# Patient Record
Sex: Female | Born: 1940
Health system: Southern US, Community
[De-identification: ages and names within clinical notes are randomized; demographics above are authoritative.]

## PROBLEM LIST (undated history)

## (undated) DIAGNOSIS — H35039 Hypertensive retinopathy, unspecified eye: Secondary | ICD-10-CM

## (undated) DIAGNOSIS — Z720 Tobacco use: Secondary | ICD-10-CM

## (undated) DIAGNOSIS — H348122 Central retinal vein occlusion, left eye, stable: Secondary | ICD-10-CM

## (undated) DIAGNOSIS — H3530 Unspecified macular degeneration: Secondary | ICD-10-CM

## (undated) DIAGNOSIS — R739 Hyperglycemia, unspecified: Secondary | ICD-10-CM

## (undated) DIAGNOSIS — K635 Polyp of colon: Secondary | ICD-10-CM

## (undated) DIAGNOSIS — I10 Essential (primary) hypertension: Secondary | ICD-10-CM

## (undated) DIAGNOSIS — E785 Hyperlipidemia, unspecified: Secondary | ICD-10-CM

## (undated) DIAGNOSIS — C801 Malignant (primary) neoplasm, unspecified: Secondary | ICD-10-CM

## (undated) HISTORY — DX: Hyperglycemia, unspecified: R73.9

## (undated) HISTORY — PX: CATARACT EXTRACTION, BILATERAL: SHX1313

## (undated) HISTORY — DX: Central retinal vein occlusion, left eye, stable: H34.8122

## (undated) HISTORY — DX: Malignant (primary) neoplasm, unspecified: C80.1

## (undated) HISTORY — PX: OTHER SURGICAL HISTORY: SHX169

## (undated) HISTORY — DX: Essential (primary) hypertension: I10

## (undated) HISTORY — DX: Hyperlipidemia, unspecified: E78.5

## (undated) HISTORY — DX: Unspecified macular degeneration: H35.30

## (undated) HISTORY — PX: CHOLECYSTECTOMY: SHX55

## (undated) HISTORY — DX: Tobacco use: Z72.0

## (undated) HISTORY — DX: Hypertensive retinopathy, unspecified eye: H35.039

## (undated) HISTORY — DX: Polyp of colon: K63.5

---

## 1998-07-24 DIAGNOSIS — C801 Malignant (primary) neoplasm, unspecified: Secondary | ICD-10-CM

## 1998-07-24 DIAGNOSIS — Z923 Personal history of irradiation: Secondary | ICD-10-CM

## 1998-07-24 DIAGNOSIS — C50919 Malignant neoplasm of unspecified site of unspecified female breast: Secondary | ICD-10-CM

## 1998-07-24 HISTORY — PX: BREAST LUMPECTOMY: SHX2

## 1998-07-24 HISTORY — PX: BREAST BIOPSY: SHX20

## 1998-07-24 HISTORY — DX: Personal history of irradiation: Z92.3

## 1998-07-24 HISTORY — DX: Malignant neoplasm of unspecified site of unspecified female breast: C50.919

## 1998-07-24 HISTORY — DX: Malignant (primary) neoplasm, unspecified: C80.1

## 2007-08-05 LAB — HM COLONOSCOPY: HM Colonoscopy: NORMAL

## 2007-09-09 LAB — CONVERTED CEMR LAB: Pap Smear: NORMAL

## 2008-07-31 ENCOUNTER — Encounter: Payer: Self-pay | Admitting: Family

## 2009-06-03 ENCOUNTER — Ambulatory Visit: Payer: Self-pay | Admitting: Family

## 2009-06-03 ENCOUNTER — Telehealth: Payer: Self-pay | Admitting: Family

## 2009-06-03 DIAGNOSIS — F17200 Nicotine dependence, unspecified, uncomplicated: Secondary | ICD-10-CM

## 2009-06-03 DIAGNOSIS — M810 Age-related osteoporosis without current pathological fracture: Secondary | ICD-10-CM

## 2009-06-03 DIAGNOSIS — Z853 Personal history of malignant neoplasm of breast: Secondary | ICD-10-CM | POA: Insufficient documentation

## 2009-06-03 LAB — CONVERTED CEMR LAB
BUN: 15 mg/dL (ref 6–23)
Basophils Absolute: 0 10*3/uL (ref 0.0–0.1)
CO2: 28 meq/L (ref 19–32)
Calcium: 9.4 mg/dL (ref 8.4–10.5)
Chloride: 106 meq/L (ref 96–112)
Creatinine, Ser: 0.9 mg/dL (ref 0.4–1.2)
Direct LDL: 192.5 mg/dL
Eosinophils Absolute: 0 10*3/uL (ref 0.0–0.7)
Glucose, Bld: 98 mg/dL (ref 70–99)
HDL: 52.5 mg/dL (ref >39.00)
Lymphocytes Relative: 27.3 % (ref 12.0–46.0)
MCHC: 34.5 g/dL (ref 30.0–36.0)
MCV: 96.8 fL (ref 78.0–100.0)
Monocytes Absolute: 0.4 10*3/uL (ref 0.1–1.0)
Neutrophils Relative %: 66.1 % (ref 43.0–77.0)
Platelets: 179 10*3/uL (ref 150.0–400.0)
RDW: 12.2 % (ref 11.5–14.6)
Triglycerides: 83 mg/dL (ref 0.0–149.0)

## 2009-06-04 ENCOUNTER — Telehealth (INDEPENDENT_AMBULATORY_CARE_PROVIDER_SITE_OTHER): Payer: Self-pay | Admitting: *Deleted

## 2009-06-23 ENCOUNTER — Ambulatory Visit: Payer: Self-pay | Admitting: Family

## 2009-06-23 DIAGNOSIS — E785 Hyperlipidemia, unspecified: Secondary | ICD-10-CM | POA: Insufficient documentation

## 2009-06-23 DIAGNOSIS — I10 Essential (primary) hypertension: Secondary | ICD-10-CM

## 2009-06-23 LAB — CONVERTED CEMR LAB
ALT: 10 units/L (ref 0–35)
AST: 12 units/L (ref 0–37)
Albumin: 3.8 g/dL (ref 3.5–5.2)
Alkaline Phosphatase: 51 units/L (ref 39–117)

## 2009-06-24 ENCOUNTER — Encounter: Payer: Self-pay | Admitting: Family

## 2009-08-12 ENCOUNTER — Encounter: Payer: Self-pay | Admitting: Family

## 2009-12-28 ENCOUNTER — Ambulatory Visit: Payer: Self-pay | Admitting: Family

## 2010-01-11 ENCOUNTER — Encounter: Payer: Self-pay | Admitting: Family

## 2010-01-11 LAB — CONVERTED CEMR LAB
Alkaline Phosphatase: 52 units/L (ref 39–117)
CO2: 26 meq/L (ref 19–32)
Cholesterol: 150 mg/dL (ref 0–200)
Creatinine, Ser: 0.87 mg/dL (ref 0.40–1.20)
Glucose, Bld: 108 mg/dL — ABNORMAL HIGH (ref 70–99)
LDL Cholesterol: 82 mg/dL (ref 0–99)
Sodium: 145 meq/L (ref 135–145)
Total Bilirubin: 0.4 mg/dL (ref 0.3–1.2)
Total CHOL/HDL Ratio: 2.8
Total Protein: 6.7 g/dL (ref 6.0–8.3)
Triglycerides: 74 mg/dL (ref <150)
VLDL: 15 mg/dL (ref 0–40)

## 2010-01-12 ENCOUNTER — Encounter: Payer: Self-pay | Admitting: Family

## 2010-03-07 ENCOUNTER — Ambulatory Visit: Payer: Self-pay | Admitting: Family

## 2010-03-07 ENCOUNTER — Ambulatory Visit: Payer: Self-pay | Admitting: Hematology & Oncology

## 2010-03-07 DIAGNOSIS — M129 Arthropathy, unspecified: Secondary | ICD-10-CM | POA: Insufficient documentation

## 2010-03-07 DIAGNOSIS — H9190 Unspecified hearing loss, unspecified ear: Secondary | ICD-10-CM | POA: Insufficient documentation

## 2010-03-08 ENCOUNTER — Ambulatory Visit: Payer: Self-pay | Admitting: Hematology & Oncology

## 2010-03-09 ENCOUNTER — Encounter: Payer: Self-pay | Admitting: Family

## 2010-03-09 ENCOUNTER — Ambulatory Visit: Payer: Self-pay | Admitting: Internal Medicine

## 2010-03-10 ENCOUNTER — Encounter: Payer: Self-pay | Admitting: Internal Medicine

## 2010-03-21 ENCOUNTER — Telehealth: Payer: Self-pay | Admitting: Family

## 2010-03-30 ENCOUNTER — Telehealth: Payer: Self-pay | Admitting: Family

## 2010-03-31 ENCOUNTER — Telehealth: Payer: Self-pay | Admitting: Family

## 2010-04-01 ENCOUNTER — Ambulatory Visit: Payer: Self-pay | Admitting: Internal Medicine

## 2010-04-01 DIAGNOSIS — J018 Other acute sinusitis: Secondary | ICD-10-CM

## 2010-04-29 ENCOUNTER — Ambulatory Visit: Payer: Self-pay | Admitting: Hematology & Oncology

## 2010-05-02 ENCOUNTER — Ambulatory Visit: Payer: Self-pay | Admitting: Family

## 2010-05-02 LAB — CBC WITH DIFFERENTIAL (CANCER CENTER ONLY)
BASO%: 0.5 % (ref 0.0–2.0)
EOS%: 1 % (ref 0.0–7.0)
HCT: 39.3 % (ref 34.8–46.6)
LYMPH%: 26.5 % (ref 14.0–48.0)
MCH: 31.4 pg (ref 26.0–34.0)
MCHC: 33.5 g/dL (ref 32.0–36.0)
MCV: 94 fL (ref 81–101)
MONO%: 5.3 % (ref 0.0–13.0)
NEUT%: 66.7 % (ref 39.6–80.0)
Platelets: 214 10*3/uL (ref 145–400)
RDW: 11.1 % (ref 10.5–14.6)

## 2010-05-02 LAB — COMPREHENSIVE METABOLIC PANEL
ALT: <8 U/L (ref 0–35)
AST: 11 U/L (ref 0–37)
Alkaline Phosphatase: 53 U/L (ref 39–117)
CO2: 25 mEq/L (ref 19–32)
Creatinine, Ser: 0.81 mg/dL (ref 0.40–1.20)
Sodium: 142 mEq/L (ref 135–145)
Total Bilirubin: 0.5 mg/dL (ref 0.3–1.2)

## 2010-05-10 LAB — BASIC METABOLIC PANEL - CANCER CENTER ONLY
BUN, Bld: 19 mg/dL (ref 7–22)
CO2: 28 mEq/L (ref 18–33)
Calcium: 8.7 mg/dL (ref 8.0–10.3)
Glucose, Bld: 113 mg/dL (ref 73–118)
Sodium: 146 mEq/L — ABNORMAL HIGH (ref 128–145)

## 2010-06-27 ENCOUNTER — Ambulatory Visit: Payer: Self-pay | Admitting: Family

## 2010-08-12 LAB — HM MAMMOGRAPHY: HM Mammogram: NORMAL

## 2010-08-23 NOTE — Assessment & Plan Note (Signed)
Summary: flu shot/mhf  Nurse Visit   Vital Signs:  Patient profile:   70 year old female Temp:     97.9 degrees F oral  Vitals Entered By: Mervin Kung CMA Duncan Dull) (May 02, 2010 2:07 PM)  Allergies: No Known Drug Allergies  Orders Added: 1)  Flu Vaccine 26yrs + MEDICARE PATIENTS [Q2039] 2)  Administration Flu vaccine - MCR [G0008]  Flu Vaccine Consent Questions     Do you have a history of severe allergic reactions to this vaccine? no    Any prior history of allergic reactions to egg and/or gelatin? no    Do you have a sensitivity to the preservative Thimersol? no    Do you have a past history of Guillan-Barre Syndrome? no    Do you currently have an acute febrile illness? no    Have you ever had a severe reaction to latex? no    Vaccine information given and explained to patient? yes    Are you currently pregnant? no    Lot Number:[id number]   Exp Date:01/21/2011   Site Given  Left Deltoid IM.  Nicki Guadalajara Fergerson CMA Duncan Dull)  May 02, 2010 2:07 PM

## 2010-08-23 NOTE — Consult Note (Signed)
Summary: [facility]  [facility]   Imported By: Lanelle Bal 03/18/2010 10:30:56  _____________________________________________________________________  External Attachment:    Type:   Image     Comment:   External Document

## 2010-08-23 NOTE — Assessment & Plan Note (Signed)
Summary: medicare wellness exam / tf,cma   Vital Signs:  Patient profile:   70 year old female Height:      60.5 inches (153.67 cm) Weight:      141.25 pounds (64.20 kg) BMI:     27.23 Temp:     98.1 degrees F (36.72 degrees C) oral BP sitting:   118 / 68  (right arm) Cuff size:   regular  Vitals Entered By: Brenton Grills MA (March 07, 2010 8:14 AM) CC: Medicare Wellness Exam/aj Is Patient Diabetic? No Pain Assessment Patient in pain? yes     Location: foot Intensity: 7 Type: aching Nutritional Status BMI of 25 - 29 = overweight Nutritional Status Detail Trying to eat a healthy diet Domestic Violence Intervention denies  Does patient need assistance? Functional Status Self care, Cook/clean, Shopping, Social activities Ambulation Normal Comments Pt is no longer usig Nicoderm patch  Vision Screening:Left eye with correction: 20 / 40 Right eye with correction: 20 / 30 Both eyes with correction: 20 / 40        Vision Entered By: Brenton Grills MA (March 07, 2010 8:50 AM)   CC:  Medicare Wellness Exam/aj.  History of Present Illness: Judith Flynn is a 70 year old female who presents today for her medicare wellness exam.  Here for Medicare AWV: last eye exam was may  1.   Risk factors based on Past M, S, F history: + history of breast CA- she was seeing Dr. Waldemar Dickens (oncology) in [place]- but has not established with a local oncologist, was seeing (Dr Lawerance Bach) GI for colonoscopies + hx or polyps- due for colo next year (gastroenterology), she will consider varivax 2.   Physical Activities: walks the dog- no formal exercise, but plans to look into "silver sneakers" with the [employer] 3.   Depression/mood: Reports "down at times" but usually mood is good.   4.   Hearing: patient wears two hearing aids- has difficult time with a soft tone (difficulty hearing soft speech at 4 feet) cognitive impairment-  oriented to person,  year, place, denies difficulty with reading or writing.   Good 3 minute recall 5.   ADL's:  perfoms ADL's without problems.  6.   Fall Risk: none 7.   Home Safety: no issues 8.   Height, weight, &visual acuity:  see vitals 9.   Counseling: patient was counseled on diet, exercise, weight loss,  and smoking cessation 10.   Labs ordered based on risk factors: + hx or osteoporosis- check vitamin D, + joint pain- will check uric acid.  11.           Referral Coordination-  will plan referral to oncology for monitoring (Ennever)  Will also send for  audiology evaluation 12.           Care Plan- will repeat dexascan due to hx of osteoporosis (last dexa was 2 years ago) Also check vitamin D, refer to Oncology for ongoing follow up given history of breast CA (2000).  Will arrange follow up with Audiologist due to hearing difficulty.  Pt to schedule a follow up apt for Pap smear.  13.            Cognitive Assessment- normal functioning for age.  Denies difficulty with reading or writing,    Preventive Screening-Counseling & Management  Alcohol-Tobacco     Tobacco Counseling: to quit use of tobacco products  Comments: using 10 cigarettes a day  Current Medications (verified): 1)  Alendronate Sodium 70 Mg  Tabs (Alendronate Sodium) .Marland Kitchen.. 1 Per Week By Mouth 2)  Omeprazole 20 Mg Cpdr (Omeprazole) .Marland Kitchen.. 1-2 Daily By Mouth 3)  Calcium 600 Mg Tabs (Calcium) .... 2 Per Day By Mouth 4)  Crestor 20 Mg Tabs (Rosuvastatin Calcium) .... One Tablet By Mouth At Bedtime 5)  Zestril 10 Mg Tabs (Lisinopril) .... One Tab By Mouth Daily 6)  Nicoderm Cq 21 Mg/24hr Pt24 (Nicotine) .... Apply One Patch Daily  Allergies (verified): No Known Drug Allergies  Past History:  Past Surgical History: breast biopsy in 2000 surgery on R ear drum  Lumpectomy left 2000  Family History: Reviewed history from 06/03/2009 and no changes required. Family History of Alcoholism/Addiction-parents Family History of Arthritis-parents Family History Hypertension-parents both in their  78's Mom- Htn, CHF, died age 89 Dad- MI died 33  Review of Systems       Constitutional: Denies Fever ENT:  Denies nasal congestion or sore throat. Resp: Denies cough CV:  Denies Chest Pain GI:  Denies nausea or vomitting GU: Denies dysuria Lymphatic: Denies lymphadenopathy Musculoskeletal: some pain in her hands and base of right great toe Skin:  Denies Rashes Psychiatric: Denies depression Neuro: Denies numbness     Physical Exam  General:  Well-developed,well-nourished,in no acute distress; alert,appropriate and cooperative throughout examination Lungs:  Normal respiratory effort, chest expands symmetrically. Lungs are clear to auscultation, no crackles or wheezes. Heart:  Normal rate and regular rhythm. S1 and S2 normal without gallop, murmur, click, rub or other extra sounds. Abdomen:  Bowel sounds positive,abdomen soft and non-tender without masses, organomegaly or hernias noted. Neurologic:  alert & oriented X3 and cranial nerves II-XII intact.  Difficulty hearing soft spoken voice in exam room.     Impression & Recommendations:  Problem # 1:  OSTEOPOROSIS (ICD-733.00) Assessment Comment Only Continue Fosamax,  check vitamin D level, pt to schedule follow up dexascan Her updated medication list for this problem includes:    Alendronate Sodium 70 Mg Tabs (Alendronate sodium) .Marland Kitchen... 1 per week by mouth  Orders: T-Vitamin D (25-Hydroxy) [phone])  Problem # 2:  PERSONAL HISTORY OF MALIGNANT NEOPLASM OF BREAST (ICD-V10.3) Assessment: Comment Only  Pt was following with oncology in [place].  Had reportedly normal mammogram in January of this year.  Will refer to Dr. Myna Hidalgo to establish with local oncologist.  Orders: Oncology Referral (Oncology)  Problem # 3:  UNSPECIFIED HEARING LOSS (ICD-389.9)  Orders: Audiology (Audio)  Complete Medication List: 1)  Alendronate Sodium 70 Mg Tabs (Alendronate sodium) .Marland Kitchen.. 1 per week by mouth 2)  Omeprazole 20 Mg Cpdr  (Omeprazole) .Marland Kitchen.. 1-2 daily by mouth 3)  Calcium 600 Mg Tabs (Calcium) .... 2 per day by mouth 4)  Crestor 20 Mg Tabs (Rosuvastatin calcium) .... One tablet by mouth at bedtime 5)  Zestril 10 Mg Tabs (Lisinopril) .... One tab by mouth daily 6)  Nicoderm Cq 21 Mg/24hr Pt24 (Nicotine) .... Apply one patch daily  Other Orders: T-Uric Acid (Blood) [phone]) Medicare -1st Annual Wellness Visit [phone])  Patient Instructions: 1)  Please arrange a bone density test at the front desk.   2)  Please arrange a follow up visit for a PAP smear.   3)  You will be contacted about your referral to Oncology.   4)  You will be contacted about your referral to audiology. 5)  Please complete your lab work downstairs. 6)  Follow up in 3 months.   Preventive Care Screening  Pap Smear:    Next Due:  09/2008  Mammogram:  Date:  08/02/2009    Next Due:  07/2010    Results:  normal   Colonoscopy:    Next Due:  07/2012

## 2010-08-23 NOTE — Assessment & Plan Note (Signed)
Summary: 3 month follow up/mhf rsc with pt/mhf   Vital Signs:  Patient profile:   70 year old female Height:      60.5 inches Weight:      146.25 pounds BMI:     28.19 O2 Sat:      98 % on Room air Temp:     98.3 degrees F oral Pulse rate:   95 / minute Resp:     20 per minute BP sitting:   134 / 70  (right arm) Cuff size:   regular  Vitals Entered By: Glendell Docker CMA (June 27, 2010 10:09 AM)  O2 Flow:  Room air CC: 3 Month follow up  Is Patient Diabetic? No Pain Assessment Patient in pain? no      Comments lisinopril caused cough, refill  on omeprazole . crestor,    Primary Care Provider:  Lemont Fillers FNP  CC:  3 Month follow up .  History of Present Illness: Patient is a 70 year old female who presents today for routine follow up.    1) HTN- stopped the zestril several weeks ago due to severe cough.  Now resolved.  Denies HA, Swelling, Chest pain.    2) Hyperlipidemia- Tolerating crestor without myalgias.  3) Tobacco abuse - 1/3 PPD, not yet motivated to quit.  Did not try the patch.       Preventive Screening-Counseling & Management  Alcohol-Tobacco     Smoking Status: current  Allergies (verified): 1)  Lisinopril (Lisinopril)  Past History:  Past Medical History: Last updated: 04/01/2010 history of colon polyps hearing loss osteoporosis tobacco abuse Breast cancer - left breast 2000 (s/p lumpectomy and radiation)  Past Surgical History: Last updated: 03/07/2010 breast biopsy in 2000 surgery on R ear drum  Lumpectomy left 2000  Review of Systems       see HPI  Physical Exam  General:  Well-developed,well-nourished,in no acute distress; alert,appropriate and cooperative throughout examination Lungs:  Normal respiratory effort, chest expands symmetrically. Lungs are clear to auscultation, no crackles or wheezes. Heart:  Normal rate and regular rhythm. S1 and S2 normal without gallop, murmur, click, rub or other extra  sounds. Extremities:  No clubbing, cyanosis, edema, or deformity noted    Impression & Recommendations:  Problem # 1:  HYPERTENSION (ICD-401.9) Assessment Comment Only BP is stable despite pt's dicontinuation of ACE.  Will plan to continue off of ACE and will monitor.   Her updated medication list for this problem includes:    Zestril 10 Mg Tabs (Lisinopril) ..... One tab by mouth daily  BP today: 134/70 Prior BP: 130/80 (04/01/2010)  Labs Reviewed: K+: 4.5 (01/11/2010) Creat: : 0.87 (01/11/2010)   Chol: 150 (01/11/2010)   HDL: 53 (01/11/2010)   LDL: 82 (01/11/2010)   TG: 74 (01/11/2010)  Problem # 2:  HYPERLIPIDEMIA (ICD-272.4) Assessment: Comment Only  Tolerating crestor. Continue same. Her updated medication list for this problem includes:    Crestor 20 Mg Tabs (Rosuvastatin calcium) ..... One tablet by mouth at bedtime  Orders: Prescription Created Electronically [phone])  Problem # 3:  TOBACCO ABUSE (ICD-305.1) Assessment: Comment Only  Discussed risks of long term smoking and failure to quit with patient.   The following medications were removed from the medication list:    Nicoderm Cq 21 Mg/24hr Pt24 (Nicotine) .Marland Kitchen... Apply one patch daily  Orders: Tobacco use cessation intermediate 3-10 minutes (99406)  Problem # 4:  OSTEOPOROSIS (ICD-733.00) Assessment: New Patient continue vitamin D supplementation (total 3000 units/day) for  mild vitamin D deficiency.  Continues Calcium supplement. Had Reclast in November- tolerated well.   The following medications were removed from the medication list:    Alendronate Sodium 70 Mg Tabs (Alendronate sodium) .Marland Kitchen... 1 per week by mouth Her updated medication list for this problem includes:    Reclast 5 Mg/146ml Soln (Zoledronic acid) .Marland Kitchen... Annual infusion  Complete Medication List: 1)  Omeprazole 20 Mg Cpdr (Omeprazole) .Marland Kitchen.. 1-2 daily by mouth 2)  Calcium 1200 1200-1000 Mg-unit Chew (Calcium carbonate-vit d-min) .... Take 1  tablet by mouth once a day 3)  Crestor 20 Mg Tabs (Rosuvastatin calcium) .... One tablet by mouth at bedtime 4)  Zestril 10 Mg Tabs (Lisinopril) .... One tab by mouth daily 5)  Vitamin D 2000 Unit Tabs (Cholecalciferol) .... Take 1 tablet by mouth once a day 6)  Tylenol 325 Mg Tabs (Acetaminophen) .... 2 tabs by mouth every 6 hours as needed 7)  Reclast 5 Mg/1108ml Soln (Zoledronic acid) .... Annual infusion  Patient Instructions: 1)  Please follow up in 3 month. 2)  Have a great Christmas! Prescriptions: CRESTOR 20 MG TABS (ROSUVASTATIN CALCIUM) one tablet by mouth at bedtime  #90 x 1   Entered and Authorized by:   Lemont Fillers FNP   Signed by:   Lemont Fillers FNP on 06/27/2010   Method used:   Faxed to ...       [facility] (mail-order)       [address]       [place], [place]  [id number]       Ph: [phone]       Fax: [phone]   RxID:   [id number] OMEPRAZOLE 20 MG CPDR (OMEPRAZOLE) 1-2 daily by mouth  #90 x 1   Entered and Authorized by:   Lemont Fillers FNP   Signed by:   Lemont Fillers FNP on 06/27/2010   Method used:   Faxed to ...       [facility] (mail-order)       [address]       [place], [place]  [id number]       Ph: [phone]       Fax: [phone]   RxID:   [id number]    Orders Added: 1)  Est. Patient Level III [25427] 2)  Tobacco use cessation intermediate 3-10 minutes [99406] 3)  Prescription Created Electronically [phone]    Current Allergies (reviewed today): LISINOPRIL (LISINOPRIL)

## 2010-08-23 NOTE — Consult Note (Signed)
Summary: [facility]  [facility]   Imported By: Lanelle Bal 06/09/2010 08:07:21  _____________________________________________________________________  External Attachment:    Type:   Image     Comment:   External Document

## 2010-08-23 NOTE — Progress Notes (Signed)
Summary: faxed req for records to [facility]   Phone Note Other Incoming   Caller: patient Summary of Call: patient came in and signed medical release form for records from [facility]. Faxed request for records.  Initial call taken by: Roselle Locus,  March 30, 2010 10:52 AM

## 2010-08-23 NOTE — Assessment & Plan Note (Signed)
Summary: SINUS  INFECTION/HEA   Vital Signs:  Patient profile:   70 year old female Height:      60.5 inches Weight:      143 pounds BMI:     27.57 O2 Sat:      96 % on Room air Temp:     98.0 degrees F oral Pulse rate:   82 / minute Pulse rhythm:   regular Resp:     16 per minute BP sitting:   130 / 80  (right arm) Cuff size:   regular  Vitals Entered By: Glendell Docker CMA (April 01, 2010 8:02 AM)  O2 Flow:  Room air CC: Sinus congestion, URI symptoms Is Patient Diabetic? No Pain Assessment Patient in pain? no      Comments onset Wednesday, sinus congestion, tylenol with no relief, sinus draining into chest   Primary Care Provider:  Lemont Fillers FNP  CC:  Sinus congestion and URI symptoms.  History of Present Illness:  URI Symptoms      This is a 70 year old woman who presents with URI symptoms.  The patient reports nasal congestion, clear nasal discharge, and dry cough.  The patient denies fever.  symptoms started on Wed. This AM she saw some yellowish mucus when she blew her nose but then it cleared.  she is smoker she denies SOB planning to use nicotine patches to quit smoking   Preventive Screening-Counseling & Management  Alcohol-Tobacco     Smoking Status: current  Allergies (verified): No Known Drug Allergies  Past History:  Past Medical History: history of colon polyps hearing loss osteoporosis tobacco abuse Breast cancer - left breast 2000 (s/p lumpectomy and radiation)  Family History: Family History of Alcoholism/Addiction-parents Family History of Arthritis-parents Family History Hypertension-parents both in their 32's Mom- Htn, CHF, died age 98 Dad- MI died 56   Social History: 3 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   Ears:  R ear  normal and L ear normal.  bil hearing aids Lungs:  normal respiratory effort and normal breath sounds.   Heart:  normal rate, regular rhythm, and no gallop.     Impression & Recommendations:  Problem # 1:  RHINOSINUSITIS, ACUTE (ICD-461.8) pt with 2-3 days of nasal congestion and headache.  we discussed probability that symptoms likely from viral or allergic cause. pt will try neil med sinus rinse and sample of veramyst we discussed signs and symptoms of bacterial sinusitis.  she will be traveling to [place] to visit her sister. use ceftin if symptoms get worse  Her updated medication list for this problem includes:    Cefuroxime Axetil 500 Mg Tabs (Cefuroxime axetil) ..... One by mouth two times a day    Veramyst 27.5 Mcg/spray Susp (Fluticasone furoate) .Marland Kitchen... 2 sprays each nostril once daily  Complete Medication List: 1)  Alendronate Sodium 70 Mg Tabs (Alendronate sodium) .Marland Kitchen.. 1 per week by mouth 2)  Omeprazole 20 Mg Cpdr (Omeprazole) .Marland Kitchen.. 1-2 daily by mouth 3)  Calcium 1200 1200-1000 Mg-unit Chew (Calcium carbonate-vit d-min) .... Take 1 tablet by mouth once a day 4)  Crestor 20 Mg Tabs (Rosuvastatin calcium) .... One tablet by mouth at bedtime 5)  Zestril 10 Mg Tabs (Lisinopril) .... One tab by mouth daily 6)  Nicoderm Cq 21 Mg/24hr Pt24 (Nicotine) .... Apply one patch daily 7)  Cefuroxime Axetil  500 Mg Tabs (Cefuroxime axetil) .... One by mouth two times a day 8)  Veramyst 27.5 Mcg/spray Susp (Fluticasone furoate) .... 2 sprays each nostril once daily  Patient Instructions: 1)  Call our office if your symptoms do not  improve or gets worse. Prescriptions: CEFUROXIME AXETIL 500 MG TABS (CEFUROXIME AXETIL) one by mouth two times a day  #20 x 0   Entered and Authorized by:   D. Thomos Lemons DO   Signed by:   D. Thomos Lemons DO on 04/01/2010   Method used:   Print then Give to Patient   RxID:   [id number]   Current Allergies (reviewed today): No known allergies

## 2010-08-23 NOTE — Assessment & Plan Note (Signed)
Summary: follow up from new visit/mhf--rm 5   Vital Signs:  Patient profile:   70 year old female Height:      60.5 inches Weight:      143.75 pounds BMI:     27.71 Temp:     98.0 degrees F oral Pulse rate:   78 / minute Pulse rhythm:   regular Resp:     16 per minute BP sitting:   146 / 84  (right arm) Cuff size:   regular  Vitals Entered By: Mervin Kung CMA (December 28, 2009 8:30 AM) CC: ROOM 5   Follow up. Pt needs refills on all meds faxed  to [facility].  Pt also needs 30 days to local pharmacy. Pt had to stop Chantix due to  nausea and would like to discuss an alternative patch.   CC:  ROOM 5   Follow up. Pt needs refills on all meds faxed  to [facility].  Pt also needs 30 days to local pharmacy. Pt had to stop Chantix due to  nausea and would like to discuss an alternative patch..  History of Present Illness: Judith Flynn is a 70 year old female who presents today for follow up.  1)Tobacco Abuse- discontinued Chantix due to nausea.  Wants to try nicotine patch.  She smokes 10-15 cigarettes a day. No longer smoking in the house.    2)  HTN- BP is up today.  Ran out of her meds.    3) GERD- reports that reflux is well controlled on omeprazole.     Allergies (verified): No Known Drug Allergies  Physical Exam  General:  Well-developed,well-nourished,in no acute distress; alert,appropriate and cooperative throughout examination Ears:  L ear normal.   Lungs:  Normal respiratory effort, chest expands symmetrically. Lungs are clear to auscultation, no crackles or wheezes. Heart:  Normal rate and regular rhythm. S1 and S2 normal without gallop, murmur, click, rub or other extra sounds. Psych:  Cognition and judgment appear intact. Alert and cooperative with normal attention span and concentration. No apparent delusions, illusions, hallucinations   Impression & Recommendations:  Problem # 1:  HYPERLIPIDEMIA (ICD-272.4) Assessment Comment Only Patient is not fasting  today- she will return for fasting blood work. Her updated medication list for this problem includes:    Crestor 20 Mg Tabs (Rosuvastatin calcium) ..... One tablet by mouth at bedtime  Orders: Prescription Created Electronically (G8553)Future Orders: T-Comprehensive Metabolic Panel [id number] [phone]) ... 12/29/2009 T-Lipid Profile [id number]) ... 12/29/2009  Problem # 2:  TOBACCO ABUSE (ICD-305.1) Assessment: Unchanged  Patient wishes to start nicoderm patch.  Patient counseled on smoking cessation.   The following medications were removed from the medication list:    Chantix Starting Month Pak 0.5 Mg X 11 & 1 Mg X 42 Tabs (Varenicline tartrate) .Marland Kitchen... Take as directed Her updated medication list for this problem includes:    Nicoderm Cq 21 Mg/24hr Pt24 (Nicotine) .Marland Kitchen... Apply one patch daily  Orders: Tobacco use cessation intermediate 3-10 minutes (41660)  Problem # 3:  HYPERTENSION (ICD-401.9) Assessment: Deteriorated Patient ran out of her meds- plan to resume medications.  Patient to return for lab work.   Her updated medication list for this problem includes:    Zestril 10 Mg Tabs (Lisinopril) ..... One tab by mouth daily  Future Orders: T-Comprehensive Metabolic Panel [id number]) ... 12/29/2009  Complete Medication List: 1)  Alendronate Sodium 70 Mg Tabs (Alendronate sodium) .Marland Kitchen.. 1 per week by mouth 2)  Omeprazole 20 Mg Cpdr (Omeprazole) .Marland Kitchen.. 1-2  daily by mouth 3)  Calcium 600 Mg Tabs (Calcium) .... 2 per day by mouth 4)  Crestor 20 Mg Tabs (Rosuvastatin calcium) .... One tablet by mouth at bedtime 5)  Zestril 10 Mg Tabs (Lisinopril) .... One tab by mouth daily 6)  Nicoderm Cq 21 Mg/24hr Pt24 (Nicotine) .... Apply one patch daily  Patient Instructions: 1)  Please return fasting for blood work.   2)  Schedule a Medicare Wellness Visit this summer at your convenience. 3)  Nicotine patch Dose: 21 mg patch qd x6wk, then 14 mg patch once daily x2wk, then 7 mg patch qd x2wk;  Info: stop cigarette use at tx onset Prescriptions: NICODERM CQ 21 MG/24HR PT24 (NICOTINE) apply one patch daily  #30 x 0   Entered and Authorized by:   Lemont Fillers FNP   Signed by:   Lemont Fillers FNP on 12/28/2009   Method used:   Electronically to        [facility] [phone]* (retail)       [address]       [place], [place]  [address]       Ph: [phone]       Fax: [phone]   RxID:   [id number] ZESTRIL 10 MG TABS (LISINOPRIL) one tab by mouth daily  #30 x 0   Entered and Authorized by:   Lemont Fillers FNP   Signed by:   Lemont Fillers FNP on 12/28/2009   Method used:   Electronically to        [facility] [phone]* (retail)       [address]       [place], [place]  [address]       Ph: [phone]       Fax: [phone]   RxID:   [id number] CRESTOR 20 MG TABS (ROSUVASTATIN CALCIUM) one tablet by mouth at bedtime  #30 x 0   Entered and Authorized by:   Lemont Fillers FNP   Signed by:   Lemont Fillers FNP on 12/28/2009   Method used:   Electronically to        [facility] [phone]* (retail)       [address]       [place], [place]  [address]       Ph: [phone]       Fax: [phone]   RxID:   [id number] OMEPRAZOLE 20 MG CPDR (OMEPRAZOLE) 1-2 daily by mouth  #30 x 0   Entered and Authorized by:   Lemont Fillers FNP   Signed by:   Lemont Fillers FNP on 12/28/2009   Method used:   Electronically to        [facility] [phone]* (retail)       [address]       [place], [place]  [address]       Ph: [phone]       Fax: [phone]   RxID:   [id number] ALENDRONATE SODIUM 70 MG TABS (ALENDRONATE SODIUM) 1 per week by mouth  #4 x 0   Entered and Authorized by:   Lemont Fillers FNP   Signed by:   Lemont Fillers FNP on 12/28/2009   Method used:   Electronically  to  [facility] [phone]* (retail)       [address]       [place], [place]  [address]       Ph: [phone]       Fax: [phone]   RxID:   [id number] OMEPRAZOLE 20 MG CPDR (OMEPRAZOLE) 1-2 daily by mouth  #90 x 1   Entered and Authorized by:   Lemont Fillers FNP   Signed by:   Lemont Fillers FNP on 12/28/2009   Method used:   Print then Give to Patient   RxID:   [id number] ZESTRIL 10 MG TABS (LISINOPRIL) one tab by mouth daily  #90 x 1   Entered and Authorized by:   Lemont Fillers FNP   Signed by:   Lemont Fillers FNP on 12/28/2009   Method used:   Print then Give to Patient   RxID:   [id number] CRESTOR 20 MG TABS (ROSUVASTATIN CALCIUM) one tablet by mouth at bedtime  #90 x 1   Entered and Authorized by:   Lemont Fillers FNP   Signed by:   Lemont Fillers FNP on 12/28/2009   Method used:   Print then Give to Patient   RxID:   [id number] ALENDRONATE SODIUM 70 MG TABS (ALENDRONATE SODIUM) 1 per week by mouth  #12 x 1   Entered and Authorized by:   Lemont Fillers FNP   Signed by:   Lemont Fillers FNP on 12/28/2009   Method used:   Print then Give to Patient   RxID:   [id number]   Current Allergies (reviewed today): No known allergies

## 2010-08-23 NOTE — Letter (Signed)
    at [facility] [address] [place], [place]  [address]  [place] Phone: [phone]      January 17, 2010   Judith Flynn [address] [place], [place] [address]  RE:  LAB RESULTS  Dear  [name],  The following is an interpretation of your most recent lab tests.  Please take note of any instructions provided or changes to medications that have resulted from your lab work.  ELECTROLYTES:  Good - no changes needed  KIDNEY FUNCTION TESTS:  Good - no changes needed  LIPID PANEL:  Good - no changes needed Triglyceride: 74   Cholesterol: 150   LDL: 82   HDL: 53   Chol/HDL%:  2.8 Ratio   Sincerely Yours,    Lemont Fillers FNP

## 2010-08-23 NOTE — Progress Notes (Signed)
Summary: Bone density result  Phone Note Outgoing Call   Summary of Call: Left message for pt to return my call.  When pt calls back I would like to let her know that her bone density is showing osteoporosis. She needs to quit smoking as this is very bad for her bones.  Did she complete the vitamin D and uric acid levels ordered last visit?  If not I would like for her to do so pls.  Also we still have not received records from her old physician.  I would like to obtain old bone density for comparison.  Pls have pt fill out medical release when she comes back for labs. thanks Initial call taken by: Lemont Fillers FNP,  March 21, 2010 2:16 PM  Follow-up for Phone Call        Pt notified.  She states that she didn't know she was supposed to have labs drawn at last visit but she will have them done next Tuesday. Order reprinted and faxed to the lab. Advised pt to come upstairs to sign records release when she comes for lab draw.  Nicki Guadalajara Fergerson CMA Duncan Dull)  March 21, 2010 3:01 PM

## 2010-08-23 NOTE — Miscellaneous (Signed)
Summary: BONE DENSITY  Clinical Lists Changes  Orders: Added new Test order of T-Bone Densitometry (77080) - Signed Added new Test order of T-Lumbar Vertebral Assessment (77082) - Signed 

## 2010-08-23 NOTE — Progress Notes (Signed)
Summary: vit d supplement  Phone Note Outgoing Call   Summary of Call: Please call patient and let her know that her vitamin D is a little low.  She should take caltrate with vitamin D twice daily.  I am not sure if the supplement that she was taking included vitamin D.  Thanks. Initial call taken by: Lemont Fillers FNP,  March 31, 2010 2:27 PM  Follow-up for Phone Call        Advised pt per [facility] instructions. Pt states she takes a new slow release calcium supplement with Vit c and 600mg  of vit d that she takes once a day. Advised pt to continue current med.  Pt will be going out of town for a while and we can reach her on her cell [phone] with any changes if needed. Please advise. Nicki Guadalajara Fergerson CMA Duncan Dull)  March 31, 2010 3:39 PM   Additional Follow-up for Phone Call Additional follow up Details #1::        Please call patient and let her know that I would like for her to add additional vitamin D as below.  Needs f/u vitamin D level in 3 months. Additional Follow-up by: Lemont Fillers FNP,  April 03, 2010 8:48 PM    Additional Follow-up for Phone Call Additional follow up Details #2::    Pt advised per Melissa's directions and voices understanding. Pt states that she has a follow up with [place] in November and she will discuss need for repeat Vit D level at that visit. Nicki Guadalajara Fergerson CMA Duncan Dull)  April 04, 2010 11:51 AM   New/Updated Medications: CALTRATE 600+D 600-400 MG-UNIT TABS (CALCIUM CARBONATE-VITAMIN D) one tablet by mouth two times a day VITAMIN D 1000 UNIT CAPS (CHOLECALCIFEROL) 2 caps by mouth daily

## 2010-09-01 ENCOUNTER — Telehealth: Payer: Self-pay | Admitting: Family

## 2010-09-02 ENCOUNTER — Encounter: Payer: Self-pay | Admitting: Family

## 2010-09-02 ENCOUNTER — Telehealth: Payer: Self-pay | Admitting: Family

## 2010-09-08 NOTE — Progress Notes (Signed)
Summary: Omeprazole Refill  Phone Note Refill Request Message from:  Fax from Pharmacy on September 01, 2010 2:58 PM  Refills Requested: Medication #1:  OMEPRAZOLE 20 MG CPDR 1-2 daily by mouth   Dosage confirmed as above?Dosage Confirmed   Brand Name Necessary? No   Supply Requested: 3 months [facility] [phone]   Method Requested: Fax to Mail Away Pharmacy Next Appointment Scheduled: 10/07/2010 Initial call taken by: Glendell Docker CMA,  September 01, 2010 2:58 PM  Follow-up for Phone Call        Rx completed in Dr. Tiajuana Amass Follow-up by: Glendell Docker CMA,  September 01, 2010 3:00 PM    Prescriptions: OMEPRAZOLE 20 MG CPDR (OMEPRAZOLE) 1-2 daily by mouth  #90 x 0   Entered by:   Glendell Docker CMA   Authorized by:   Lemont Fillers FNP   Signed by:   Glendell Docker CMA on 09/01/2010   Method used:   Faxed to ...       [facility] (mail-order)       [address]       [place], [place]  [id number]       Ph: [phone]       Fax: [phone]   RxID:   [id number]

## 2010-09-08 NOTE — Progress Notes (Signed)
Summary: mammogram results  Phone Note Call from Patient   Caller: Patient Call For: Lemont Fillers FNP Summary of Call: Received message from pt stating she is due for her mammogram when she sees Tamaria Dunleavy in march and wanted [place] to call [facility] in [place]. [phone] to obtain copies of her previous mammogram. Records requested and received. Copies forwarded to Provider for review.  Initial call taken by: Mervin Kung CMA Duncan Dull),  September 02, 2010 12:07 PM

## 2010-09-08 NOTE — Miscellaneous (Signed)
  Clinical Lists Changes  Observations: Added new observation of MAMMOGRAM: normal (08/12/2010 15:55) Added new observation of MAMMOGRAM: normal (08/12/2009 15:56)      Preventive Care Screening  Mammogram:    Date:  08/12/2009    Results:  normal

## 2010-10-03 ENCOUNTER — Telehealth: Payer: Self-pay | Admitting: Family

## 2010-10-03 DIAGNOSIS — E559 Vitamin D deficiency, unspecified: Secondary | ICD-10-CM | POA: Insufficient documentation

## 2010-10-03 LAB — CONVERTED CEMR LAB: Vit D, 1,25-Dihydroxy: 42 (ref 30–89)

## 2010-10-07 ENCOUNTER — Telehealth: Payer: Self-pay | Admitting: Family

## 2010-10-07 ENCOUNTER — Ambulatory Visit (INDEPENDENT_AMBULATORY_CARE_PROVIDER_SITE_OTHER): Payer: PRIVATE HEALTH INSURANCE | Admitting: Family

## 2010-10-07 ENCOUNTER — Encounter: Payer: Self-pay | Admitting: Family

## 2010-10-07 DIAGNOSIS — I10 Essential (primary) hypertension: Secondary | ICD-10-CM

## 2010-10-07 DIAGNOSIS — E785 Hyperlipidemia, unspecified: Secondary | ICD-10-CM

## 2010-10-07 DIAGNOSIS — E559 Vitamin D deficiency, unspecified: Secondary | ICD-10-CM

## 2010-10-11 NOTE — Assessment & Plan Note (Signed)
Summary: 3 MONTH FU/MHF--rm 5   Vital Signs:  Patient profile:   70 year old female Height:      60.5 inches Weight:      161 pounds BMI:     31.04 Temp:     98.2 degrees F oral Pulse rate:   94 / minute Pulse rhythm:   regular Resp:     18 per minute BP sitting:   136 / 76  (right arm) Cuff size:   regular  Vitals Entered By: Mervin Kung CMA (AAMA) (October 07, 2010 9:30 AM) CC: 3 month follow up. Is Patient Diabetic? No Pain Assessment Patient in pain? no      Comments Pt would like cheaper alternative for Crestor. Also states Zestril was discontinued. Mervin Kung CMA Duncan Dull)  October 07, 2010 9:39 AM    Primary Care Provider:  Lemont Fillers FNP  CC:  3 month follow up.Marland Kitchen  History of Present Illness: Judith Flynn is a 70 year old female who presents today for routine followup.  #1 hyperlipidemia-patient reports that Crestor is currently costing her over $100 a month. She finds this unaffordable at this time. She wishes to switch to a generic.  #2 hypertension-patient reports that she has not been eating well recently. She plans to make some positive dietary changes.  #3 tobacco abuse-patient reports that she is currently smoking 10-13 cigarettes a day. She tells me that she is motivated to quit within the next 3 months.   Preventive Screening-Counseling & Management  Alcohol-Tobacco     Smoking Status: current     Smoking Cessation Counseling: yes     Tobacco Counseling: to quit use of tobacco products  Allergies: 1)  Lisinopril (Lisinopril)  Past History:  Past Medical History: Last updated: 04/01/2010 history of colon polyps hearing loss osteoporosis tobacco abuse Breast cancer - left breast 2000 (s/p lumpectomy and radiation)  Past Surgical History: Last updated: 03/07/2010 breast biopsy in 2000 surgery on R ear drum  Lumpectomy left 2000  Review of Systems       patient denies edema  Physical Exam  General:   Well-developed,well-nourished,in no acute distress; alert,appropriate and cooperative throughout examination Lungs:  Normal respiratory effort, chest expands symmetrically. Lungs are clear to auscultation, no crackles or wheezes. Heart:  Normal rate and regular rhythm. S1 and S2 normal without gallop, murmur, click, rub or other extra sounds. Extremities:  No clubbing, cyanosis, edema, or deformity noted with normal full range of motion of all joints.     Impression & Recommendations:  Problem # 1:  HYPERLIPIDEMIA (ICD-272.4) Assessment Comment Only The patient tells me that she still has a three-month supply of Crestor left which she wishes to complete. After she completes this three-month supply, we will plan to switch her to atorvastatin. She will schedule her followup appointment for 4 months at which time we will plan to check a fasting cholesterol and LFTs. Her updated medication list for this problem includes:    Atorvastatin Calcium 20 Mg Tabs (Atorvastatin calcium) ..... One tablet by mouth at bedtime  Future Orders: T-Lipid Profile ([id number]) ... 01/30/2011  Problem # 2:  HYPERTENSION (ICD-401.9) Assessment: Unchanged pressure remained stable. The following medications were removed from the medication list:    Zestril 10 Mg Tabs (Lisinopril) ..... One tab by mouth daily  BP today: 136/76 Prior BP: 134/70 (06/27/2010)  Labs Reviewed: K+: 4.5 (01/11/2010) Creat: : 0.87 (01/11/2010)   Chol: 150 (01/11/2010)   HDL: 53 (01/11/2010)   LDL: 82 (01/11/2010)  TG: 74 (01/11/2010)  Problem # 3:  VITAMIN D DEFICIENCY (ICD-268.9) Assessment: Improved her vitamin D level is stable on current dosing of vitamin D continue same.  Complete Medication List: 1)  Omeprazole 20 Mg Cpdr (Omeprazole) .... 2 caps by mouth daily 2)  Calcium 1200 1200-1000 Mg-unit Chew (Calcium carbonate-vit d-min) .... Take 1 tablet by mouth once a day 3)  Atorvastatin Calcium 20 Mg Tabs (Atorvastatin  calcium) .... One tablet by mouth at bedtime 4)  Vitamin D 2000 Unit Tabs (Cholecalciferol) .... Take 1 tablet by mouth once a day 5)  Tylenol 325 Mg Tabs (Acetaminophen) .... 2 tabs by mouth every 6 hours as needed 6)  Reclast 5 Mg/147ml Soln (Zoledronic acid) .... Annual infusion  Other Orders: Future Orders: T-Hepatic Function [id number]) ... 01/30/2011  Patient Instructions: 1)  Please follow up in 4 months (1 month after starting your Atorvastatin- generic lipitor) 2)  Complete your  blood work 2-3 days before your appointment.   Prescriptions: OMEPRAZOLE 20 MG CPDR (OMEPRAZOLE) 2 caps by mouth daily  #180 x 0   Entered and Authorized by:   Lemont Fillers FNP   Signed by:   Lemont Fillers FNP on 10/07/2010   Method used:   Faxed to ...       [facility] (mail-order)       [address]       [place], [place]  [id number]       Ph: [phone]       Fax: [phone]   RxID:   [id number] ATORVASTATIN CALCIUM 20 MG TABS (ATORVASTATIN CALCIUM) one tablet by mouth at bedtime  #90 x 0   Entered and Authorized by:   Lemont Fillers FNP   Signed by:   Lemont Fillers FNP on 10/07/2010   Method used:   Faxed to ...       [facility] (mail-order)       [address]       [place], [place]  [id number]       Ph: [phone]       Fax: [phone]   RxID:   [id number]    Orders Added: 1)  T-Hepatic Function [phone] 2)  T-Lipid Profile [phone] 3)  Est. Patient Level III [[address]]    Current Allergies (reviewed today): LISINOPRIL (LISINOPRIL)

## 2010-10-11 NOTE — Progress Notes (Signed)
----   Converted from flag ---- ---- 10/07/2010 10:15 AM, Mervin Kung CMA (AAMA) wrote: ordered entered and faxed to the lab.  ---- 10/07/2010 10:02 AM, Lemont Fillers FNP wrote: could you please send FLP (272.4) to lab for 4 months. ------------------------------

## 2010-10-11 NOTE — Progress Notes (Signed)
  Phone Note Outgoing Call    New Problems: VITAMIN D DEFICIENCY (ICD-268.9)   New Problems: VITAMIN D DEFICIENCY (ICD-268.9)

## 2010-10-11 NOTE — Progress Notes (Signed)
----   Converted from flag ---- ---- 10/07/2010 10:15 AM, Mervin Kung CMA (AAMA) wrote: order entered and faxed to the lab  ---- 10/07/2010 10:04 AM, Lemont Fillers FNP wrote: also send LFTs (therapeutic drug monitoring) ------------------------------

## 2011-02-09 ENCOUNTER — Encounter: Payer: Self-pay | Admitting: Family

## 2011-02-21 ENCOUNTER — Ambulatory Visit (INDEPENDENT_AMBULATORY_CARE_PROVIDER_SITE_OTHER): Payer: Medicare Other | Admitting: Family

## 2011-02-21 ENCOUNTER — Ambulatory Visit (HOSPITAL_BASED_OUTPATIENT_CLINIC_OR_DEPARTMENT_OTHER)
Admission: RE | Admit: 2011-02-21 | Discharge: 2011-02-21 | Disposition: A | Discharge status: Home / Self Care | Payer: Medicare Other | Source: Ambulatory Visit | Attending: Family | Admitting: Family

## 2011-02-21 ENCOUNTER — Encounter: Payer: Self-pay | Admitting: Family

## 2011-02-21 ENCOUNTER — Other Ambulatory Visit: Payer: Self-pay | Admitting: Family

## 2011-02-21 VITALS — BP 122/70 | HR 78 | Temp 97.8°F | Resp 16 | Ht 60.51 in | Wt 147.1 lb

## 2011-02-21 DIAGNOSIS — Z1239 Encounter for other screening for malignant neoplasm of breast: Secondary | ICD-10-CM

## 2011-02-21 DIAGNOSIS — T148XXA Other injury of unspecified body region, initial encounter: Secondary | ICD-10-CM

## 2011-02-21 DIAGNOSIS — I10 Essential (primary) hypertension: Secondary | ICD-10-CM

## 2011-02-21 DIAGNOSIS — Z1231 Encounter for screening mammogram for malignant neoplasm of breast: Secondary | ICD-10-CM | POA: Insufficient documentation

## 2011-02-21 DIAGNOSIS — E785 Hyperlipidemia, unspecified: Secondary | ICD-10-CM

## 2011-02-21 MED ORDER — ATORVASTATIN CALCIUM 20 MG PO TABS: 20.0000 mg | ORAL_TABLET | Freq: Every day | ORAL | Status: DC

## 2011-02-21 MED ORDER — OMEPRAZOLE 20 MG PO CPDR: 40.0000 mg | DELAYED_RELEASE_CAPSULE | Freq: Every day | ORAL | Status: DC

## 2011-02-21 NOTE — Progress Notes (Signed)
Subjective:    Patient ID: Judith Flynn, female    DOB: September 09, 1940, 70 y.o.   MRN: [id number]  HPI  1. Patient presents today for followup of hypertension. Patient has been treated for Chronic HTN for quite sometime. She is currently not on any medication and is well controlled. No associated S/S related to HTN.   Quality: chronic Modifying factor: meds Duration: Quite sometime Associated S/S: None.  The patient denies the following associated symptoms: Chest pain, dyspnea, blurred vision, headache, or lower extremity edema.  2.   Hyperlipidemia- pt continues atorvastatin- denies myalgias.  3.  Back injury-  Taking ibuprofen with some help.  Has some pain around the left shoulder blade, trying to pull the cat out from under the dresser.      Review of Systems  See HPI  Past Medical History  Diagnosis Date  . Colon polyp   . Hearing loss   . Osteoporosis   . Tobacco abuse   . Cancer 2000    breast- left breast- s/p lumpectomy and radiation    History   Social History  . Marital Status: Widowed    Spouse Name: N/A    Number of Children: 3  . Years of Education: N/A   Occupational History  . retired    Social History Main Topics  . Smoking status: Current Everyday Smoker -- 0.5 packs/day    Types: Cigarettes  . Smokeless tobacco: Not on file  . Alcohol Use: Not on file  . Drug Use: Not on file  . Sexually Active: Not on file   Other Topics Concern  . Not on file   Social History Narrative   Widow/ widowerRegular exercise- yesOriginally from [place]    Past Surgical History  Procedure Date  . Breast biopsy 2000  . Surgery on ear drum     right  . Breast lumpectomy 2000    left    Family History  Problem Relation Age of Onset  . Alcohol abuse Mother   . Arthritis Mother   . Hypertension Mother 22  . Heart disease Mother     CHF  . Alcohol abuse Father   . Arthritis Father   . Hypertension Father 41  . Heart attack Father      Allergies  Allergen Reactions  . Lisinopril     REACTION: cough    Current Outpatient Prescriptions on File Prior to Visit  Medication Sig Dispense Refill  . acetaminophen (TYLENOL) 325 MG tablet Take 650 mg by mouth every 6 (six) hours as needed.        Marland Kitchen atorvastatin (LIPITOR) 20 MG tablet Take 20 mg by mouth daily.        . Calcium Carbonate-Vit D-Min (CALCIUM 1200) 1200-1000 MG-UNIT CHEW Chew 1 tablet by mouth daily.        . Ergocalciferol (VITAMIN D2) 2000 UNITS TABS Take 1 tablet by mouth daily.        Marland Kitchen omeprazole (PRILOSEC) 20 MG capsule Take 40 mg by mouth daily.        . zoledronic acid (RECLAST) 5 MG/100ML SOLN Inject 5 mg into the vein once. Annual infusion         BP 122/70  Pulse 78  Temp(Src) 97.8 F (36.6 C) (Oral)  Resp 16  Ht 5' 0.51" (1.537 m)  Wt 147 lb 1.9 oz (66.733 kg)  BMI 28.25 kg/m2        Objective:   Physical Exam  Constitutional: She appears well-developed and well-nourished.  Cardiovascular: Normal rate and regular rhythm.   Pulmonary/Chest: Effort normal and breath sounds normal.  Musculoskeletal:       + tenderness to palpation overlying left scapula.    Ext: No edema is noted         Assessment & Plan:

## 2011-02-21 NOTE — Patient Instructions (Addendum)
Please follow up in 6 months, sooner if problems or concerns.  Schedule your mammogram on the first floor.

## 2011-02-21 NOTE — Assessment & Plan Note (Signed)
I suspect that  pain surrounding the scapula is due to muscle strain. I recommended stretching and short-term use of NSAIDs. Patient verbalizes understanding

## 2011-02-21 NOTE — Assessment & Plan Note (Signed)
She has transitioned to a Darvocet and without any difficulty. We'll check followup liver function tests and fasting lipid panel today.

## 2011-02-21 NOTE — Assessment & Plan Note (Signed)
She remains off of her ACE inhibitor. Blood pressure looks great. Continue low sodium diet.

## 2011-02-22 LAB — HEPATIC FUNCTION PANEL
AST: 11 U/L (ref 0–37)
Albumin: 4.5 g/dL (ref 3.5–5.2)
Alkaline Phosphatase: 52 U/L (ref 39–117)
Total Protein: 6.9 g/dL (ref 6.0–8.3)

## 2011-02-22 LAB — LIPID PANEL
HDL: 61 mg/dL (ref >39)
LDL Cholesterol: 110 mg/dL — ABNORMAL HIGH (ref 0–99)
Triglycerides: 111 mg/dL (ref <150)

## 2011-02-28 ENCOUNTER — Encounter: Payer: Self-pay | Admitting: Family

## 2011-04-19 ENCOUNTER — Ambulatory Visit (INDEPENDENT_AMBULATORY_CARE_PROVIDER_SITE_OTHER): Payer: Medicare Other | Admitting: Family

## 2011-04-19 DIAGNOSIS — Z23 Encounter for immunization: Secondary | ICD-10-CM

## 2011-05-03 ENCOUNTER — Other Ambulatory Visit: Payer: Self-pay | Admitting: Hematology & Oncology

## 2011-05-03 ENCOUNTER — Encounter (HOSPITAL_BASED_OUTPATIENT_CLINIC_OR_DEPARTMENT_OTHER): Payer: Medicare Other | Admitting: Hematology & Oncology

## 2011-05-03 DIAGNOSIS — M810 Age-related osteoporosis without current pathological fracture: Secondary | ICD-10-CM

## 2011-05-03 DIAGNOSIS — M818 Other osteoporosis without current pathological fracture: Secondary | ICD-10-CM

## 2011-05-03 DIAGNOSIS — Z853 Personal history of malignant neoplasm of breast: Secondary | ICD-10-CM

## 2011-05-03 DIAGNOSIS — E559 Vitamin D deficiency, unspecified: Secondary | ICD-10-CM

## 2011-05-03 DIAGNOSIS — C50419 Malignant neoplasm of upper-outer quadrant of unspecified female breast: Secondary | ICD-10-CM

## 2011-05-03 LAB — VITAMIN D 25 HYDROXY (VIT D DEFICIENCY, FRACTURES): Vit D, 25-Hydroxy: 50 ng/mL (ref 30–89)

## 2011-05-03 LAB — CMP (CANCER CENTER ONLY)
ALT(SGPT): 7 U/L — ABNORMAL LOW (ref 10–47)
AST: 16 U/L (ref 11–38)
Albumin: 3.8 g/dL (ref 3.3–5.5)
Calcium: 8.8 mg/dL (ref 8.0–10.3)
Chloride: 104 mEq/L (ref 98–108)
Potassium: 4.3 mEq/L (ref 3.3–4.7)

## 2011-05-03 LAB — CBC WITH DIFFERENTIAL (CANCER CENTER ONLY)
BASO%: 0.7 % (ref 0.0–2.0)
EOS%: 1 % (ref 0.0–7.0)
LYMPH#: 1.7 10*3/uL (ref 0.9–3.3)
MCHC: 35.6 g/dL (ref 32.0–36.0)
NEUT#: 3.6 10*3/uL (ref 1.5–6.5)
RDW: 12.1 % (ref 11.1–15.7)

## 2011-05-09 ENCOUNTER — Ambulatory Visit
Admission: RE | Admit: 2011-05-09 | Discharge: 2011-05-09 | Disposition: A | Discharge status: Home / Self Care | Payer: Medicare Other | Source: Ambulatory Visit | Attending: Hematology & Oncology | Admitting: Hematology & Oncology

## 2011-05-09 DIAGNOSIS — Z853 Personal history of malignant neoplasm of breast: Secondary | ICD-10-CM

## 2011-08-21 ENCOUNTER — Ambulatory Visit (INDEPENDENT_AMBULATORY_CARE_PROVIDER_SITE_OTHER): Payer: Medicare Other | Admitting: Family

## 2011-08-21 DIAGNOSIS — M810 Age-related osteoporosis without current pathological fracture: Secondary | ICD-10-CM

## 2011-08-21 DIAGNOSIS — I10 Essential (primary) hypertension: Secondary | ICD-10-CM

## 2011-08-21 DIAGNOSIS — E785 Hyperlipidemia, unspecified: Secondary | ICD-10-CM | POA: Diagnosis not present

## 2011-08-21 MED ORDER — OMEPRAZOLE 20 MG PO CPDR: 40.0000 mg | DELAYED_RELEASE_CAPSULE | Freq: Every day | ORAL | Status: DC

## 2011-08-21 MED ORDER — ATORVASTATIN CALCIUM 20 MG PO TABS: 20.0000 mg | ORAL_TABLET | Freq: Every day | ORAL | Status: DC

## 2011-08-21 NOTE — Assessment & Plan Note (Signed)
Reasonable control with diet alone, continue low sodium diet, monitor.

## 2011-08-21 NOTE — Progress Notes (Signed)
  Subjective:    Patient ID: Judith Flynn, female    DOB: 27-Oct-1940, 71 y.o.   MRN: [id number]  HPI  Ms.  Quiett is a 71 yr old female who presents today for follow up.  HTN-  Not current on medication for her blood pressure. She is watching her  Sodium.    Hyperlipidemia- She denies myalgias. Watching her diet.    Osteoporosis- had reclast in October.       Review of Systems See HPI  Past Medical History  Diagnosis Date  . Colon polyp   . Hearing loss   . Osteoporosis   . Tobacco abuse   . Cancer 2000    breast- left breast- s/p lumpectomy and radiation    History   Social History  . Marital Status: Widowed    Spouse Name: N/A    Number of Children: 3  . Years of Education: N/A   Occupational History  . retired    Social History Main Topics  . Smoking status: Current Everyday Smoker -- 0.5 packs/day    Types: Cigarettes  . Smokeless tobacco: Not on file  . Alcohol Use: Not on file  . Drug Use: Not on file  . Sexually Active: Not on file   Other Topics Concern  . Not on file   Social History Narrative   Widow/ widowerRegular exercise- yesOriginally from [place]    Past Surgical History  Procedure Date  . Breast biopsy 2000  . Surgery on ear drum     right  . Breast lumpectomy 2000    left    Family History  Problem Relation Age of Onset  . Alcohol abuse Mother   . Arthritis Mother   . Hypertension Mother 29  . Heart disease Mother     CHF  . Alcohol abuse Father   . Arthritis Father   . Hypertension Father 49  . Heart attack Father     Allergies  Allergen Reactions  . Lisinopril     REACTION: cough    Current Outpatient Prescriptions on File Prior to Visit  Medication Sig Dispense Refill  . acetaminophen (TYLENOL) 325 MG tablet Take 650 mg by mouth every 6 (six) hours as needed.        . Calcium Carbonate-Vit D-Min (CALCIUM 1200) 1200-1000 MG-UNIT CHEW Chew 1 tablet by mouth daily.        . Ergocalciferol (VITAMIN D2) 2000  UNITS TABS Take 1 tablet by mouth daily.        . zoledronic acid (RECLAST) 5 MG/100ML SOLN Inject 5 mg into the vein once. Annual infusion         BP 142/84  Pulse 78  Temp 97.9 F (36.6 C)  Ht 5' (1.524 m)  Wt 149 lb (67.586 kg)  BMI 29.10 kg/m2  SpO2 98%       Objective:   Physical Exam  Constitutional: She appears well-developed and well-nourished.  Cardiovascular: Normal rate and regular rhythm.   No murmur heard. Pulmonary/Chest: Effort normal and breath sounds normal. No respiratory distress. She has no wheezes. She has no rales.  Musculoskeletal: She exhibits no edema.  Psychiatric: She has a normal mood and affect. Her behavior is normal. Judgment and thought content normal.          Assessment & Plan:

## 2011-08-21 NOTE — Assessment & Plan Note (Signed)
Last cholesterol 6 months ago was stable.  Continue current dose of lipitor.

## 2011-08-21 NOTE — Assessment & Plan Note (Signed)
She will be due for follow up reclast next fall. Continue Calcium and vitamin D.

## 2011-08-21 NOTE — Patient Instructions (Signed)
Please follow up in 6 months.

## 2012-01-29 ENCOUNTER — Telehealth: Payer: Self-pay | Admitting: *Deleted

## 2012-01-29 ENCOUNTER — Ambulatory Visit (INDEPENDENT_AMBULATORY_CARE_PROVIDER_SITE_OTHER): Payer: Medicare Other | Admitting: Family

## 2012-01-29 ENCOUNTER — Encounter: Payer: Self-pay | Admitting: Family

## 2012-01-29 VITALS — BP 126/84 | HR 81 | Temp 98.0°F | Resp 16 | Ht 60.0 in | Wt 145.1 lb

## 2012-01-29 DIAGNOSIS — E785 Hyperlipidemia, unspecified: Secondary | ICD-10-CM

## 2012-01-29 DIAGNOSIS — M810 Age-related osteoporosis without current pathological fracture: Secondary | ICD-10-CM

## 2012-01-29 DIAGNOSIS — I10 Essential (primary) hypertension: Secondary | ICD-10-CM | POA: Diagnosis not present

## 2012-01-29 DIAGNOSIS — M129 Arthropathy, unspecified: Secondary | ICD-10-CM

## 2012-01-29 NOTE — Assessment & Plan Note (Addendum)
Continues calcium.  Due for Reclast in October.

## 2012-01-29 NOTE — Assessment & Plan Note (Signed)
Unchanged. Continue thumb brace prn.  Monitor.

## 2012-01-29 NOTE — Assessment & Plan Note (Signed)
BP stable on diet only.  She has started walking and I commended her for this.  Monitor.

## 2012-01-29 NOTE — Telephone Encounter (Signed)
Message copied by Kathi Simpers on Mon Jan 29, 2012  4:03 PM ------      Message from: O'SULLIVAN, MELISSA      Created: Mon Jan 29, 2012  9:42 AM       Pls send orders to the lab for- FLP/LFT (hyperlipidemia). She will return later this week.  thanks

## 2012-01-29 NOTE — Telephone Encounter (Signed)
Future lab orders placed and given to the lab.

## 2012-01-29 NOTE — Assessment & Plan Note (Signed)
She is not fasting today, but is due for follow up FLP.  She will return to the lab this week for flp and lft.

## 2012-01-29 NOTE — Progress Notes (Signed)
Subjective:    Patient ID: Judith Flynn, female    DOB: May 25, 1941, 71 y.o.   MRN: [id number]  HPI  Judith Flynn is a 71 yr old female who presents today for follow up.   HTN-  Watching sodium.  Has started walking.    Osteoarthritis- Reports that she sometimes has problems in the left thumb.  Wore her brace last week which helped.   Hyperlipidemia-tries to watch her cholesterol.     Review of Systems See HPI  Past Medical History  Diagnosis Date  . Colon polyp   . Hearing loss   . Osteoporosis   . Tobacco abuse   . Cancer 2000    breast- left breast- s/p lumpectomy and radiation    History   Social History  . Marital Status: Widowed    Spouse Name: N/A    Number of Children: 3  . Years of Education: N/A   Occupational History  . retired    Social History Main Topics  . Smoking status: Current Everyday Smoker -- 0.5 packs/day    Types: Cigarettes  . Smokeless tobacco: Not on file  . Alcohol Use: Not on file  . Drug Use: Not on file  . Sexually Active: Not on file   Other Topics Concern  . Not on file   Social History Narrative   Widow/ widowerRegular exercise- yesOriginally from [place]    Past Surgical History  Procedure Date  . Breast biopsy 2000  . Surgery on ear drum     right  . Breast lumpectomy 2000    left    Family History  Problem Relation Age of Onset  . Alcohol abuse Mother   . Arthritis Mother   . Hypertension Mother 64  . Heart disease Mother     CHF  . Alcohol abuse Father   . Arthritis Father   . Hypertension Father 8  . Heart attack Father     Allergies  Allergen Reactions  . Lisinopril     REACTION: cough    Current Outpatient Prescriptions on File Prior to Visit  Medication Sig Dispense Refill  . acetaminophen (TYLENOL) 325 MG tablet Take 650 mg by mouth every 6 (six) hours as needed.        Marland Kitchen atorvastatin (LIPITOR) 20 MG tablet Take 1 tablet (20 mg total) by mouth daily.  90 tablet  1  . Calcium  Carbonate-Vit D-Min (CALCIUM 1200) 1200-1000 MG-UNIT CHEW Chew 1 tablet by mouth daily.        . Ergocalciferol (VITAMIN D2) 2000 UNITS TABS Take 1 tablet by mouth daily.        Marland Kitchen omeprazole (PRILOSEC) 20 MG capsule Take 2 capsules (40 mg total) by mouth daily.  180 capsule  1  . zoledronic acid (RECLAST) 5 MG/100ML SOLN Inject 5 mg into the vein once. Annual infusion         BP 126/84  Pulse 81  Temp 98 F (36.7 C) (Oral)  Resp 16  Ht 5' (1.524 m)  Wt 145 lb 1.9 oz (65.826 kg)  BMI 28.34 kg/m2  SpO2 97%       Objective:   Physical Exam  Constitutional: She is oriented to person, place, and time. She appears well-developed and well-nourished. No distress.  Cardiovascular: Normal rate and regular rhythm.   No murmur heard. Pulmonary/Chest: Effort normal and breath sounds normal. No respiratory distress. She has no wheezes. She has no rales. She exhibits no tenderness.  Musculoskeletal: She exhibits no  edema.  Neurological: She is alert and oriented to person, place, and time.  Psychiatric: She has a normal mood and affect. Her behavior is normal. Judgment and thought content normal.          Assessment & Plan:

## 2012-01-29 NOTE — Patient Instructions (Addendum)
Please return fasting for your cholesterol. Follow up in 6 months, sooner if problems/concerns.

## 2012-01-30 DIAGNOSIS — E785 Hyperlipidemia, unspecified: Secondary | ICD-10-CM | POA: Diagnosis not present

## 2012-01-30 LAB — HEPATIC FUNCTION PANEL
ALT: 8 U/L (ref 0–35)
AST: 11 U/L (ref 0–37)
Albumin: 4.2 g/dL (ref 3.5–5.2)
Alkaline Phosphatase: 48 U/L (ref 39–117)
Bilirubin, Direct: 0.1 mg/dL (ref 0.0–0.3)
Indirect Bilirubin: 0.4 mg/dL (ref 0.0–0.9)
Total Bilirubin: 0.5 mg/dL (ref 0.3–1.2)
Total Protein: 6.4 g/dL (ref 6.0–8.3)

## 2012-01-30 NOTE — Addendum Note (Signed)
Addended by: Mervin Kung A on: 01/30/2012 08:20 AM   Modules accepted: Orders

## 2012-01-30 NOTE — Telephone Encounter (Signed)
Pt presented to the lab, future order released. 

## 2012-01-31 ENCOUNTER — Encounter: Payer: Self-pay | Admitting: Family

## 2012-01-31 LAB — LIPID PANEL
Cholesterol: 222 mg/dL — ABNORMAL HIGH (ref 0–200)
Triglycerides: 72 mg/dL (ref <150)

## 2012-02-01 ENCOUNTER — Other Ambulatory Visit: Payer: Self-pay | Admitting: Family

## 2012-02-01 DIAGNOSIS — E785 Hyperlipidemia, unspecified: Secondary | ICD-10-CM

## 2012-02-01 NOTE — Telephone Encounter (Signed)
Pls call pt and let her know that cholesterol is elevated.  I would like her to increase her lipitor from 20 to 40mg .  OK to send 90 days to pharmacy of her choice please.  Needs FLP/LFT in 3 months.

## 2012-02-02 MED ORDER — ATORVASTATIN CALCIUM 40 MG PO TABS: 40.0000 mg | ORAL_TABLET | Freq: Every day | ORAL | Status: DC

## 2012-02-02 NOTE — Telephone Encounter (Signed)
Patient notified of blood test result and need for increase in lipitor. And to follow up for lab work in 3 months.

## 2012-02-09 NOTE — Addendum Note (Signed)
Addended by: Mervin Kung A on: 02/09/2012 09:33 AM   Modules accepted: Orders

## 2012-02-09 NOTE — Telephone Encounter (Signed)
Future orders placed and copy given to the lab. 

## 2012-02-14 ENCOUNTER — Other Ambulatory Visit: Payer: Self-pay | Admitting: Family

## 2012-02-14 DIAGNOSIS — Z1231 Encounter for screening mammogram for malignant neoplasm of breast: Secondary | ICD-10-CM

## 2012-03-05 ENCOUNTER — Ambulatory Visit (HOSPITAL_BASED_OUTPATIENT_CLINIC_OR_DEPARTMENT_OTHER)
Admission: RE | Admit: 2012-03-05 | Discharge: 2012-03-05 | Disposition: A | Discharge status: Home / Self Care | Payer: Medicare Other | Source: Ambulatory Visit | Attending: Family | Admitting: Family

## 2012-03-05 DIAGNOSIS — Z1231 Encounter for screening mammogram for malignant neoplasm of breast: Secondary | ICD-10-CM | POA: Diagnosis not present

## 2012-03-05 IMAGING — MG MM DIGITAL SCREENING
5 series · 5 of 5 positions shown · non-contrast
Comparison: Previous exams.

CLINICAL DATA: Screening.

DIGITAL BILATERAL SCREENING MAMMOGRAM WITH CAD

[R CC]
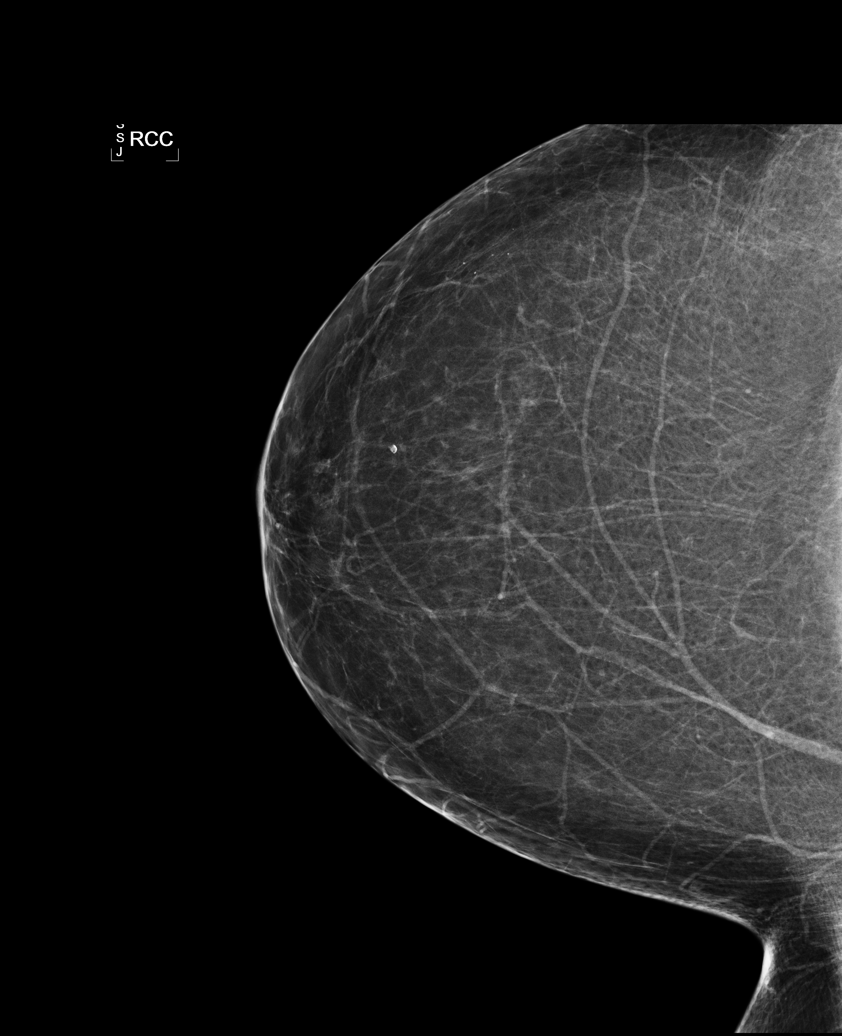

[L CC]
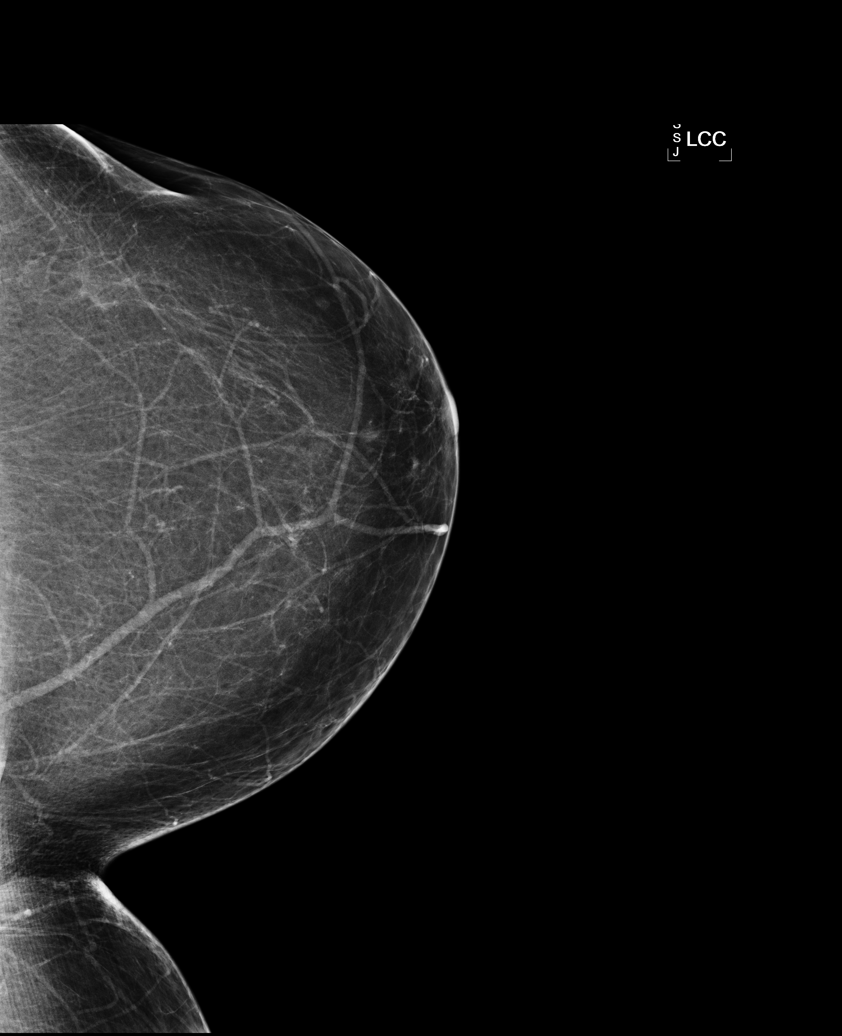

[L MLO]
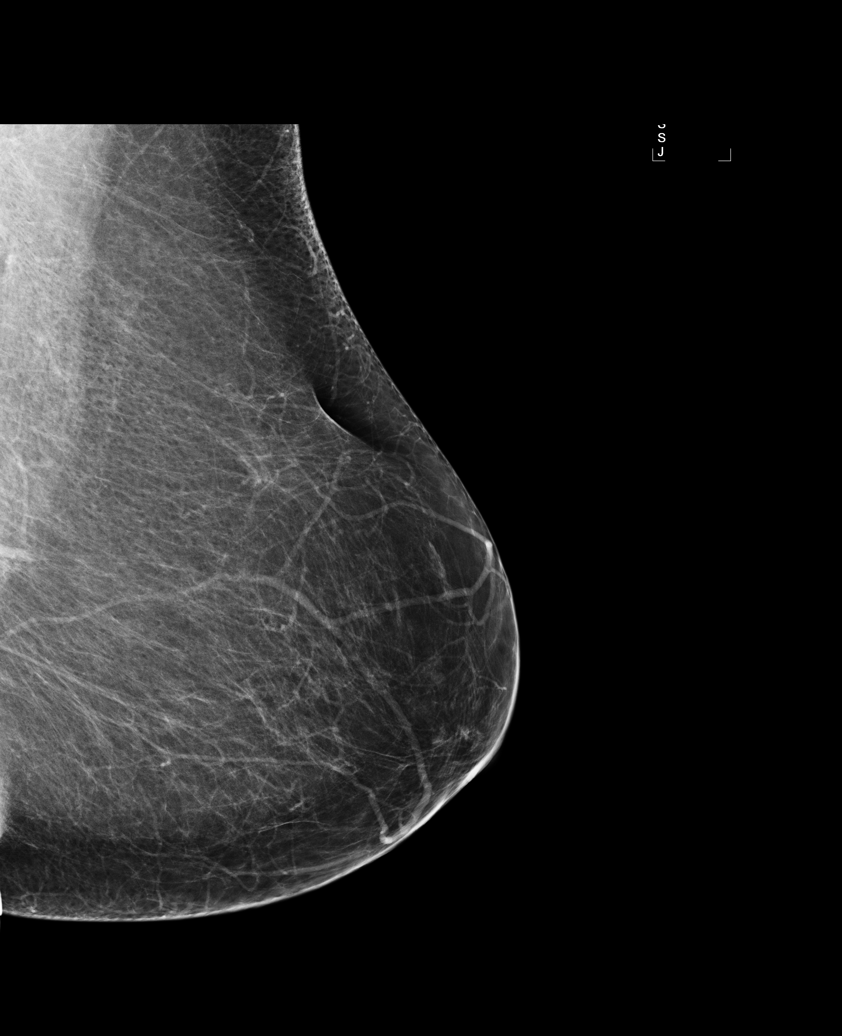

[R MLO (1 of 2)]
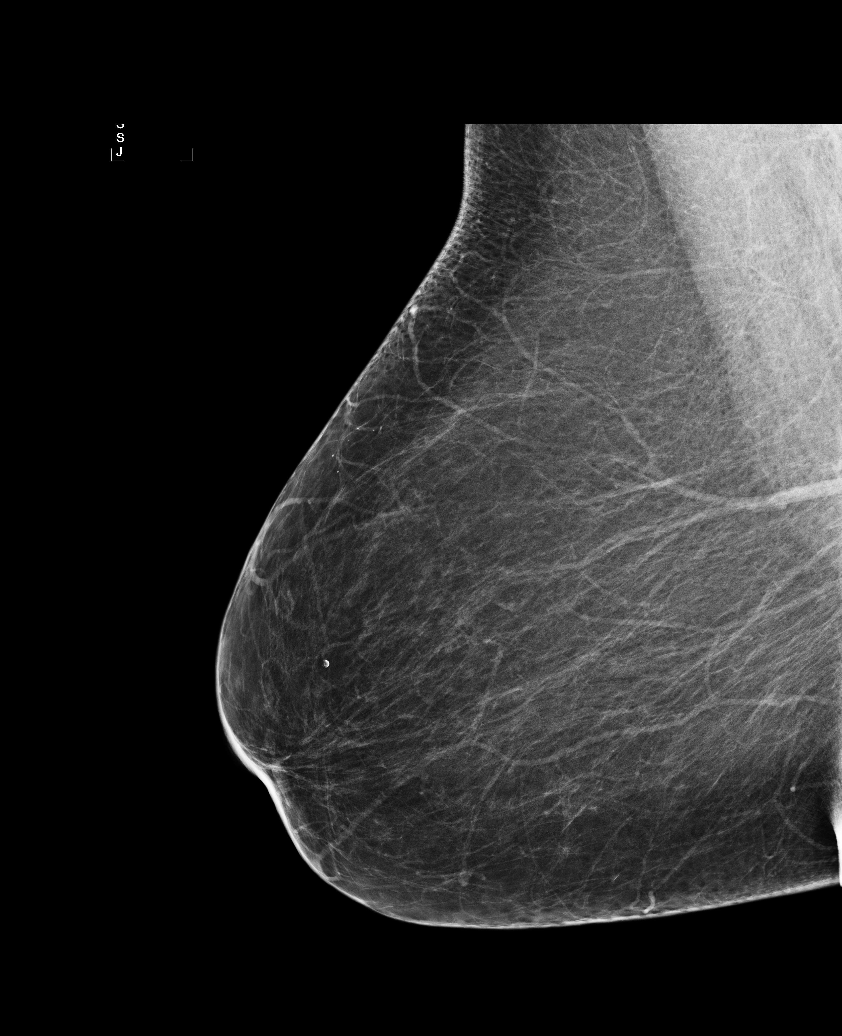

[R MLO (2 of 2)]
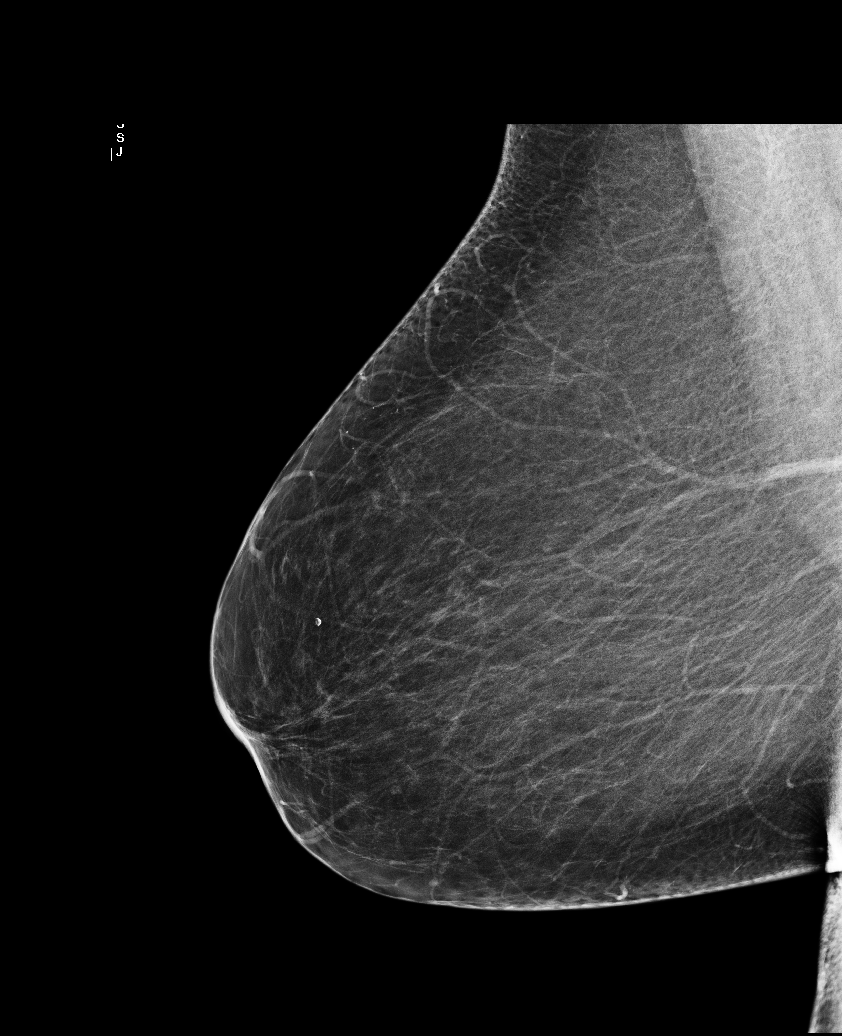

[5 of 5 positions shown; findings below may reference images not displayed]

FINDINGS: The breast tissue is almost entirely fatty.
Scarring in the upper outer left breast is again identified.
No suspicious masses, nonsurgical/nontraumatic architectural
distortion, or calcifications are present.

Images were processed with CAD.
IMPRESSION: No mammographic evidence of malignancy.

Left breast scarring.

A result letter of this screening mammogram will be mailed directly
to the patient.

RECOMMENDATION:
Screening mammogram in one year. (Code:83-K-POQ)

BI-RADS CATEGORY 2:  Benign finding(s).

## 2012-04-17 ENCOUNTER — Other Ambulatory Visit: Payer: Self-pay | Admitting: Family

## 2012-04-17 NOTE — Telephone Encounter (Signed)
Spoke with pt. She still has about 2-21/2 weeks worth of medication but reports she will be going out of town on 05/07/12 and wanted to pick it up before she leaves. Refill sent #180 x 1 refill.

## 2012-04-18 ENCOUNTER — Telehealth: Payer: Self-pay | Admitting: Hematology & Oncology

## 2012-04-18 NOTE — Telephone Encounter (Signed)
Left message with 05-20-12 appointment

## 2012-05-01 ENCOUNTER — Ambulatory Visit (INDEPENDENT_AMBULATORY_CARE_PROVIDER_SITE_OTHER): Payer: Medicare Other

## 2012-05-01 DIAGNOSIS — Z23 Encounter for immunization: Secondary | ICD-10-CM | POA: Diagnosis not present

## 2012-05-02 ENCOUNTER — Other Ambulatory Visit: Payer: Self-pay | Admitting: Family

## 2012-05-20 ENCOUNTER — Ambulatory Visit: Payer: Medicare Other | Admitting: Hematology & Oncology

## 2012-05-20 ENCOUNTER — Other Ambulatory Visit: Payer: Medicare Other | Admitting: Lab

## 2012-05-23 ENCOUNTER — Ambulatory Visit (HOSPITAL_BASED_OUTPATIENT_CLINIC_OR_DEPARTMENT_OTHER): Payer: Medicare Other | Admitting: Hematology & Oncology

## 2012-05-23 ENCOUNTER — Other Ambulatory Visit (HOSPITAL_BASED_OUTPATIENT_CLINIC_OR_DEPARTMENT_OTHER): Payer: Medicare Other | Admitting: Lab

## 2012-05-23 VITALS — BP 135/69 | HR 77 | Temp 98.0°F | Resp 16 | Ht 60.0 in | Wt 145.0 lb

## 2012-05-23 DIAGNOSIS — Z87898 Personal history of other specified conditions: Secondary | ICD-10-CM | POA: Diagnosis not present

## 2012-05-23 DIAGNOSIS — M810 Age-related osteoporosis without current pathological fracture: Secondary | ICD-10-CM

## 2012-05-23 DIAGNOSIS — E559 Vitamin D deficiency, unspecified: Secondary | ICD-10-CM | POA: Diagnosis not present

## 2012-05-23 DIAGNOSIS — C50419 Malignant neoplasm of upper-outer quadrant of unspecified female breast: Secondary | ICD-10-CM | POA: Diagnosis not present

## 2012-05-23 DIAGNOSIS — C50919 Malignant neoplasm of unspecified site of unspecified female breast: Secondary | ICD-10-CM

## 2012-05-23 LAB — CBC WITH DIFFERENTIAL (CANCER CENTER ONLY)
BASO#: 0 10*3/uL (ref 0.0–0.2)
Eosinophils Absolute: 0 10*3/uL (ref 0.0–0.5)
HCT: 38.6 % (ref 34.8–46.6)
HGB: 13.4 g/dL (ref 11.6–15.9)
LYMPH#: 1.6 10*3/uL (ref 0.9–3.3)
MONO#: 0.4 10*3/uL (ref 0.1–0.9)
NEUT%: 66.3 % (ref 39.6–80.0)
RBC: 4.17 10*6/uL (ref 3.70–5.32)
WBC: 5.9 10*3/uL (ref 3.9–10.0)

## 2012-05-23 LAB — CMP (CANCER CENTER ONLY)
ALT(SGPT): 17 U/L (ref 10–47)
Albumin: 3.9 g/dL (ref 3.3–5.5)
BUN, Bld: 15 mg/dL (ref 7–22)
CO2: 28 mEq/L (ref 18–33)
Calcium: 8.9 mg/dL (ref 8.0–10.3)
Chloride: 104 mEq/L (ref 98–108)
Creat: 0.8 mg/dl (ref 0.6–1.2)

## 2012-05-23 NOTE — Progress Notes (Signed)
This office note has been dictated.

## 2012-05-24 LAB — VITAMIN D 25 HYDROXY (VIT D DEFICIENCY, FRACTURES): Vit D, 25-Hydroxy: 42 ng/mL (ref 30–89)

## 2012-05-24 NOTE — Progress Notes (Signed)
DIAGNOSIS:  History of DCIS (ductal carcinoma in situ) of the left breast.  CURRENT THERAPY:  Observation.  INTERIM HISTORY:  Ms. Flaugher comes in for followup.  We see her yearly. She is doing okay.  She is still smoking.  She is trying to cut back. The patient has had no problems since we last saw her.  She has had no cough or shortness of breath.  She has had no change in bowel or bladder habits.  There has been no rash.  There has been no leg swelling.  She does get Reclast yearly.  We will set her up for this in a week or so.  She has had no problems with abdominal pain.  She has had no headache.  PHYSICAL EXAMINATION:  General:  This is a well-developed, well- nourished white female in no obvious distress.  Vital signs:  98, pulse 77, respiratory rate 16, blood pressure 135/69.  Weight is 145.  Head and neck:  Shows a normocephalic, atraumatic skull.  There are no ocular or oral lesions.  There are no palpable cervical or supraclavicular lymph nodes.  Lungs:  Clear bilaterally.  Cardiac:  Regular rate and rhythm with a normal S1, S2.  There are no murmurs, rubs or bruits. Breasts:  Shows right breast with no masses, edema or erythema.  There is no right axillary adenopathy.  Left breast shows well-healed lumpectomy at the 2 o'clock position.  There is some slight contraction at the lumpectomy site.  She has noticed no distinct mass in the left breast.  There is no left axillary adenopathy.  Abdomen:  Soft with good bowel sounds.  There is no fluid wave.  There is no palpable hepatosplenomegaly.  Back:  Shows some slight kyphosis.  Extremities: Shows no clubbing, cyanosis or edema.  LABORATORY STUDIES:  White cell count is 5.9, hemoglobin 13.4, hematocrit 38.6, platelet count 186.  Mammogram done in August of 2013 showed no suspicious calcifications.  IMPRESSION:  Ms. Mordan is a 71 year old white female with DCIS (ductal carcinoma in situ) of the left breast.  She  completed tamoxifen back in 2006.  She then underwent radiation therapy and tamoxifen after lumpectomy.  We will set her up with the Reclast in 1 or 2 weeks.  We will then plan to see her back in 1 year for followup.    ______________________________ Josph Macho, M.D. PRE/MEDQ  D:  05/23/2012  T:  05/24/2012  Job:  [id number]

## 2012-05-28 ENCOUNTER — Telehealth: Payer: Self-pay | Admitting: Hematology & Oncology

## 2012-05-28 ENCOUNTER — Telehealth: Payer: Self-pay | Admitting: Family

## 2012-05-28 DIAGNOSIS — E785 Hyperlipidemia, unspecified: Secondary | ICD-10-CM

## 2012-05-28 NOTE — Telephone Encounter (Signed)
New prescription request  Atorvastatin  Omeprazole

## 2012-05-28 NOTE — Telephone Encounter (Addendum)
Message copied by Cathi Roan on Tue May 28, 2012  2:51 PM ------      Message from: Ajo, Virginia N      Created: Tue May 28, 2012  1:39 PM                   ----- Message -----         From: Josph Macho, MD         Sent: 05/24/2012   9:24 PM           To: Nanci Pina Nurse Hp            Call - labs are normal!!!  Pete  05-28-12  2:51pm. Called patient regarding lab work, left message for a return call to office. Lupita Raider LPN

## 2012-05-29 ENCOUNTER — Ambulatory Visit (HOSPITAL_BASED_OUTPATIENT_CLINIC_OR_DEPARTMENT_OTHER): Payer: Medicare Other

## 2012-05-29 VITALS — BP 148/57 | HR 79 | Temp 97.9°F | Resp 20

## 2012-05-29 DIAGNOSIS — M810 Age-related osteoporosis without current pathological fracture: Secondary | ICD-10-CM | POA: Diagnosis not present

## 2012-05-29 MED ORDER — ATORVASTATIN CALCIUM 40 MG PO TABS: 40.0000 mg | ORAL_TABLET | Freq: Every day | ORAL | Status: DC

## 2012-05-29 MED ORDER — ZOLEDRONIC ACID 4 MG/5ML IV CONC
4.0000 mg | Freq: Once | INTRAVENOUS | Status: AC
  Administered 2012-05-29: 4 mg via INTRAVENOUS
  Filled 2012-05-29: qty 5

## 2012-05-29 MED ORDER — OMEPRAZOLE 20 MG PO CPDR: 20.0000 mg | DELAYED_RELEASE_CAPSULE | Freq: Two times a day (BID) | ORAL | Status: DC

## 2012-05-29 MED ORDER — SODIUM CHLORIDE 0.9 % IV SOLN
Freq: Once | INTRAVENOUS | Status: AC
  Administered 2012-05-29: 09:00:00 via INTRAVENOUS

## 2012-05-29 MED ORDER — ZOLEDRONIC ACID 4 MG/100ML IV SOLN
4.0000 mg | Freq: Once | INTRAVENOUS | Status: DC
  Filled 2012-05-29: qty 100

## 2012-05-29 NOTE — Telephone Encounter (Signed)
Verified rx/pharmacy with pt and sent refills to [facility]: #90 x 1 refill each.

## 2012-05-29 NOTE — Patient Instructions (Signed)
Zoledronic Acid injection (Hypercalcemia, Oncology) What is this medicine? ZOLEDRONIC ACID (ZOE le dron ik AS id) lowers the amount of calcium loss from bone. It is used to treat too much calcium in your blood from cancer. It is also used to prevent complications of cancer that has spread to the bone. This medicine may be used for other purposes; ask your health care provider or pharmacist if you have questions. What should I tell my health care provider before I take this medicine? They need to know if you have any of these conditions: -aspirin-sensitive asthma -dental disease -kidney disease -an unusual or allergic reaction to zoledronic acid, other medicines, foods, dyes, or preservatives -pregnant or trying to get pregnant -breast-feeding How should I use this medicine? This medicine is for infusion into a vein. It is given by a health care professional in a hospital or clinic setting. Talk to your pediatrician regarding the use of this medicine in children. Special care may be needed. Overdosage: If you think you have taken too much of this medicine contact a poison control center or emergency room at once. NOTE: This medicine is only for you. Do not share this medicine with others. What if I miss a dose? It is important not to miss your dose. Call your doctor or health care professional if you are unable to keep an appointment. What may interact with this medicine? -certain antibiotics given by injection -NSAIDs, medicines for pain and inflammation, like ibuprofen or naproxen -some diuretics like bumetanide, furosemide -teriparatide -thalidomide This list may not describe all possible interactions. Give your health care provider a list of all the medicines, herbs, non-prescription drugs, or dietary supplements you use. Also tell them if you smoke, drink alcohol, or use illegal drugs. Some items may interact with your medicine. What should I watch for while using this medicine? Visit  your doctor or health care professional for regular checkups. It may be some time before you see the benefit from this medicine. Do not stop taking your medicine unless your doctor tells you to. Your doctor may order blood tests or other tests to see how you are doing. Women should inform their doctor if they wish to become pregnant or think they might be pregnant. There is a potential for serious side effects to an unborn child. Talk to your health care professional or pharmacist for more information. You should make sure that you get enough calcium and vitamin D while you are taking this medicine. Discuss the foods you eat and the vitamins you take with your health care professional. Some people who take this medicine have severe bone, joint, and/or muscle pain. This medicine may also increase your risk for a broken thigh bone. Tell your doctor right away if you have pain in your upper leg or groin. Tell your doctor if you have any pain that does not go away or that gets worse. What side effects may I notice from receiving this medicine? Side effects that you should report to your doctor or health care professional as soon as possible: -allergic reactions like skin rash, itching or hives, swelling of the face, lips, or tongue -anxiety, confusion, or depression -breathing problems -changes in vision -feeling faint or lightheaded, falls -jaw burning, cramping, pain -muscle cramps, stiffness, or weakness -trouble passing urine or change in the amount of urine Side effects that usually do not require medical attention (report to your doctor or health care professional if they continue or are bothersome): -bone, joint, or muscle pain -  fever -hair loss -irritation at site where injected -loss of appetite -nausea, vomiting -stomach upset -tired This list may not describe all possible side effects. Call your doctor for medical advice about side effects. You may report side effects to FDA at  1-800-FDA-1088. Where should I keep my medicine? This drug is given in a hospital or clinic and will not be stored at home. NOTE: This sheet is a summary. It may not cover all possible information. If you have questions about this medicine, talk to your doctor, pharmacist, or health care provider.  2012, [employer]. (01/06/2011 9:06:58 AM) 

## 2012-05-29 NOTE — Telephone Encounter (Signed)
Pt has not used [facility] previously. Attempted to reach pt to verify request / pharmacy and left message to return my call.

## 2012-05-30 ENCOUNTER — Encounter: Payer: Self-pay | Admitting: Hematology & Oncology

## 2012-06-19 ENCOUNTER — Telehealth: Payer: Self-pay | Admitting: Hematology & Oncology

## 2012-06-19 NOTE — Telephone Encounter (Addendum)
Message copied by Cathi Roan on Wed Jun 19, 2012  1:33 PM ------      Message from: Bruno, Virginia N      Created: Tue May 28, 2012  1:39 PM                   ----- Message -----         From: Josph Macho, MD         Sent: 05/24/2012   9:24 PM           To: Nanci Pina Nurse Hp            Call - labs are normal!!!  Cindee Lame  06-19-12 13:32-  Called pt on  home phone and left message regarding above MD note, advised pt if any questions to call office. Lupita Raider LPN

## 2012-07-30 ENCOUNTER — Ambulatory Visit: Payer: Medicare Other | Admitting: Family

## 2012-08-06 ENCOUNTER — Ambulatory Visit (INDEPENDENT_AMBULATORY_CARE_PROVIDER_SITE_OTHER): Payer: Medicare Other | Admitting: Family

## 2012-08-06 ENCOUNTER — Encounter: Payer: Self-pay | Admitting: Family

## 2012-08-06 VITALS — BP 128/80 | HR 73 | Temp 98.2°F | Resp 16 | Ht 60.0 in | Wt 148.0 lb

## 2012-08-06 DIAGNOSIS — E785 Hyperlipidemia, unspecified: Secondary | ICD-10-CM

## 2012-08-06 DIAGNOSIS — F17200 Nicotine dependence, unspecified, uncomplicated: Secondary | ICD-10-CM

## 2012-08-06 DIAGNOSIS — M810 Age-related osteoporosis without current pathological fracture: Secondary | ICD-10-CM | POA: Diagnosis not present

## 2012-08-06 LAB — HEPATIC FUNCTION PANEL
ALT: 10 U/L (ref 0–35)
AST: 11 U/L (ref 0–37)
Albumin: 4.4 g/dL (ref 3.5–5.2)
Total Protein: 6.2 g/dL (ref 6.0–8.3)

## 2012-08-06 LAB — LIPID PANEL: Cholesterol: 138 mg/dL (ref 0–200)

## 2012-08-06 MED ORDER — OMEPRAZOLE 20 MG PO CPDR: 20.0000 mg | DELAYED_RELEASE_CAPSULE | Freq: Two times a day (BID) | ORAL | Status: DC

## 2012-08-06 MED ORDER — ATORVASTATIN CALCIUM 40 MG PO TABS: 40.0000 mg | ORAL_TABLET | Freq: Every day | ORAL | Status: DC

## 2012-08-06 NOTE — Progress Notes (Signed)
Subjective:    Patient ID: Judith Flynn, female    DOB: 30-Jul-1940, 72 y.o.   MRN: [id number]  HPI  Hyperlipidemia- denies myalgia.   HTN-  Eating los sodium diet.  Tobacco abuse- she is "determined to quit."   Wants to try   Osteoporosis- had Reclast in October.      Review of Systems See HPI  Past Medical History  Diagnosis Date  . Colon polyp   . Hearing loss   . Osteoporosis   . Tobacco abuse   . Cancer 2000    breast- left breast- s/p lumpectomy and radiation    History   Social History  . Marital Status: Widowed    Spouse Name: N/A    Number of Children: 3  . Years of Education: N/A   Occupational History  . retired    Social History Main Topics  . Smoking status: Current Every Day Smoker -- 0.5 packs/day    Types: Cigarettes  . Smokeless tobacco: Not on file  . Alcohol Use: Not on file  . Drug Use: Not on file  . Sexually Active: Not on file   Other Topics Concern  . Not on file   Social History Narrative   Widow/ widowerRegular exercise- yesOriginally from [place]    Past Surgical History  Procedure Date  . Breast biopsy 2000  . Surgery on ear drum     right  . Breast lumpectomy 2000    left    Family History  Problem Relation Age of Onset  . Alcohol abuse Mother   . Arthritis Mother   . Hypertension Mother 57  . Heart disease Mother     CHF  . Alcohol abuse Father   . Arthritis Father   . Hypertension Father 80  . Heart attack Father     Allergies  Allergen Reactions  . Lisinopril     REACTION: cough    Current Outpatient Prescriptions on File Prior to Visit  Medication Sig Dispense Refill  . acetaminophen (TYLENOL) 325 MG tablet Take 650 mg by mouth every 6 (six) hours as needed.        Marland Kitchen atorvastatin (LIPITOR) 40 MG tablet Take 1 tablet (40 mg total) by mouth daily.  90 tablet  1  . Calcium Carbonate-Vit D-Min (CALCIUM 1200) 1200-1000 MG-UNIT CHEW Chew 1 tablet by mouth daily.       . Ergocalciferol (VITAMIN D2)  2000 UNITS TABS Take 1 tablet by mouth daily.        Marland Kitchen omeprazole (PRILOSEC) 20 MG capsule Take 1 capsule (20 mg total) by mouth 2 (two) times daily.  180 capsule  1  . zoledronic acid (RECLAST) 5 MG/100ML SOLN Inject 5 mg into the vein. Annual infusion        BP 128/80  Pulse 73  Temp 98.2 F (36.8 C) (Oral)  Resp 16  Ht 5' (1.524 m)  Wt 148 lb (67.132 kg)  BMI 28.90 kg/m2  SpO2 96%        Objective:   Physical Exam  Constitutional: She appears well-developed and well-nourished. No distress.  Cardiovascular: Normal rate and regular rhythm.   No murmur heard. Pulmonary/Chest: Effort normal and breath sounds normal. No respiratory distress. She has no wheezes. She has no rales. She exhibits no tenderness.  Musculoskeletal: She exhibits no edema.  Psychiatric: She has a normal mood and affect. Her behavior is normal. Judgment and thought content normal.          Assessment &  Flynn:

## 2012-08-06 NOTE — Assessment & Plan Note (Signed)
She is up to date on reclast.  Continue caltrate/vit d.

## 2012-08-06 NOTE — Addendum Note (Signed)
Addended by: Regis Bill on: 08/06/2012 09:07 AM   Modules accepted: Orders

## 2012-08-06 NOTE — Assessment & Plan Note (Signed)
We discussed importance of smoking cessation. She plans to use OTC nicotine patch.

## 2012-08-06 NOTE — Assessment & Plan Note (Signed)
Tolerating statin.  Just completed lft/flp this am, await results.

## 2012-08-07 ENCOUNTER — Encounter: Payer: Self-pay | Admitting: Family

## 2013-02-05 ENCOUNTER — Ambulatory Visit: Payer: Medicare Other | Admitting: Family

## 2013-03-03 ENCOUNTER — Ambulatory Visit: Payer: Medicare Other | Admitting: Family

## 2013-03-10 ENCOUNTER — Ambulatory Visit (INDEPENDENT_AMBULATORY_CARE_PROVIDER_SITE_OTHER): Payer: Medicare Other | Admitting: Family

## 2013-03-10 ENCOUNTER — Encounter: Payer: Self-pay | Admitting: Family

## 2013-03-10 VITALS — BP 110/70 | HR 68 | Temp 98.4°F | Resp 16 | Ht 60.0 in | Wt 145.0 lb

## 2013-03-10 DIAGNOSIS — E559 Vitamin D deficiency, unspecified: Secondary | ICD-10-CM

## 2013-03-10 DIAGNOSIS — E785 Hyperlipidemia, unspecified: Secondary | ICD-10-CM

## 2013-03-10 DIAGNOSIS — I10 Essential (primary) hypertension: Secondary | ICD-10-CM

## 2013-03-10 MED ORDER — OMEPRAZOLE 20 MG PO CPDR: 20.0000 mg | DELAYED_RELEASE_CAPSULE | Freq: Two times a day (BID) | ORAL | Status: DC

## 2013-03-10 MED ORDER — ATORVASTATIN CALCIUM 40 MG PO TABS: 40.0000 mg | ORAL_TABLET | Freq: Every day | ORAL | Status: DC

## 2013-03-10 NOTE — Assessment & Plan Note (Signed)
BP stable, not on meds. Continue low sodium diet, monitor.

## 2013-03-10 NOTE — Patient Instructions (Addendum)
Please follow up in 3 months.  Complete lab work on Wednesday.

## 2013-03-10 NOTE — Assessment & Plan Note (Signed)
On vitamin D supplement.  Attempted to order vitamin D level but could not get order to process due to medicare and frequency of testing. Plan to check next visit.

## 2013-03-10 NOTE — Assessment & Plan Note (Signed)
Lipids stable on statin.  Check LFT.

## 2013-03-10 NOTE — Progress Notes (Signed)
Subjective:    Patient ID: Judith Flynn, female    DOB: [birth year], 72 y.o.   MRN: [id number]  HPI  Judith Flynn is a 72 yr old female who presents today for follow up of multiple medical problems.  1) HTN- she is currently maintained on diet controlled.  Reports not cp, sob, swelling.   2) Hyperlipidemia- currently on atorvastatin.  Denies myalgia.  3) GERD- maintained on omeprazole. Reports well controlled. No new controlled.     Review of Systems See HPI  Past Medical History  Diagnosis Date  . Colon polyp   . Hearing loss   . Osteoporosis   . Tobacco abuse   . Cancer 2000    breast- left breast- s/p lumpectomy and radiation    History   Social History  . Marital Status: Widowed    Spouse Name: N/A    Number of Children: 3  . Years of Education: N/A   Occupational History  . retired    Social History Main Topics  . Smoking status: Current Every Day Smoker -- 0.50 packs/day    Types: Cigarettes  . Smokeless tobacco: Not on file  . Alcohol Use: Not on file  . Drug Use: Not on file  . Sexual Activity: Not on file   Other Topics Concern  . Not on file   Social History Narrative   Widow/ widower   Regular exercise- yes   Originally from [place]          Past Surgical History  Procedure Laterality Date  . Breast biopsy  2000  . Surgery on ear drum      right  . Breast lumpectomy  2000    left    Family History  Problem Relation Age of Onset  . Alcohol abuse Mother   . Arthritis Mother   . Hypertension Mother 59  . Heart disease Mother     CHF  . Alcohol abuse Father   . Arthritis Father   . Hypertension Father 35  . Heart attack Father     Allergies  Allergen Reactions  . Lisinopril     REACTION: cough    Current Outpatient Prescriptions on File Prior to Visit  Medication Sig Dispense Refill  . acetaminophen (TYLENOL) 325 MG tablet Take 650 mg by mouth every 6 (six) hours as needed.        . Calcium Carbonate-Vit D-Min (CALCIUM  1200) 1200-1000 MG-UNIT CHEW Chew 1 tablet by mouth daily.       . Ergocalciferol (VITAMIN D2) 2000 UNITS TABS Take 1 tablet by mouth daily.        . zoledronic acid (RECLAST) 5 MG/100ML SOLN Inject 5 mg into the vein. Annual infusion       No current facility-administered medications on file prior to visit.    BP 110/70  Pulse 68  Temp(Src) 98.4 F (36.9 C) (Oral)  Resp 16  Ht 5' (1.524 m)  Wt 145 lb (65.772 kg)  BMI 28.32 kg/m2  SpO2 97%       Objective:   Physical Exam  Constitutional: She is oriented to person, place, and time. She appears well-developed and well-nourished. No distress.  Cardiovascular: Normal rate and regular rhythm.   No murmur heard. Pulmonary/Chest: Effort normal and breath sounds normal. No respiratory distress. She has no wheezes. She has no rales. She exhibits no tenderness.  Musculoskeletal: She exhibits no edema.  Neurological: She is alert and oriented to person, place, and time.  Psychiatric: She has  a normal mood and affect. Her behavior is normal. Judgment and thought content normal.          Assessment & Flynn:

## 2013-03-12 ENCOUNTER — Other Ambulatory Visit: Payer: Self-pay | Admitting: Family

## 2013-03-12 ENCOUNTER — Ambulatory Visit (HOSPITAL_BASED_OUTPATIENT_CLINIC_OR_DEPARTMENT_OTHER)
Admission: RE | Admit: 2013-03-12 | Discharge: 2013-03-12 | Disposition: A | Discharge status: Home / Self Care | Payer: Medicare Other | Source: Ambulatory Visit | Attending: Family | Admitting: Family

## 2013-03-12 ENCOUNTER — Encounter: Payer: Self-pay | Admitting: Family

## 2013-03-12 DIAGNOSIS — Z1231 Encounter for screening mammogram for malignant neoplasm of breast: Secondary | ICD-10-CM | POA: Insufficient documentation

## 2013-03-12 DIAGNOSIS — E785 Hyperlipidemia, unspecified: Secondary | ICD-10-CM | POA: Diagnosis not present

## 2013-03-12 LAB — HEPATIC FUNCTION PANEL
ALT: 8 U/L (ref 0–35)
AST: 11 U/L (ref 0–37)
Bilirubin, Direct: 0.1 mg/dL (ref 0.0–0.3)
Indirect Bilirubin: 0.3 mg/dL (ref 0.0–0.9)
Total Bilirubin: 0.4 mg/dL (ref 0.3–1.2)

## 2013-03-12 IMAGING — MG MM DIGITAL SCREENING
4 series · 4 of 4 positions shown · non-contrast
Comparison: Previous exam(s).

CLINICAL DATA: Screening.

DIGITAL SCREENING BILATERAL MAMMOGRAM WITH CAD

[R CC]
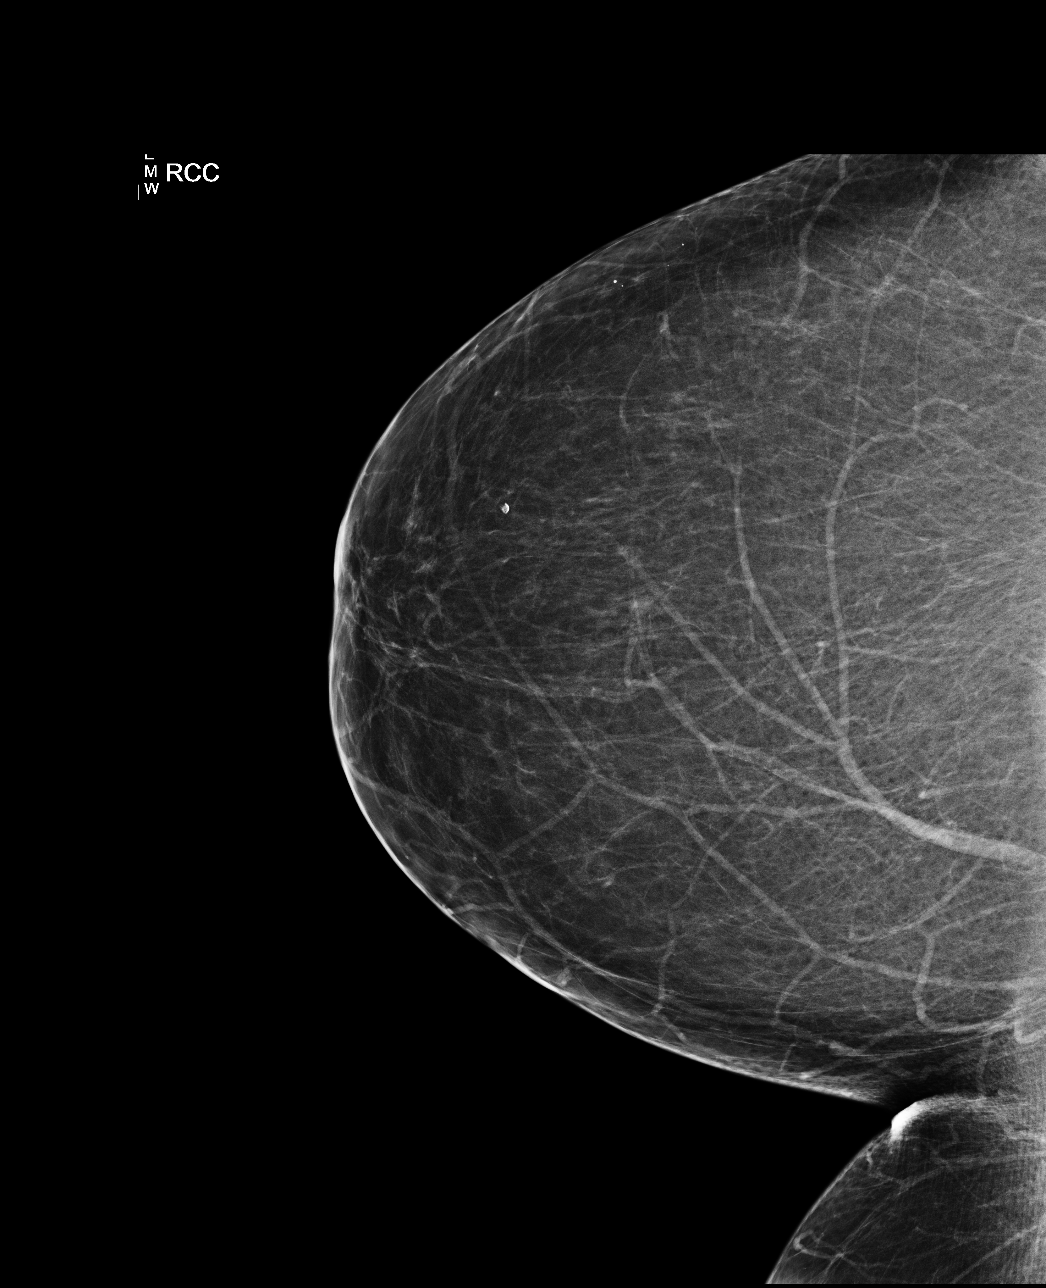

[L CC]
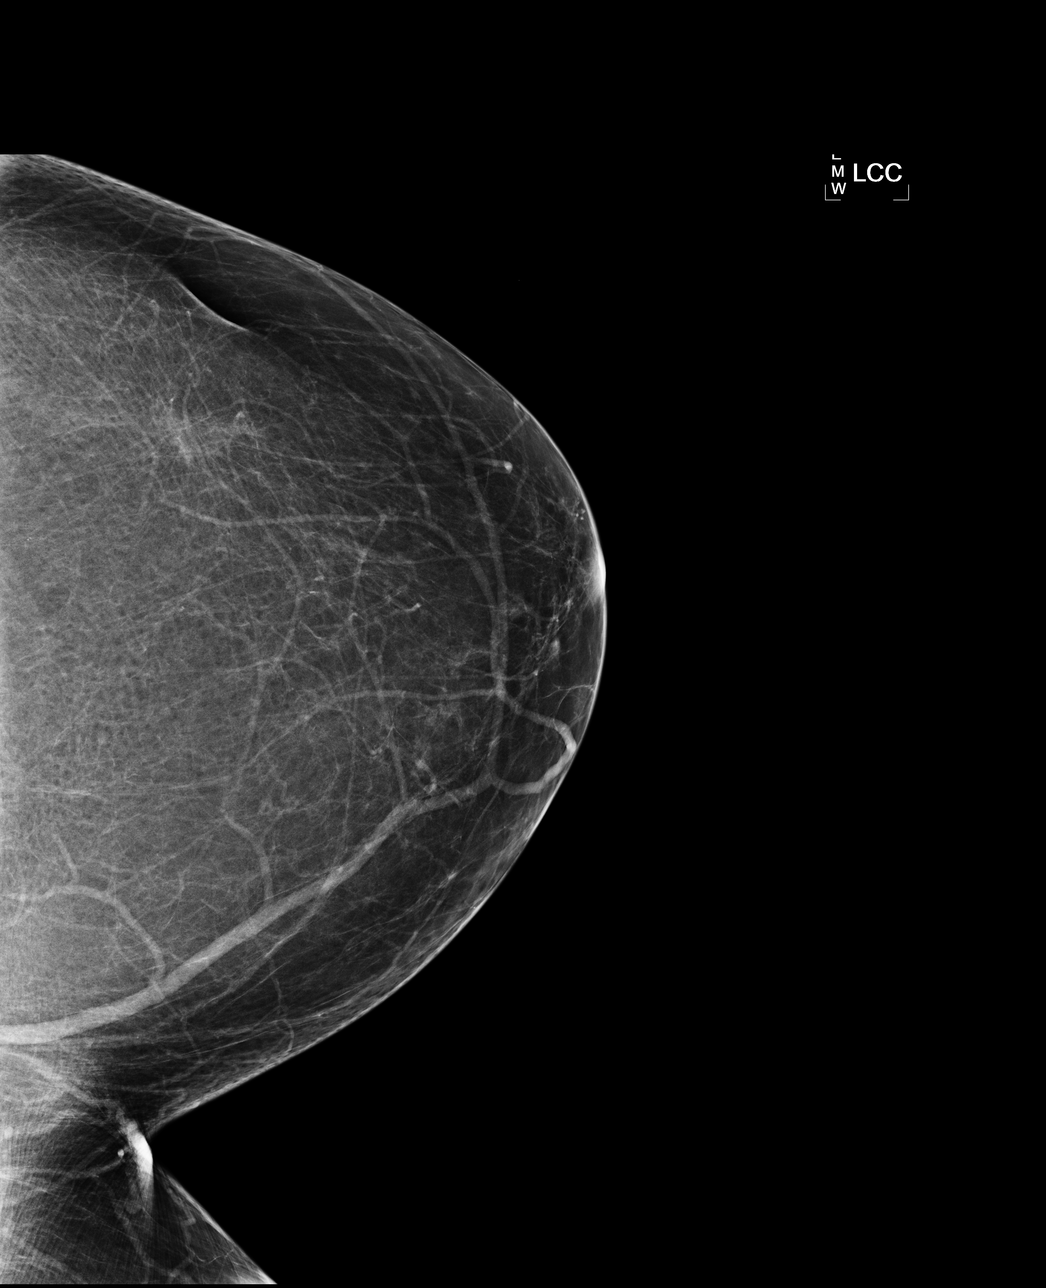

[L MLO]
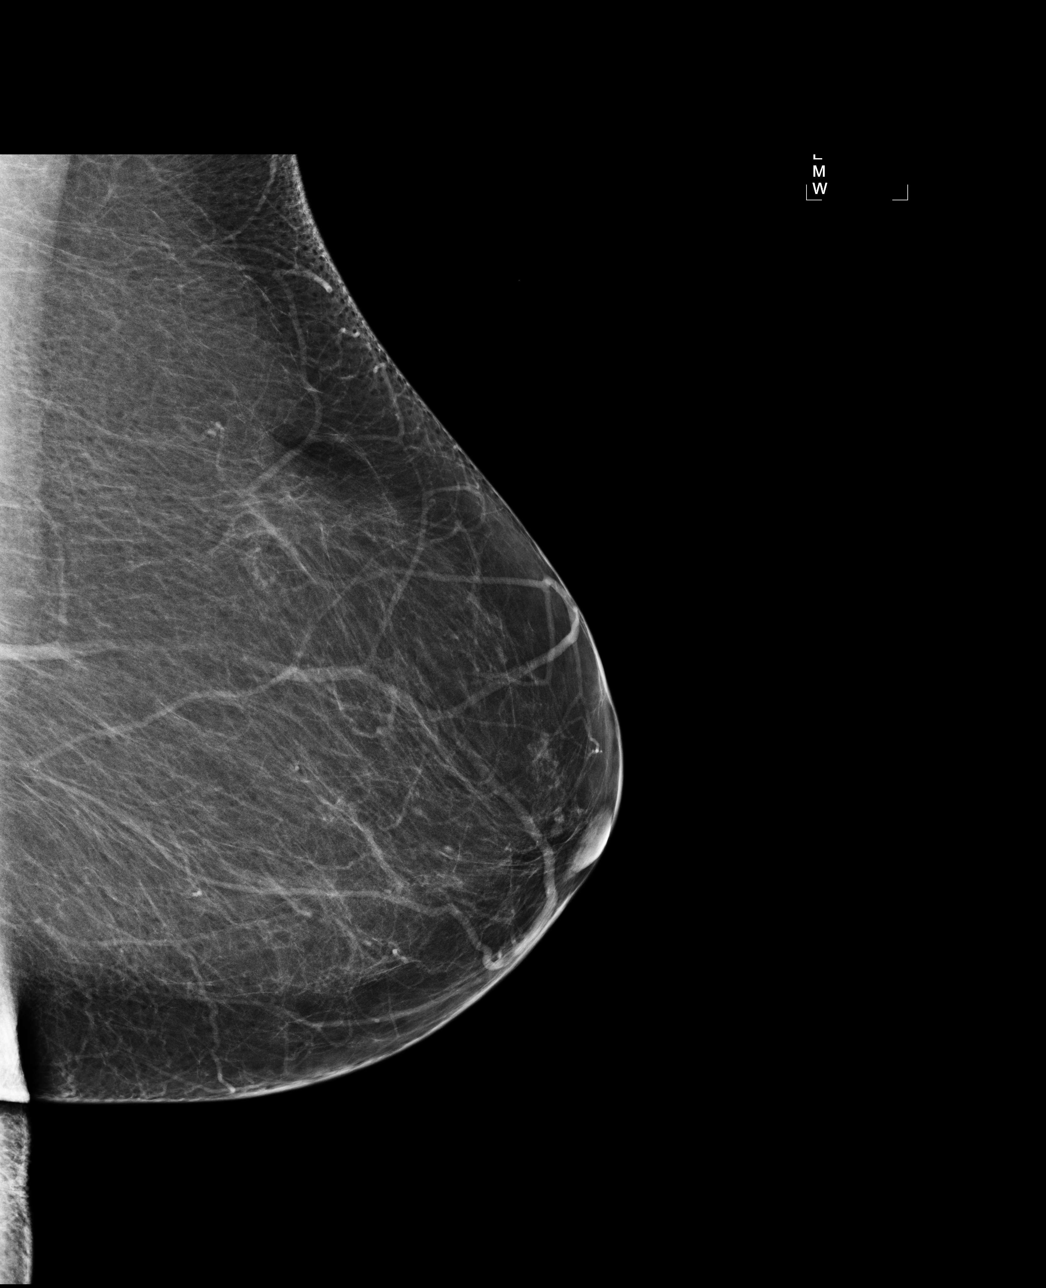

[R MLO]
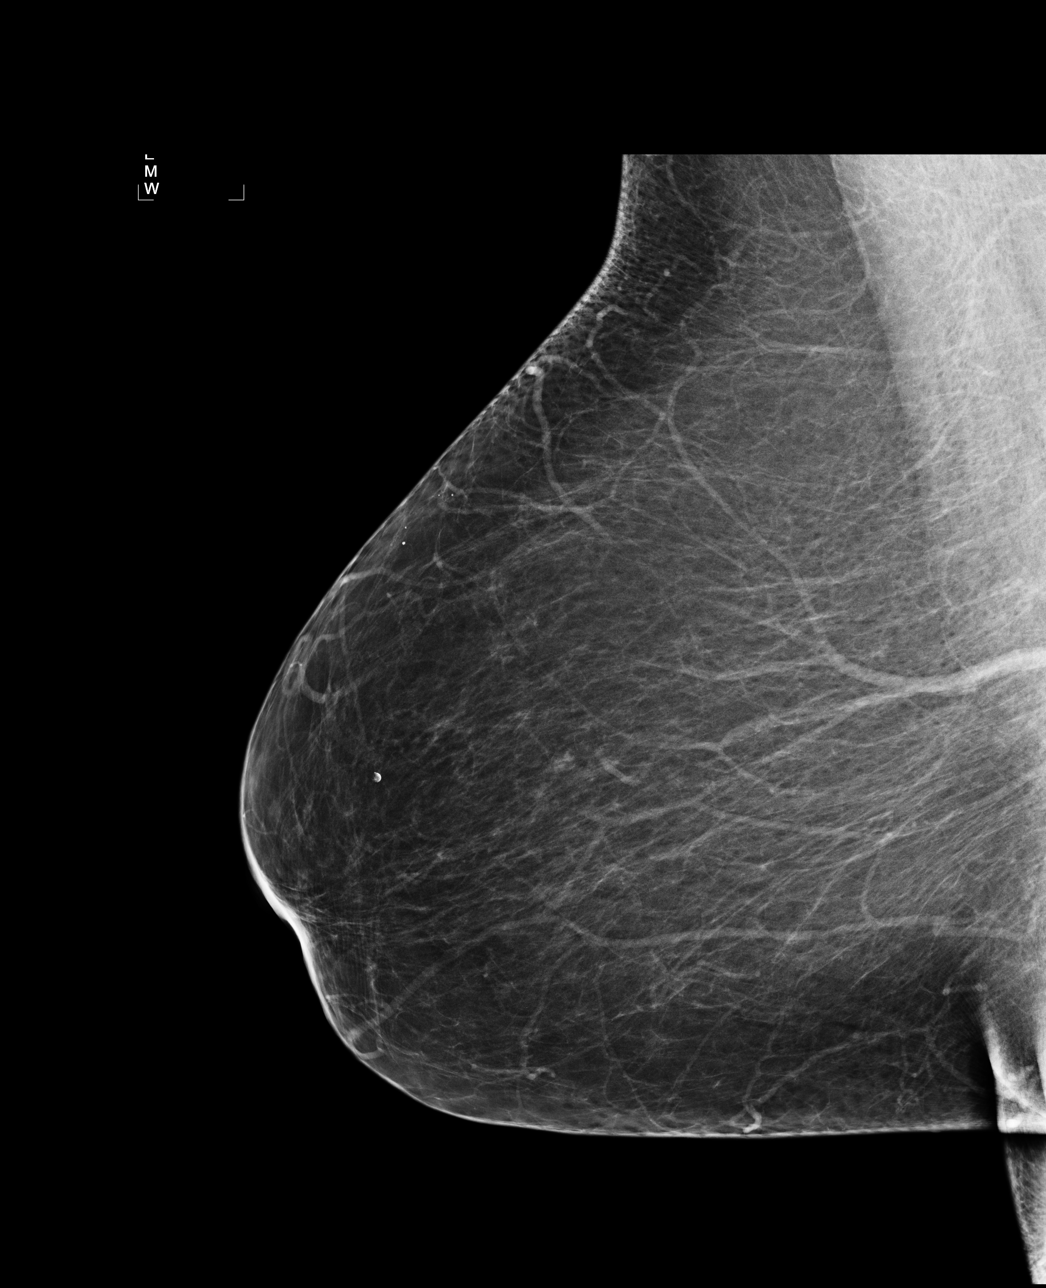

[4 of 4 positions shown; findings below may reference images not displayed]

FINDINGS: ACR Breast Density Category a:  The breast tissue is almost
entirely fatty.

There are no findings suspicious for malignancy.

Images were processed with CAD.
IMPRESSION: No mammographic evidence of malignancy.

A result letter of this screening mammogram will be mailed directly
to the patient.

RECOMMENDATION:
Screening mammogram in one year. (Code:H9-4-W6G)

BI-RADS CATEGORY 1:  Negative.

## 2013-05-23 ENCOUNTER — Other Ambulatory Visit (HOSPITAL_BASED_OUTPATIENT_CLINIC_OR_DEPARTMENT_OTHER): Payer: Medicare Other | Admitting: Lab

## 2013-05-23 ENCOUNTER — Ambulatory Visit (HOSPITAL_BASED_OUTPATIENT_CLINIC_OR_DEPARTMENT_OTHER): Payer: Medicare Other

## 2013-05-23 ENCOUNTER — Other Ambulatory Visit: Payer: Medicare Other | Admitting: Lab

## 2013-05-23 ENCOUNTER — Ambulatory Visit (HOSPITAL_BASED_OUTPATIENT_CLINIC_OR_DEPARTMENT_OTHER): Payer: Medicare Other | Admitting: Hematology & Oncology

## 2013-05-23 ENCOUNTER — Ambulatory Visit: Payer: Medicare Other | Admitting: Medical

## 2013-05-23 VITALS — BP 147/77 | HR 70 | Temp 98.3°F | Resp 14 | Ht 60.0 in | Wt 149.0 lb

## 2013-05-23 DIAGNOSIS — Z853 Personal history of malignant neoplasm of breast: Secondary | ICD-10-CM

## 2013-05-23 DIAGNOSIS — E559 Vitamin D deficiency, unspecified: Secondary | ICD-10-CM | POA: Diagnosis not present

## 2013-05-23 DIAGNOSIS — M810 Age-related osteoporosis without current pathological fracture: Secondary | ICD-10-CM

## 2013-05-23 DIAGNOSIS — C50919 Malignant neoplasm of unspecified site of unspecified female breast: Secondary | ICD-10-CM

## 2013-05-23 DIAGNOSIS — D0512 Intraductal carcinoma in situ of left breast: Secondary | ICD-10-CM

## 2013-05-23 LAB — CBC WITH DIFFERENTIAL (CANCER CENTER ONLY)
Eosinophils Absolute: 0.1 10*3/uL (ref 0.0–0.5)
LYMPH%: 29 % (ref 14.0–48.0)
MCV: 94 fL (ref 81–101)
MONO#: 0.5 10*3/uL (ref 0.1–0.9)
NEUT#: 3.5 10*3/uL (ref 1.5–6.5)
Platelets: 178 10*3/uL (ref 145–400)
RBC: 4.29 10*6/uL (ref 3.70–5.32)
WBC: 5.7 10*3/uL (ref 3.9–10.0)

## 2013-05-23 MED ORDER — ZOLEDRONIC ACID 4 MG/100ML IV SOLN
4.0000 mg | Freq: Once | INTRAVENOUS | Status: AC
  Administered 2013-05-23: 4 mg via INTRAVENOUS
  Filled 2013-05-23: qty 100

## 2013-05-23 NOTE — Progress Notes (Signed)
This office note has been dictated.

## 2013-05-23 NOTE — Patient Instructions (Signed)
Zoledronic Acid injection (Hypercalcemia, Oncology) What is this medicine? ZOLEDRONIC ACID (ZOE le dron ik AS id) lowers the amount of calcium loss from bone. It is used to treat too much calcium in your blood from cancer. It is also used to prevent complications of cancer that has spread to the bone. This medicine may be used for other purposes; ask your health care provider or pharmacist if you have questions. What should I tell my health care provider before I take this medicine? They need to know if you have any of these conditions: -aspirin-sensitive asthma -dental disease -kidney disease -an unusual or allergic reaction to zoledronic acid, other medicines, foods, dyes, or preservatives -pregnant or trying to get pregnant -breast-feeding How should I use this medicine? This medicine is for infusion into a vein. It is given by a health care professional in a hospital or clinic setting. Talk to your pediatrician regarding the use of this medicine in children. Special care may be needed. Overdosage: If you think you have taken too much of this medicine contact a poison control center or emergency room at once. NOTE: This medicine is only for you. Do not share this medicine with others. What if I miss a dose? It is important not to miss your dose. Call your doctor or health care professional if you are unable to keep an appointment. What may interact with this medicine? -certain antibiotics given by injection -NSAIDs, medicines for pain and inflammation, like ibuprofen or naproxen -some diuretics like bumetanide, furosemide -teriparatide -thalidomide This list may not describe all possible interactions. Give your health care provider a list of all the medicines, herbs, non-prescription drugs, or dietary supplements you use. Also tell them if you smoke, drink alcohol, or use illegal drugs. Some items may interact with your medicine. What should I watch for while using this medicine? Visit  your doctor or health care professional for regular checkups. It may be some time before you see the benefit from this medicine. Do not stop taking your medicine unless your doctor tells you to. Your doctor may order blood tests or other tests to see how you are doing. Women should inform their doctor if they wish to become pregnant or think they might be pregnant. There is a potential for serious side effects to an unborn child. Talk to your health care professional or pharmacist for more information. You should make sure that you get enough calcium and vitamin D while you are taking this medicine. Discuss the foods you eat and the vitamins you take with your health care professional. Some people who take this medicine have severe bone, joint, and/or muscle pain. This medicine may also increase your risk for a broken thigh bone. Tell your doctor right away if you have pain in your upper leg or groin. Tell your doctor if you have any pain that does not go away or that gets worse. What side effects may I notice from receiving this medicine? Side effects that you should report to your doctor or health care professional as soon as possible: -allergic reactions like skin rash, itching or hives, swelling of the face, lips, or tongue -anxiety, confusion, or depression -breathing problems -changes in vision -feeling faint or lightheaded, falls -jaw burning, cramping, pain -muscle cramps, stiffness, or weakness -trouble passing urine or change in the amount of urine Side effects that usually do not require medical attention (report to your doctor or health care professional if they continue or are bothersome): -bone, joint, or muscle pain -  fever -hair loss -irritation at site where injected -loss of appetite -nausea, vomiting -stomach upset -tired This list may not describe all possible side effects. Call your doctor for medical advice about side effects. You may report side effects to FDA at  1-800-FDA-1088. Where should I keep my medicine? This drug is given in a hospital or clinic and will not be stored at home. NOTE: This sheet is a summary. It may not cover all possible information. If you have questions about this medicine, talk to your doctor, pharmacist, or health care provider.  2013, [employer] Standard. (01/06/2011 9:06:58 AM)  

## 2013-05-24 LAB — COMPREHENSIVE METABOLIC PANEL
Albumin: 4.2 g/dL (ref 3.5–5.2)
Alkaline Phosphatase: 53 U/L (ref 39–117)
CO2: 28 mEq/L (ref 19–32)
Calcium: 9.3 mg/dL (ref 8.4–10.5)
Chloride: 108 mEq/L (ref 96–112)
Creatinine, Ser: 0.85 mg/dL (ref 0.50–1.10)
Glucose, Bld: 91 mg/dL (ref 70–99)
Potassium: 4 mEq/L (ref 3.5–5.3)
Sodium: 143 mEq/L (ref 135–145)
Total Protein: 6.4 g/dL (ref 6.0–8.3)

## 2013-05-24 LAB — VITAMIN D 25 HYDROXY (VIT D DEFICIENCY, FRACTURES): Vit D, 25-Hydroxy: 48 ng/mL (ref 30–89)

## 2013-05-24 NOTE — Progress Notes (Signed)
DIAGNOSES: 1. Ductal carcinoma in situ of the left breast. 2. Osteoporosis.  CURRENT THERAPY:  Reclast being 5 mg IV q. year.  INTERIM HISTORY:  Judith Flynn comes in for her followup.  We see her yearly.  She had her last mammogram back in August.  Everything looked okay; it looked okay on the mammogram.  She has had no problems since we last saw her.  She traveled quite a bit during this past year.  She has a lot of family.  They are scattered all over the [facility].  She drove to all of these places where her family is.  She has had some back issues.  This has been chronic for her.  There has been no change in bowel or bladder habits.  She has had no cough or shortness breath.  She has had no rashes.  There have been no fever, sweats, or chills.  PHYSICAL EXAMINATION:  General:  This is a well-developed, well- nourished white female, in no obvious distress.  Vital signs:  Show a temperature of 98.3, pulse 70, respiratory rate 14, blood pressure 147/77.  Weight is 149.  Head and Neck:  Shows a normocephalic, atraumatic skull.  There are no ocular or oral lesions.  Neck:  She has no palpable cervical or supraclavicular lymph nodes.  Lungs:  Clear bilaterally.  Cardiac:  Regular rate and rhythm with a normal S1, S2. There are no murmurs, rubs or bruits.  Breasts:  Show right breast with no masses, edema, or erythema.  There is no right axillary adenopathy. Left breast shows well-healed lumpectomy at the 2 o'clock position. There is some slight hyperpigmentation from radiation.  There is some slight firmness at the lumpectomy site.  There is no obvious left breast mass.  There is no left axillary adenopathy.  Abdomen:  Soft.  She has good bowel sounds.  There is no fluid wave.  No palpable hepatosplenomegaly.  Extremities:  Show no clubbing, cyanosis, or edema. Neurologic:  No focal neurological deficits.  Skin:  No rashes, ecchymosis, or petechia.  LABORATORY STUDIES:  White  cell count 5.7, hemoglobin 13.6, hematocrit 40.2, platelet count 178.  IMPRESSION:  Judith Flynn is a very nice 72 year old white female with a history of ductal carcinoma in situ of the left breast.  She underwent lumpectomy and radiation.  She completed tamoxifen for 5 years back in 2006.  We will go ahead and get her back in 1 more year.  I do not see any issues with invasive disease or other problems.   ______________________________ Josph Macho, M.D. PRE/MEDQ  D:  05/23/2013  T:  05/24/2013  Job:  [id number]

## 2013-05-26 ENCOUNTER — Telehealth: Payer: Self-pay | Admitting: Nurse Practitioner

## 2013-05-26 NOTE — Telephone Encounter (Addendum)
Message copied by Glee Arvin on Mon May 26, 2013 11:36 AM ------      Message from: Josph Macho      Created: Sun May 25, 2013  8:07 PM       Call - labs and vit D are ok!!  Cindee Lame ------LVM on pt's home machine informing her of the above message.

## 2013-06-10 ENCOUNTER — Encounter: Payer: Self-pay | Admitting: Family

## 2013-06-10 ENCOUNTER — Ambulatory Visit (INDEPENDENT_AMBULATORY_CARE_PROVIDER_SITE_OTHER): Payer: Medicare Other | Admitting: Family

## 2013-06-10 VITALS — BP 138/86 | HR 72 | Temp 98.2°F | Resp 16 | Ht 60.0 in | Wt 152.0 lb

## 2013-06-10 DIAGNOSIS — F17200 Nicotine dependence, unspecified, uncomplicated: Secondary | ICD-10-CM

## 2013-06-10 DIAGNOSIS — I10 Essential (primary) hypertension: Secondary | ICD-10-CM

## 2013-06-10 DIAGNOSIS — M810 Age-related osteoporosis without current pathological fracture: Secondary | ICD-10-CM

## 2013-06-10 DIAGNOSIS — E559 Vitamin D deficiency, unspecified: Secondary | ICD-10-CM

## 2013-06-10 DIAGNOSIS — Z23 Encounter for immunization: Secondary | ICD-10-CM

## 2013-06-10 DIAGNOSIS — K219 Gastro-esophageal reflux disease without esophagitis: Secondary | ICD-10-CM

## 2013-06-10 MED ORDER — BUPROPION HCL ER (SMOKING DET) 150 MG PO TB12: 150.0000 mg | ORAL_TABLET | Freq: Two times a day (BID) | ORAL | Status: DC

## 2013-06-10 NOTE — Progress Notes (Signed)
Pre visit review using our clinic review tool, if applicable. No additional management support is needed unless otherwise documented below in the visit note/SLS  

## 2013-06-10 NOTE — Assessment & Plan Note (Signed)
Recently had reclast infusion with Dr. Myna Hidalgo.

## 2013-06-10 NOTE — Assessment & Plan Note (Signed)
Normal.  Continue daily vitamin D supplement.

## 2013-06-10 NOTE — Assessment & Plan Note (Signed)
Continue prilosec

## 2013-06-10 NOTE — Patient Instructions (Addendum)
Please follow up in 6 weeks so we can see how you are

## 2013-06-10 NOTE — Assessment & Plan Note (Signed)
BP stable off of meds.  Monitor.  

## 2013-06-10 NOTE — Progress Notes (Signed)
Subjective:    Patient ID: Judith Flynn, female    DOB: 1941-04-30, 72 y.o.   MRN: [id number]  HPI  Judith Flynn is a 72 yr old female who presents today for follow up.  1) HTN- BP Readings from Last 3 Encounters:  06/10/13 138/86  05/23/13 147/77  03/10/13 110/70   She is not currently on antihypertensive.    2) Osteoporosis- she is on reclast. She had completed several weeks ago.    3) Tobacco abuse- Continues to smoke.  Interested in zyban.   4) GERD-She continues prilosec.    5) Vitamin D deficiency-recently checked by Dr. Myna Hidalgo and had normal level.    Review of Systems See HPI  Past Medical History  Diagnosis Date  . Colon polyp   . Hearing loss   . Osteoporosis   . Tobacco abuse   . Cancer 2000    breast- left breast- s/p lumpectomy and radiation    History   Social History  . Marital Status: Widowed    Spouse Name: N/A    Number of Children: 3  . Years of Education: N/A   Occupational History  . retired    Social History Main Topics  . Smoking status: Current Every Day Smoker -- 0.50 packs/day    Types: Cigarettes  . Smokeless tobacco: Not on file  . Alcohol Use: Not on file  . Drug Use: Not on file  . Sexual Activity: Not on file   Other Topics Concern  . Not on file   Social History Narrative   Widow/ widower   Regular exercise- yes   Originally from [place]          Past Surgical History  Procedure Laterality Date  . Breast biopsy  2000  . Surgery on ear drum      right  . Breast lumpectomy  2000    left    Family History  Problem Relation Age of Onset  . Alcohol abuse Mother   . Arthritis Mother   . Hypertension Mother 9  . Heart disease Mother     CHF  . Alcohol abuse Father   . Arthritis Father   . Hypertension Father 34  . Heart attack Father     Allergies  Allergen Reactions  . Lisinopril     REACTION: cough    Current Outpatient Prescriptions on File Prior to Visit  Medication Sig Dispense Refill  .  acetaminophen (TYLENOL) 325 MG tablet Take 650 mg by mouth every 6 (six) hours as needed.        Marland Kitchen atorvastatin (LIPITOR) 40 MG tablet Take 1 tablet (40 mg total) by mouth daily.  90 tablet  1  . Calcium Carbonate-Vit D-Min (CALCIUM 1200) 1200-1000 MG-UNIT CHEW Chew 1 tablet by mouth daily.       . Ergocalciferol (VITAMIN D2) 2000 UNITS TABS Take 1 tablet by mouth daily.        Marland Kitchen omeprazole (PRILOSEC) 20 MG capsule Take 1 capsule (20 mg total) by mouth 2 (two) times daily.  180 capsule  1  . zoledronic acid (RECLAST) 5 MG/100ML SOLN Inject 5 mg into the vein. Annual infusion       No current facility-administered medications on file prior to visit.    BP 138/86  Pulse 72  Temp(Src) 98.2 F (36.8 C) (Oral)  Resp 16  Ht 5' (1.524 m)  Wt 152 lb (68.947 kg)  BMI 29.69 kg/m2  SpO2 97%       Objective:  Physical Exam  Constitutional: She is oriented to person, place, and time. She appears well-developed and well-nourished. No distress.  Cardiovascular: Normal rate and regular rhythm.   No murmur heard. Pulmonary/Chest: Effort normal and breath sounds normal. No respiratory distress. She has no wheezes. She has no rales. She exhibits no tenderness.  Musculoskeletal: She exhibits no edema.  Neurological: She is alert and oriented to person, place, and time.  Psychiatric: She has a normal mood and affect. Her behavior is normal. Judgment and thought content normal.          Assessment & Flynn:

## 2013-06-10 NOTE — Assessment & Plan Note (Signed)
She is interested in quitting. Wants to try zyban. Discussed need to go to ED if SI/HI occur.  Pt verbalizes understanding.

## 2013-07-30 ENCOUNTER — Ambulatory Visit (INDEPENDENT_AMBULATORY_CARE_PROVIDER_SITE_OTHER): Payer: Medicare Other | Admitting: Family

## 2013-07-30 ENCOUNTER — Encounter: Payer: Self-pay | Admitting: Family

## 2013-07-30 VITALS — BP 142/80 | HR 74 | Temp 97.7°F | Resp 16 | Ht 60.0 in | Wt 147.0 lb

## 2013-07-30 DIAGNOSIS — K219 Gastro-esophageal reflux disease without esophagitis: Secondary | ICD-10-CM | POA: Diagnosis not present

## 2013-07-30 DIAGNOSIS — I10 Essential (primary) hypertension: Secondary | ICD-10-CM | POA: Diagnosis not present

## 2013-07-30 DIAGNOSIS — E785 Hyperlipidemia, unspecified: Secondary | ICD-10-CM | POA: Diagnosis not present

## 2013-07-30 NOTE — Patient Instructions (Signed)
Please complete your lab work prior to leaving. Follow up in 6 months.

## 2013-07-30 NOTE — Assessment & Plan Note (Signed)
Fair BP control. Continue low sodium diet. Monitor.

## 2013-07-30 NOTE — Progress Notes (Signed)
Pre visit review using our clinic review tool, if applicable. No additional management support is needed unless otherwise documented below in the visit note. 

## 2013-07-30 NOTE — Assessment & Plan Note (Signed)
Maintained on PPI, stable. Continue same.

## 2013-07-30 NOTE — Progress Notes (Signed)
Subjective:    Patient ID: Judith Flynn, female    DOB: [birth year], 73 y.o.   MRN: [id number]  HPI  Judith Flynn is a 73 yr old female who presents today for follow up.  HTN- she is maintained on diet alone.   BP Readings from Last 3 Encounters:  07/30/13 142/80  06/10/13 138/86  05/23/13 147/77  Denies CP, SOB, or swelling  GERD- continues prilosec. Reports well controlled on omeprazole.   Review of Systems See HPI  Past Medical History  Diagnosis Date  . Colon polyp   . Hearing loss   . Osteoporosis   . Tobacco abuse   . Cancer 2000    breast- left breast- s/p lumpectomy and radiation    History   Social History  . Marital Status: Widowed    Spouse Name: N/A    Number of Children: 3  . Years of Education: N/A   Occupational History  . retired    Social History Main Topics  . Smoking status: Current Every Day Smoker -- 0.50 packs/day    Types: Cigarettes  . Smokeless tobacco: Not on file  . Alcohol Use: Not on file  . Drug Use: Not on file  . Sexual Activity: Not on file   Other Topics Concern  . Not on file   Social History Narrative   Widow/ widower   Regular exercise- yes   Originally from [place]          Past Surgical History  Procedure Laterality Date  . Breast biopsy  2000  . Surgery on ear drum      right  . Breast lumpectomy  2000    left    Family History  Problem Relation Age of Onset  . Alcohol abuse Mother   . Arthritis Mother   . Hypertension Mother 69  . Heart disease Mother     CHF  . Alcohol abuse Father   . Arthritis Father   . Hypertension Father 79  . Heart attack Father     Allergies  Allergen Reactions  . Lisinopril     REACTION: cough    Current Outpatient Prescriptions on File Prior to Visit  Medication Sig Dispense Refill  . acetaminophen (TYLENOL) 325 MG tablet Take 650 mg by mouth every 6 (six) hours as needed.        Marland Kitchen atorvastatin (LIPITOR) 40 MG tablet Take 1 tablet (40 mg total) by mouth  daily.  90 tablet  1  . buPROPion (ZYBAN) 150 MG 12 hr tablet Take 1 tablet (150 mg total) by mouth 2 (two) times daily.  60 tablet  2  . Calcium Carbonate-Vit D-Min (CALCIUM 1200) 1200-1000 MG-UNIT CHEW Chew 1 tablet by mouth daily.       . Ergocalciferol (VITAMIN D2) 2000 UNITS TABS Take 1 tablet by mouth daily.        Marland Kitchen omeprazole (PRILOSEC) 20 MG capsule Take 1 capsule (20 mg total) by mouth 2 (two) times daily.  180 capsule  1  . zoledronic acid (RECLAST) 5 MG/100ML SOLN Inject 5 mg into the vein. Annual infusion       No current facility-administered medications on file prior to visit.    BP 142/80  Pulse 74  Temp(Src) 97.7 F (36.5 C) (Oral)  Resp 16  Ht 5' (1.524 m)  Wt 147 lb (66.679 kg)  BMI 28.71 kg/m2  SpO2 96%       Objective:   Physical Exam  Constitutional: She is oriented  to person, place, and time. She appears well-developed and well-nourished. No distress.  HENT:  Head: Normocephalic and atraumatic.  Cardiovascular: Normal rate and regular rhythm.   No murmur heard. Pulmonary/Chest: Effort normal and breath sounds normal. No respiratory distress. She has no wheezes. She has no rales. She exhibits no tenderness.  Musculoskeletal: She exhibits no edema.  Neurological: She is alert and oriented to person, place, and time.  Psychiatric: She has a normal mood and affect. Her behavior is normal. Judgment and thought content normal.          Assessment & Plan:

## 2013-07-31 ENCOUNTER — Telehealth: Payer: Self-pay | Admitting: Family

## 2013-07-31 LAB — LIPID PANEL
CHOL/HDL RATIO: 3 ratio
Cholesterol: 201 mg/dL — ABNORMAL HIGH (ref 0–200)
HDL: 66 mg/dL (ref >39)
LDL CALC: 107 mg/dL — AB (ref 0–99)
TRIGLYCERIDES: 140 mg/dL (ref <150)
VLDL: 28 mg/dL (ref 0–40)

## 2013-07-31 NOTE — Telephone Encounter (Signed)
Relevant patient education assigned to patient using Emmi. ° °

## 2013-08-01 ENCOUNTER — Encounter: Payer: Self-pay | Admitting: Family

## 2013-08-02 ENCOUNTER — Other Ambulatory Visit: Payer: Self-pay | Admitting: Family

## 2013-09-16 ENCOUNTER — Telehealth: Payer: Self-pay | Admitting: *Deleted

## 2013-09-16 MED ORDER — OMEPRAZOLE 20 MG PO CPDR: 20.0000 mg | DELAYED_RELEASE_CAPSULE | Freq: Two times a day (BID) | ORAL | Status: DC

## 2013-09-16 NOTE — Telephone Encounter (Signed)
Received fax from [facility] for refill of omeprazole 20mg  twice daily. Rx faxed, #180 x no refill.

## 2013-11-24 ENCOUNTER — Other Ambulatory Visit: Payer: Self-pay | Admitting: Family

## 2013-11-29 DIAGNOSIS — H00019 Hordeolum externum unspecified eye, unspecified eyelid: Secondary | ICD-10-CM | POA: Diagnosis not present

## 2013-12-16 DIAGNOSIS — H2510 Age-related nuclear cataract, unspecified eye: Secondary | ICD-10-CM | POA: Diagnosis not present

## 2013-12-16 DIAGNOSIS — H353190 Nonexudative age-related macular degeneration, unspecified eye, stage unspecified: Secondary | ICD-10-CM | POA: Diagnosis not present

## 2013-12-19 ENCOUNTER — Telehealth: Payer: Self-pay | Admitting: *Deleted

## 2013-12-19 MED ORDER — OMEPRAZOLE 20 MG PO CPDR: 20.0000 mg | DELAYED_RELEASE_CAPSULE | Freq: Two times a day (BID) | ORAL | Status: DC

## 2013-12-19 NOTE — Telephone Encounter (Signed)
Received fax from [facility] requesting refill of Omeprazole. Refill sent, #180 x 1 refills.

## 2014-02-03 ENCOUNTER — Ambulatory Visit: Payer: Medicare Other | Admitting: Family

## 2014-02-03 DIAGNOSIS — H2510 Age-related nuclear cataract, unspecified eye: Secondary | ICD-10-CM | POA: Diagnosis not present

## 2014-02-04 ENCOUNTER — Ambulatory Visit: Payer: Medicare Other | Admitting: Family

## 2014-02-06 ENCOUNTER — Ambulatory Visit: Payer: Medicare Other | Admitting: Family

## 2014-02-10 ENCOUNTER — Ambulatory Visit (INDEPENDENT_AMBULATORY_CARE_PROVIDER_SITE_OTHER): Payer: Medicare Other | Admitting: Family

## 2014-02-10 ENCOUNTER — Encounter: Payer: Self-pay | Admitting: Family

## 2014-02-10 ENCOUNTER — Other Ambulatory Visit: Payer: Self-pay | Admitting: Family

## 2014-02-10 VITALS — BP 146/78 | HR 82 | Temp 98.3°F | Resp 16 | Ht 60.0 in | Wt 147.1 lb

## 2014-02-10 DIAGNOSIS — Z23 Encounter for immunization: Secondary | ICD-10-CM | POA: Diagnosis not present

## 2014-02-10 DIAGNOSIS — I10 Essential (primary) hypertension: Secondary | ICD-10-CM | POA: Diagnosis not present

## 2014-02-10 DIAGNOSIS — E785 Hyperlipidemia, unspecified: Secondary | ICD-10-CM

## 2014-02-10 DIAGNOSIS — M810 Age-related osteoporosis without current pathological fracture: Secondary | ICD-10-CM

## 2014-02-10 DIAGNOSIS — F17200 Nicotine dependence, unspecified, uncomplicated: Secondary | ICD-10-CM

## 2014-02-10 LAB — HEPATIC FUNCTION PANEL
ALK PHOS: 62 U/L (ref 39–117)
ALT: 11 U/L (ref 0–35)
AST: 12 U/L (ref 0–37)
Albumin: 4.4 g/dL (ref 3.5–5.2)
BILIRUBIN INDIRECT: 0.3 mg/dL (ref 0.2–1.2)
Bilirubin, Direct: 0.1 mg/dL (ref 0.0–0.3)
TOTAL PROTEIN: 6.5 g/dL (ref 6.0–8.3)
Total Bilirubin: 0.4 mg/dL (ref 0.2–1.2)

## 2014-02-10 LAB — LIPID PANEL
Cholesterol: 144 mg/dL (ref 0–200)
HDL: 68 mg/dL (ref >39)
LDL CALC: 60 mg/dL (ref 0–99)
TRIGLYCERIDES: 80 mg/dL (ref <150)
Total CHOL/HDL Ratio: 2.1 Ratio
VLDL: 16 mg/dL (ref 0–40)

## 2014-02-10 MED ORDER — HYDROCHLOROTHIAZIDE 25 MG PO TABS: 25.0000 mg | ORAL_TABLET | Freq: Every day | ORAL | Status: DC

## 2014-02-10 MED ORDER — HYDROCHLOROTHIAZIDE 25 MG PO TABS: ORAL_TABLET | ORAL | Status: DC

## 2014-02-10 NOTE — Telephone Encounter (Signed)
Pt has appt scheduled for 8-15. No more refills until patient is seen

## 2014-02-10 NOTE — Addendum Note (Signed)
Addended by: Kelle Darting A on: 02/10/2014 01:16 PM   Modules accepted: Orders

## 2014-02-10 NOTE — Patient Instructions (Addendum)
Start HCTZ. Complete lab work prior to leaving. Keep working on quitting smoking. Follow up in 2 weeks.

## 2014-02-10 NOTE — Progress Notes (Signed)
Pre visit review using our clinic review tool, if applicable. No additional management support is needed unless otherwise documented below in the visit note. 

## 2014-02-10 NOTE — Progress Notes (Signed)
Subjective:    Patient ID: Judith Flynn, female    DOB: Jul 27, 1940, 73 y.o.   MRN: [id number]  HPI  Judith Flynn is a 73 yr old female who presents today for follow up of multiple medical problems.   1) Hyperlipidemia- She is maintained on atorvastatin. Lab Results  Component Value Date   CHOL 201* 07/30/2013   HDL 66 07/30/2013   LDLCALC 107* 07/30/2013   LDLDIRECT 192.5 06/03/2009   TRIG 140 07/30/2013   CHOLHDL 3.0 07/30/2013   2) HTN-  This is diet controlled.   BP Readings from Last 3 Encounters:  02/10/14 146/78  07/30/13 142/80  06/10/13 138/86   3) Osteoporosis- she is maintained on annual reclast. Was completed last October 31.   4) Tobacco abuse- was given trial of zyban back in November.  Discontinued due to tremors.  Has cut back to 4 cigarettes.   Review of Systems    see HPI  Past Medical History  Diagnosis Date  . Colon polyp   . Hearing loss   . Osteoporosis   . Tobacco abuse   . Cancer 2000    breast- left breast- s/p lumpectomy and radiation    History   Social History  . Marital Status: Widowed    Spouse Name: N/A    Number of Children: 3  . Years of Education: N/A   Occupational History  . retired    Social History Main Topics  . Smoking status: Current Every Day Smoker -- 0.50 packs/day    Types: Cigarettes  . Smokeless tobacco: Not on file     Comment: 4 cigarettes daily  . Alcohol Use: Not on file  . Drug Use: Not on file  . Sexual Activity: Not on file   Other Topics Concern  . Not on file   Social History Narrative   Widow/ widower   Regular exercise- yes   Originally from [place]          Past Surgical History  Procedure Laterality Date  . Breast biopsy  2000  . Surgery on ear drum      right  . Breast lumpectomy  2000    left    Family History  Problem Relation Age of Onset  . Alcohol abuse Mother   . Arthritis Mother   . Hypertension Mother 70  . Heart disease Mother     CHF  . Alcohol abuse Father   .  Arthritis Father   . Hypertension Father 39  . Heart attack Father     Allergies  Allergen Reactions  . Lisinopril     REACTION: cough    Current Outpatient Prescriptions on File Prior to Visit  Medication Sig Dispense Refill  . acetaminophen (TYLENOL) 325 MG tablet Take 650 mg by mouth every 6 (six) hours as needed.        Marland Kitchen atorvastatin (LIPITOR) 40 MG tablet Take 1 tablet (40 mg total) by mouth daily.  90 tablet  1  . BUPROBAN 150 MG 12 hr tablet TAKE 1 TABLET BY MOUTH TWICE DAILY  60 tablet  2  . Calcium Carbonate-Vit D-Min (CALCIUM 1200) 1200-1000 MG-UNIT CHEW Chew 1 tablet by mouth daily.       . Ergocalciferol (VITAMIN D2) 2000 UNITS TABS Take 1 tablet by mouth daily.        Marland Kitchen omeprazole (PRILOSEC) 20 MG capsule Take 1 capsule (20 mg total) by mouth 2 (two) times daily.  180 capsule  1  . zoledronic  acid (RECLAST) 5 MG/100ML SOLN Inject 5 mg into the vein. Annual infusion       No current facility-administered medications on file prior to visit.    BP 146/78  Pulse 82  Temp(Src) 98.3 F (36.8 C) (Oral)  Resp 16  Ht 5' (1.524 m)  Wt 147 lb 1.3 oz (66.715 kg)  BMI 28.72 kg/m2  SpO2 97%    Objective:   Physical Exam  Constitutional: She is oriented to person, place, and time. She appears well-developed and well-nourished. No distress.  HENT:  Head: Normocephalic and atraumatic.  Cardiovascular: Normal rate and regular rhythm.   No murmur heard. Pulmonary/Chest: Effort normal and breath sounds normal. No respiratory distress. She has no wheezes. She has no rales. She exhibits no tenderness.  Neurological: She is alert and oriented to person, place, and time.  Psychiatric: She has a normal mood and affect. Her behavior is normal. Judgment and thought content normal.          Assessment & Plan:

## 2014-02-11 ENCOUNTER — Encounter: Payer: Self-pay | Admitting: Family

## 2014-02-13 NOTE — Assessment & Plan Note (Signed)
Urged complete cessation.  ?

## 2014-02-13 NOTE — Assessment & Plan Note (Signed)
Follow up lipids much improved.  LDL 60.

## 2014-02-13 NOTE — Assessment & Plan Note (Signed)
Blood pressure is slightly above goal. Will initiate hctz.

## 2014-02-13 NOTE — Assessment & Plan Note (Signed)
Will be due for reclast in November.

## 2014-02-17 ENCOUNTER — Other Ambulatory Visit: Payer: Self-pay | Admitting: Family

## 2014-02-17 DIAGNOSIS — Z1231 Encounter for screening mammogram for malignant neoplasm of breast: Secondary | ICD-10-CM

## 2014-02-25 ENCOUNTER — Ambulatory Visit: Payer: Medicare Other | Admitting: Family

## 2014-02-25 DIAGNOSIS — H2510 Age-related nuclear cataract, unspecified eye: Secondary | ICD-10-CM | POA: Diagnosis not present

## 2014-02-27 ENCOUNTER — Encounter: Payer: Self-pay | Admitting: Family

## 2014-02-27 ENCOUNTER — Ambulatory Visit (INDEPENDENT_AMBULATORY_CARE_PROVIDER_SITE_OTHER): Payer: Medicare Other | Admitting: Family

## 2014-02-27 VITALS — BP 130/80 | HR 67 | Temp 98.0°F | Resp 16 | Ht 60.0 in | Wt 149.1 lb

## 2014-02-27 DIAGNOSIS — I10 Essential (primary) hypertension: Secondary | ICD-10-CM | POA: Diagnosis not present

## 2014-02-27 LAB — BASIC METABOLIC PANEL
BUN: 24 mg/dL — AB (ref 6–23)
CALCIUM: 9.4 mg/dL (ref 8.4–10.5)
CHLORIDE: 104 meq/L (ref 96–112)
CO2: 29 meq/L (ref 19–32)
Creat: 0.98 mg/dL (ref 0.50–1.10)
Glucose, Bld: 83 mg/dL (ref 70–99)
Potassium: 4.5 mEq/L (ref 3.5–5.3)
Sodium: 141 mEq/L (ref 135–145)

## 2014-02-27 MED ORDER — HYDROCHLOROTHIAZIDE 25 MG PO TABS: ORAL_TABLET | ORAL | Status: DC

## 2014-02-27 NOTE — Progress Notes (Signed)
Pre visit review using our clinic review tool, if applicable. No additional management support is needed unless otherwise documented below in the visit note. 

## 2014-02-27 NOTE — Assessment & Plan Note (Signed)
Improved, continue hctz, obtain follow up bmet.

## 2014-02-27 NOTE — Progress Notes (Signed)
Subjective:    Patient ID: Judith Flynn, female    DOB: 12-10-1940, 73 y.o.   MRN: [id number]  HPI  Judith Flynn is a 73 yr old female who presents today for follow up of hypertension. Last visit BP was above goal and hctz was added to her regimen.   BP Readings from Last 3 Encounters:  02/27/14 130/80  02/10/14 146/78  07/30/13 142/80     Review of Systems  See HPI  Past Medical History  Diagnosis Date  . Colon polyp   . Hearing loss   . Osteoporosis   . Tobacco abuse   . Cancer 2000    breast- left breast- s/p lumpectomy and radiation    History   Social History  . Marital Status: Widowed    Spouse Name: N/A    Number of Children: 3  . Years of Education: N/A   Occupational History  . retired    Social History Main Topics  . Smoking status: Current Every Day Smoker -- 0.50 packs/day    Types: Cigarettes  . Smokeless tobacco: Not on file     Comment: 4 cigarettes daily  . Alcohol Use: Not on file  . Drug Use: Not on file  . Sexual Activity: Not on file   Other Topics Concern  . Not on file   Social History Narrative   Widow/ widower   Regular exercise- yes   Originally from [place]          Past Surgical History  Procedure Laterality Date  . Breast biopsy  2000  . Surgery on ear drum      right  . Breast lumpectomy  2000    left    Family History  Problem Relation Age of Onset  . Alcohol abuse Mother   . Arthritis Mother   . Hypertension Mother 51  . Heart disease Mother     CHF  . Alcohol abuse Father   . Arthritis Father   . Hypertension Father 67  . Heart attack Father     Allergies  Allergen Reactions  . Lisinopril     REACTION: cough    Current Outpatient Prescriptions on File Prior to Visit  Medication Sig Dispense Refill  . acetaminophen (TYLENOL) 325 MG tablet Take 650 mg by mouth every 6 (six) hours as needed.        Marland Kitchen atorvastatin (LIPITOR) 40 MG tablet Take 1 tablet (40 mg total) by mouth daily.  90 tablet  1    . BUPROBAN 150 MG 12 hr tablet TAKE 1 TABLET BY MOUTH TWICE DAILY  60 tablet  2  . Calcium Carbonate-Vit D-Min (CALCIUM 1200) 1200-1000 MG-UNIT CHEW Chew 1 tablet by mouth daily.       . Ergocalciferol (VITAMIN D2) 2000 UNITS TABS Take 1 tablet by mouth daily.        . Multiple Vitamins-Minerals (PRESERVISION AREDS 2) CAPS Take 1 capsule by mouth daily.      Marland Kitchen omeprazole (PRILOSEC) 20 MG capsule Take 1 capsule (20 mg total) by mouth 2 (two) times daily.  180 capsule  1  . zoledronic acid (RECLAST) 5 MG/100ML SOLN Inject 5 mg into the vein. Annual infusion       No current facility-administered medications on file prior to visit.    BP 130/80  Pulse 67  Temp(Src) 98 F (36.7 C) (Oral)  Resp 16  Ht 5' (1.524 m)  Wt 149 lb 1.4 oz (67.627 kg)  BMI 29.12 kg/m2  SpO2 97%       Objective:   Physical Exam  Constitutional: She is oriented to person, place, and time. She appears well-developed and well-nourished. No distress.  HENT:  Head: Normocephalic and atraumatic.  Cardiovascular: Normal rate and regular rhythm.   No murmur heard. Pulmonary/Chest: Effort normal and breath sounds normal. No respiratory distress. She has no wheezes. She has no rales. She exhibits no tenderness.  Musculoskeletal: She exhibits no edema.  Neurological: She is alert and oriented to person, place, and time.  Psychiatric: She has a normal mood and affect. Her behavior is normal. Judgment and thought content normal.          Assessment & Plan:

## 2014-02-27 NOTE — Patient Instructions (Signed)
Please complete lab work prior to leaving. Follow up in 3 months.  

## 2014-02-28 ENCOUNTER — Encounter: Payer: Self-pay | Admitting: Family

## 2014-03-09 ENCOUNTER — Other Ambulatory Visit: Payer: Self-pay | Admitting: *Deleted

## 2014-03-09 DIAGNOSIS — H2510 Age-related nuclear cataract, unspecified eye: Secondary | ICD-10-CM | POA: Diagnosis not present

## 2014-03-09 DIAGNOSIS — H2589 Other age-related cataract: Secondary | ICD-10-CM | POA: Diagnosis not present

## 2014-03-09 MED ORDER — ATORVASTATIN CALCIUM 40 MG PO TABS: 40.0000 mg | ORAL_TABLET | Freq: Every day | ORAL | Status: DC

## 2014-03-09 NOTE — Progress Notes (Signed)
Rx request to mail order pharmacy/[facility]  

## 2014-03-11 ENCOUNTER — Other Ambulatory Visit: Payer: Self-pay

## 2014-03-11 MED ORDER — ATORVASTATIN CALCIUM 40 MG PO TABS: 40.0000 mg | ORAL_TABLET | Freq: Every day | ORAL | Status: DC

## 2014-03-13 ENCOUNTER — Ambulatory Visit (HOSPITAL_BASED_OUTPATIENT_CLINIC_OR_DEPARTMENT_OTHER)
Admission: RE | Admit: 2014-03-13 | Discharge: 2014-03-13 | Disposition: A | Discharge status: Home / Self Care | Payer: Medicare Other | Source: Ambulatory Visit | Attending: Family | Admitting: Family

## 2014-03-13 DIAGNOSIS — Z1231 Encounter for screening mammogram for malignant neoplasm of breast: Secondary | ICD-10-CM | POA: Insufficient documentation

## 2014-03-13 IMAGING — MG MM DIGITAL SCREENING
4 series · 4 of 4 positions shown · non-contrast
Comparison: Previous exam(s).

CLINICAL DATA: Screening.

EXAM:
DIGITAL SCREENING BILATERAL MAMMOGRAM WITH CAD

[R CC]
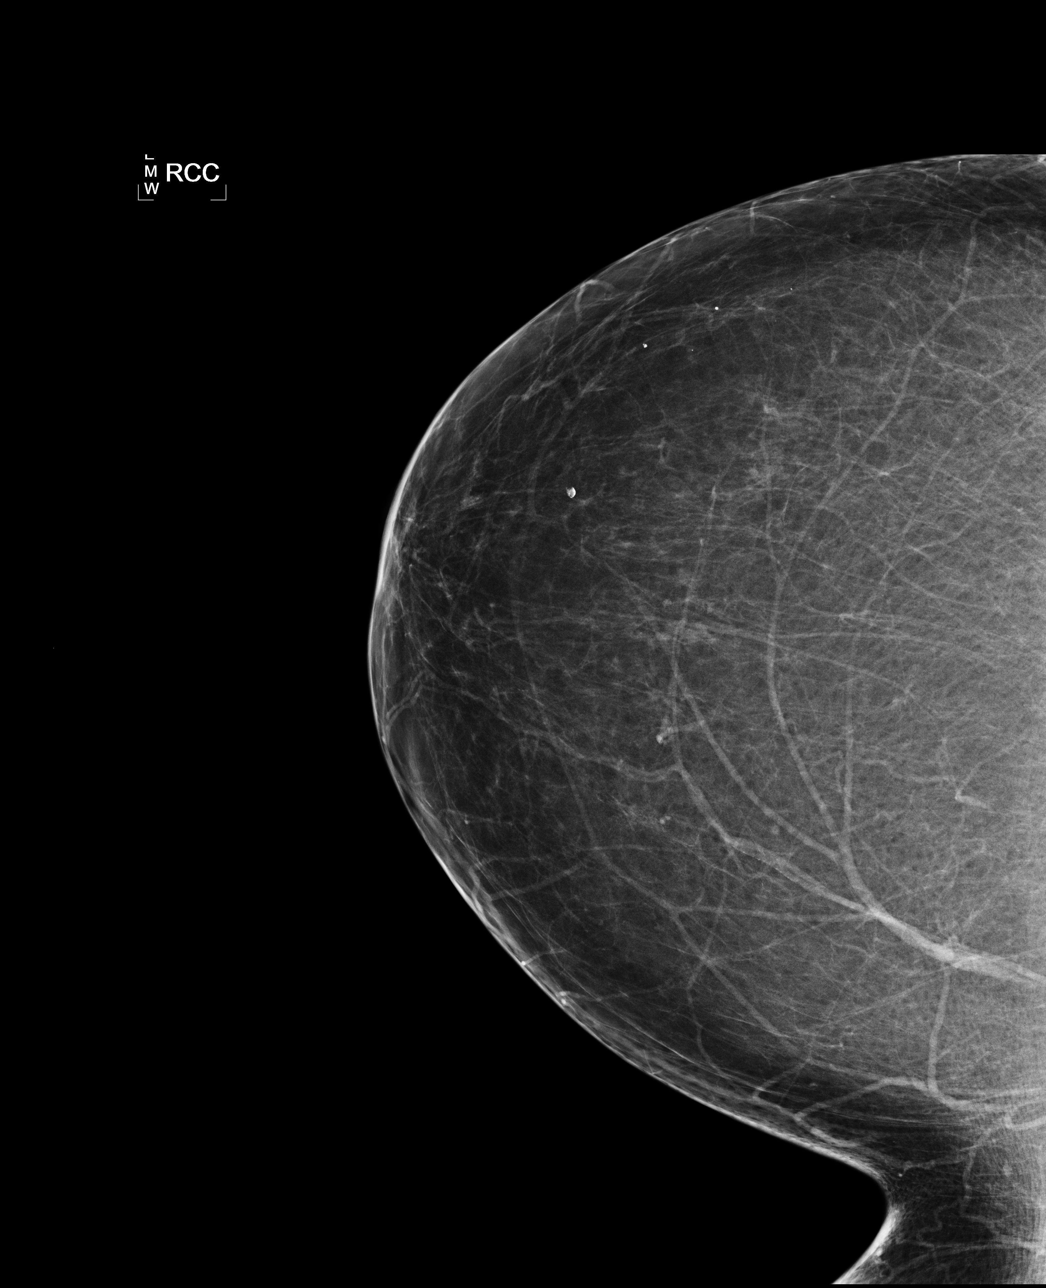

[L CC]
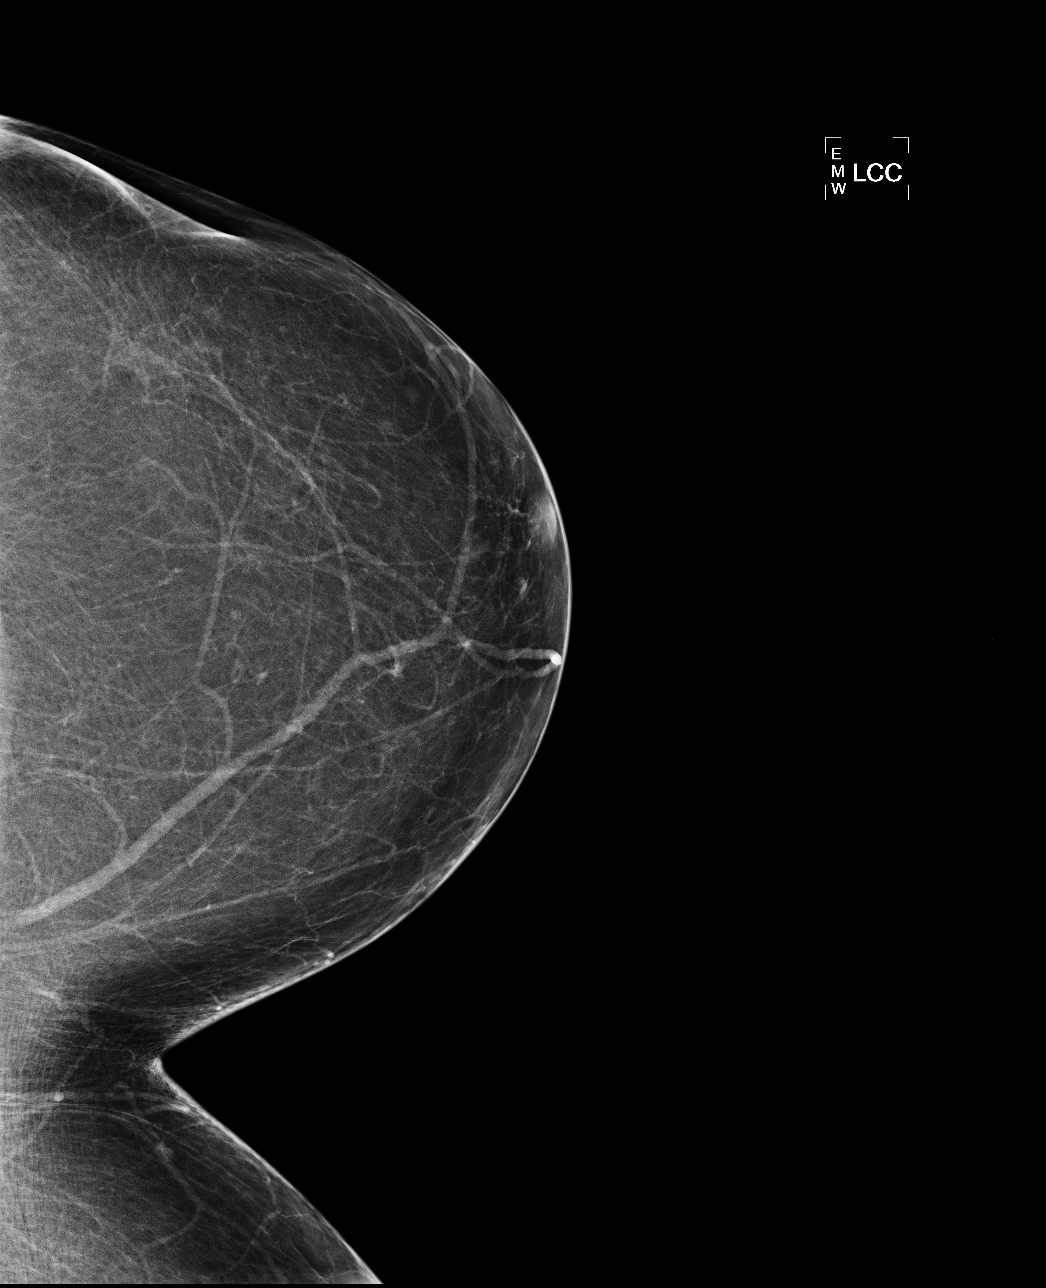

[L MLO]
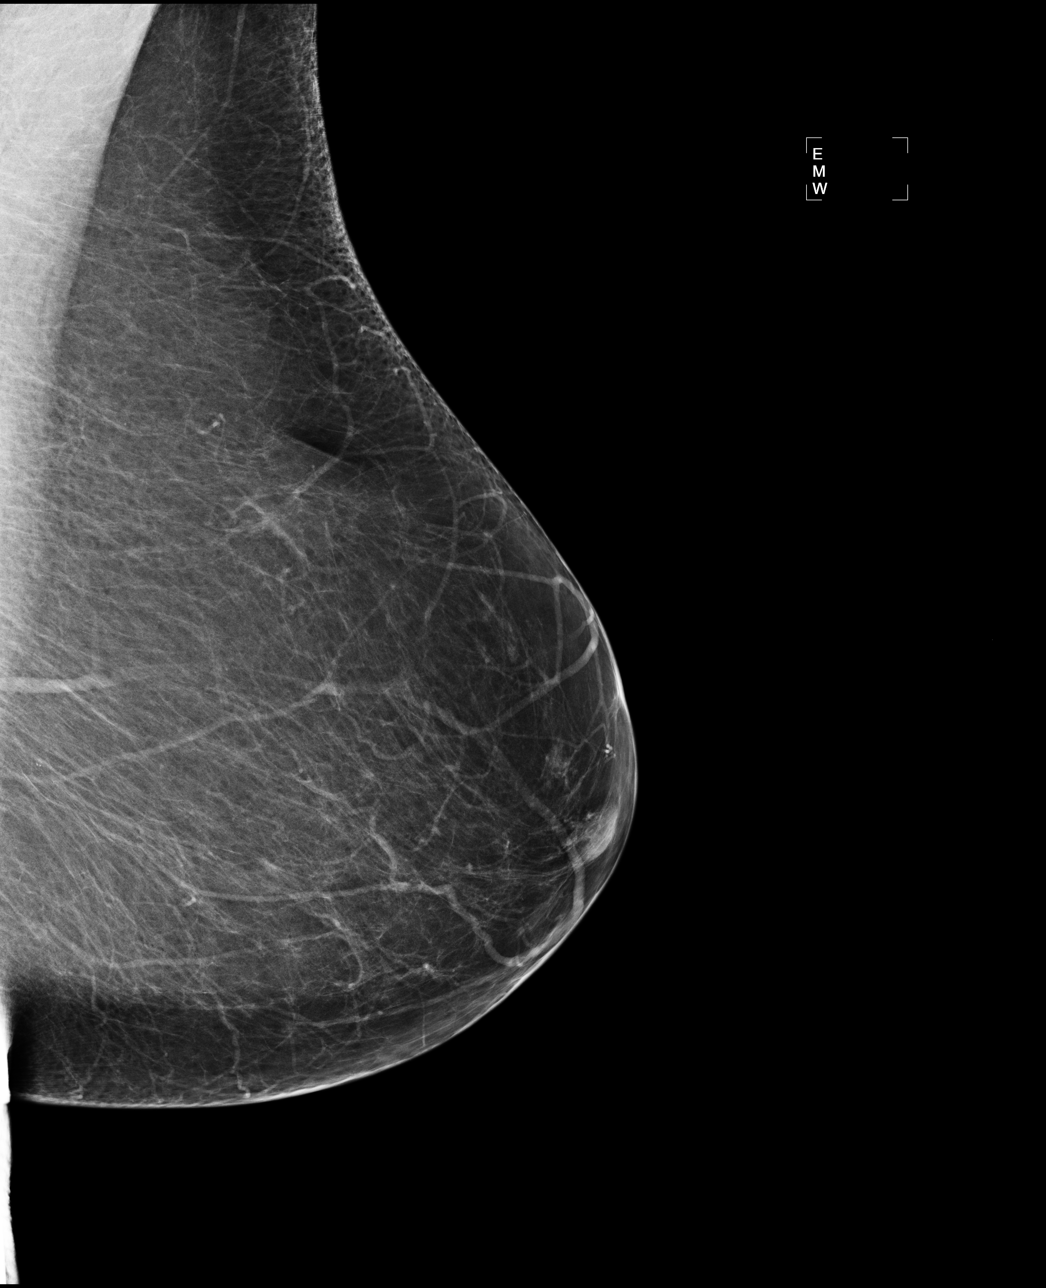

[R MLO]
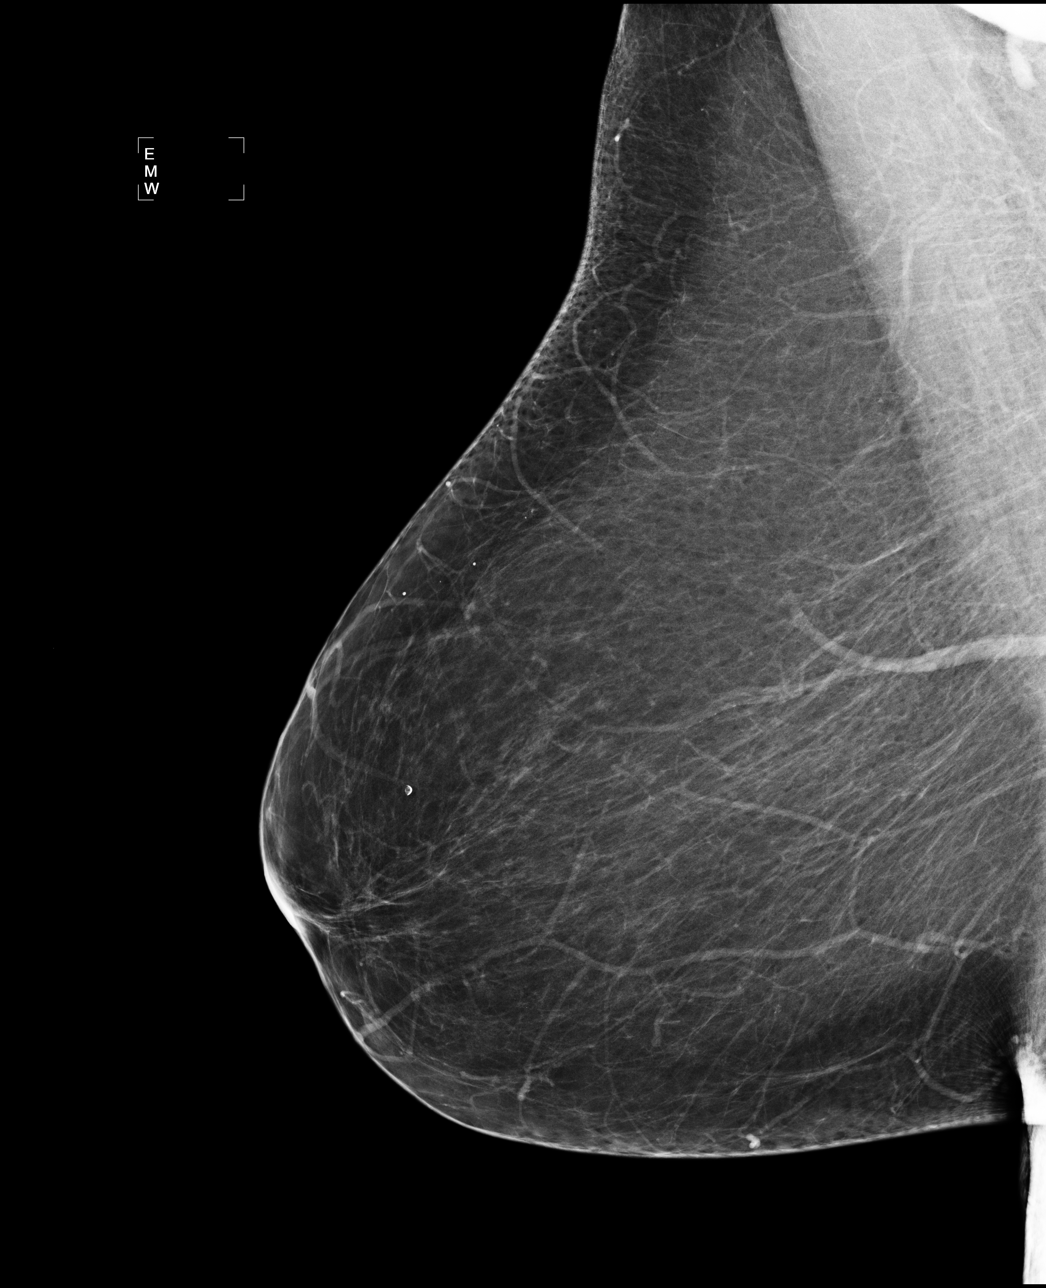

[4 of 4 positions shown; findings below may reference images not displayed]

ACR Breast Density Category b: There are scattered areas of
fibroglandular density.
FINDINGS: There are no findings suspicious for malignancy. Images were
processed with CAD.
IMPRESSION: No mammographic evidence of malignancy. A result letter of this
screening mammogram will be mailed directly to the patient.

RECOMMENDATION:
Screening mammogram in one year. (Code:AS-G-LCT)

BI-RADS CATEGORY  1: Negative.

## 2014-03-17 DIAGNOSIS — H2510 Age-related nuclear cataract, unspecified eye: Secondary | ICD-10-CM | POA: Diagnosis not present

## 2014-03-23 DIAGNOSIS — H2510 Age-related nuclear cataract, unspecified eye: Secondary | ICD-10-CM | POA: Diagnosis not present

## 2014-03-23 DIAGNOSIS — H2589 Other age-related cataract: Secondary | ICD-10-CM | POA: Diagnosis not present

## 2014-03-26 ENCOUNTER — Other Ambulatory Visit: Payer: Self-pay | Admitting: Family

## 2014-05-22 ENCOUNTER — Ambulatory Visit (HOSPITAL_BASED_OUTPATIENT_CLINIC_OR_DEPARTMENT_OTHER): Payer: Medicare Other | Admitting: Hematology & Oncology

## 2014-05-22 ENCOUNTER — Encounter: Payer: Self-pay | Admitting: Hematology & Oncology

## 2014-05-22 ENCOUNTER — Other Ambulatory Visit: Payer: Self-pay | Admitting: Family

## 2014-05-22 ENCOUNTER — Other Ambulatory Visit (HOSPITAL_BASED_OUTPATIENT_CLINIC_OR_DEPARTMENT_OTHER): Payer: Medicare Other | Admitting: Lab

## 2014-05-22 ENCOUNTER — Ambulatory Visit (HOSPITAL_BASED_OUTPATIENT_CLINIC_OR_DEPARTMENT_OTHER): Payer: Medicare Other

## 2014-05-22 VITALS — BP 126/61 | HR 73 | Temp 98.0°F | Resp 14 | Ht 60.0 in | Wt 151.0 lb

## 2014-05-22 DIAGNOSIS — M810 Age-related osteoporosis without current pathological fracture: Secondary | ICD-10-CM

## 2014-05-22 DIAGNOSIS — Z853 Personal history of malignant neoplasm of breast: Secondary | ICD-10-CM

## 2014-05-22 DIAGNOSIS — Z23 Encounter for immunization: Secondary | ICD-10-CM | POA: Diagnosis not present

## 2014-05-22 DIAGNOSIS — D0512 Intraductal carcinoma in situ of left breast: Secondary | ICD-10-CM | POA: Diagnosis not present

## 2014-05-22 DIAGNOSIS — M818 Other osteoporosis without current pathological fracture: Secondary | ICD-10-CM

## 2014-05-22 DIAGNOSIS — T386X5A Adverse effect of antigonadotrophins, antiestrogens, antiandrogens, not elsewhere classified, initial encounter: Secondary | ICD-10-CM

## 2014-05-22 LAB — COMPREHENSIVE METABOLIC PANEL
ALBUMIN: 4.1 g/dL (ref 3.5–5.2)
ALT: 10 U/L (ref 0–35)
AST: 11 U/L (ref 0–37)
Alkaline Phosphatase: 70 U/L (ref 39–117)
BUN: 16 mg/dL (ref 6–23)
CALCIUM: 9.4 mg/dL (ref 8.4–10.5)
CHLORIDE: 103 meq/L (ref 96–112)
CO2: 28 mEq/L (ref 19–32)
Creatinine, Ser: 0.92 mg/dL (ref 0.50–1.10)
Glucose, Bld: 77 mg/dL (ref 70–99)
POTASSIUM: 3.7 meq/L (ref 3.5–5.3)
Sodium: 141 mEq/L (ref 135–145)
Total Bilirubin: 0.4 mg/dL (ref 0.2–1.2)
Total Protein: 6.1 g/dL (ref 6.0–8.3)

## 2014-05-22 LAB — CBC WITH DIFFERENTIAL (CANCER CENTER ONLY)
BASO#: 0.1 10*3/uL (ref 0.0–0.2)
BASO%: 1.3 % (ref 0.0–2.0)
EOS%: 1.3 % (ref 0.0–7.0)
Eosinophils Absolute: 0.1 10*3/uL (ref 0.0–0.5)
HCT: 39.2 % (ref 34.8–46.6)
HEMOGLOBIN: 13.5 g/dL (ref 11.6–15.9)
LYMPH#: 1.7 10*3/uL (ref 0.9–3.3)
LYMPH%: 24.7 % (ref 14.0–48.0)
MCH: 32.5 pg (ref 26.0–34.0)
MCHC: 34.4 g/dL (ref 32.0–36.0)
MCV: 94 fL (ref 81–101)
MONO#: 0.6 10*3/uL (ref 0.1–0.9)
MONO%: 7.8 % (ref 0.0–13.0)
NEUT%: 64.9 % (ref 39.6–80.0)
NEUTROS ABS: 4.6 10*3/uL (ref 1.5–6.5)
PLATELETS: 184 10*3/uL (ref 145–400)
RBC: 4.16 10*6/uL (ref 3.70–5.32)
RDW: 12.3 % (ref 11.1–15.7)
WBC: 7 10*3/uL (ref 3.9–10.0)

## 2014-05-22 MED ORDER — INFLUENZA VAC SPLIT QUAD 0.5 ML IM SUSY
0.5000 mL | PREFILLED_SYRINGE | Freq: Once | INTRAMUSCULAR | Status: AC
  Administered 2014-05-22: 0.5 mL via INTRAMUSCULAR
  Filled 2014-05-22: qty 0.5

## 2014-05-22 MED ORDER — ZOLEDRONIC ACID 4 MG/100ML IV SOLN
4.0000 mg | Freq: Once | INTRAVENOUS | Status: AC
  Administered 2014-05-22: 4 mg via INTRAVENOUS
  Filled 2014-05-22: qty 100

## 2014-05-22 NOTE — Patient Instructions (Signed)

## 2014-05-23 NOTE — Progress Notes (Signed)
Hematology and Oncology Follow Up Visit  Judith Flynn [id number] [birth year] 73 y.o. 05/23/2014   Principle Diagnosis:  1. Ductal carcinoma in situ of the left breast. 2. Osteoporosis.  Current Therapy:    Reclast 5mg  IV q year     Interim History:  Judith Flynn is back for her yearly follow-up. We see her in October. Patient is doing pretty well. She has been traveling. Her sisters come in from out of town and they go up to [place]. I think she has a brother there was not doing too well.  She is doing okay. She's not had any problems with pain. She's had no nausea or vomiting. There's been no change in bowel or bladder habits. She's had no rashes. There's been no leg swelling.  She takes her vitamin D. Today she also takes baby aspirin.  Her last mammogram was done in August. It looked okay.  Medications: Current outpatient prescriptions:acetaminophen (TYLENOL) 325 MG tablet, Take 650 mg by mouth every 6 (six) hours as needed.  , Disp: , Rfl: ;  atorvastatin (LIPITOR) 40 MG tablet, Take 1 tablet (40 mg total) by mouth daily., Disp: 90 tablet, Rfl: 1;  BUPROBAN 150 MG 12 hr tablet, TAKE 1 TABLET BY MOUTH TWICE DAILY, Disp: 60 tablet, Rfl: 2 Calcium Carbonate-Vit D-Min (CALCIUM 1200) 1200-1000 MG-UNIT CHEW, Chew 1 tablet by mouth daily. , Disp: , Rfl: ;  Ergocalciferol (VITAMIN D2) 2000 UNITS TABS, Take 1 tablet by mouth daily.  , Disp: , Rfl: ;  hydrochlorothiazide (HYDRODIURIL) 25 MG tablet, TAKE 1 TABLET BY MOUTH DAILY, Disp: 90 tablet, Rfl: 1;  Multiple Vitamins-Minerals (PRESERVISION AREDS 2) CAPS, Take 1 capsule by mouth daily., Disp: , Rfl:  omeprazole (PRILOSEC) 20 MG capsule, TAKE 1 CAPSULE TWICE DAILY, Disp: 180 capsule, Rfl: 1;  prednisoLONE acetate (PRED FORTE) 1 % ophthalmic suspension, , Disp: , Rfl: ;  zoledronic acid (RECLAST) 5 MG/100ML SOLN, Inject 5 mg into the vein. Annual infusion, Disp: , Rfl:   Allergies:  Allergies  Allergen Reactions  . Lisinopril    REACTION: cough    Past Medical History, Surgical history, Social history, and Family History were reviewed and updated.  Review of Systems: As above  Physical Exam:  height is 5' (1.524 m) and weight is 151 lb (68.493 kg). Her oral temperature is 98 F (36.7 C). Her blood pressure is 126/61 and her pulse is 73. Her respiration is 14.   Elderly but well-nourished white female in no obvious distress. Head and neck exam shows no ocular or oral lesions. She has no palpable cervical or supraclavicular lymph nodes. Lungs are clear. Cardiac exam regular rate and rhythm with no murmurs, rubs or bruits. Breast exam shows right breast with no masses, edema or erythema. There is no right axillary adenopathy. Left breast shows well-healed lumpectomy at the 2:00 position. There is some slight radiation changes. No masses noted in the left breast. There is no left axillary adenopathy. Abdomen is soft and she has good bowel sounds. There is no fluid wave. There is no palpable liver or spleen tip. Extremities shows no clubbing, cyanosis or edema.     Lab Results  Component Value Date   WBC 7.0 05/22/2014   HGB 13.5 05/22/2014   HCT 39.2 05/22/2014   MCV 94 05/22/2014   PLT 184 05/22/2014     Chemistry      Component Value Date/Time   NA 141 05/22/2014 0843   NA 142 05/23/2012 1130   K  3.7 05/22/2014 0843   K 3.8 05/23/2012 1130   CL 103 05/22/2014 0843   CL 104 05/23/2012 1130   CO2 28 05/22/2014 0843   CO2 28 05/23/2012 1130   BUN 16 05/22/2014 0843   BUN 15 05/23/2012 1130   CREATININE 0.92 05/22/2014 0843   CREATININE 0.98 02/27/2014 1009      Component Value Date/Time   CALCIUM 9.4 05/22/2014 0843   CALCIUM 8.9 05/23/2012 1130   ALKPHOS 70 05/22/2014 0843   ALKPHOS 49 05/23/2012 1130   AST 11 05/22/2014 0843   AST 18 05/23/2012 1130   ALT 10 05/22/2014 0843   ALT 17 05/23/2012 1130   BILITOT 0.4 05/22/2014 0843   BILITOT 0.60 05/23/2012 1130         Impression and  Plan: Judith Flynn is  73 year old white female with ductal carcinoma site of the left breast area and she is doing okay. She was diagnosed 15 years ago. She was on tamoxifen for 5 years and completed this in 2006.  She has some osteoporosis. The Reclast helps this.  We will plan to get her back to see [place] in another year.  She got her flu shot today.     Volanda Napoleon, MD 10/31/20159:25 AM

## 2014-05-25 ENCOUNTER — Ambulatory Visit: Payer: Medicare Other | Admitting: Hematology & Oncology

## 2014-05-25 ENCOUNTER — Ambulatory Visit: Payer: Medicare Other

## 2014-05-25 ENCOUNTER — Other Ambulatory Visit: Payer: Medicare Other | Admitting: Lab

## 2014-06-02 ENCOUNTER — Ambulatory Visit: Payer: Medicare Other | Admitting: Family

## 2014-06-03 ENCOUNTER — Ambulatory Visit: Payer: Medicare Other | Admitting: Family

## 2014-06-04 ENCOUNTER — Ambulatory Visit: Payer: Medicare Other | Admitting: Family

## 2014-06-29 ENCOUNTER — Ambulatory Visit: Payer: Medicare Other | Admitting: Family

## 2014-08-05 ENCOUNTER — Encounter: Payer: Self-pay | Admitting: Family

## 2014-08-05 ENCOUNTER — Ambulatory Visit (INDEPENDENT_AMBULATORY_CARE_PROVIDER_SITE_OTHER): Payer: Medicare Other | Admitting: Family

## 2014-08-05 VITALS — BP 136/80 | HR 77 | Temp 98.2°F | Resp 18 | Ht 60.0 in | Wt 151.4 lb

## 2014-08-05 DIAGNOSIS — M810 Age-related osteoporosis without current pathological fracture: Secondary | ICD-10-CM

## 2014-08-05 DIAGNOSIS — K219 Gastro-esophageal reflux disease without esophagitis: Secondary | ICD-10-CM | POA: Diagnosis not present

## 2014-08-05 DIAGNOSIS — I10 Essential (primary) hypertension: Secondary | ICD-10-CM

## 2014-08-05 DIAGNOSIS — Z720 Tobacco use: Secondary | ICD-10-CM

## 2014-08-05 LAB — BASIC METABOLIC PANEL WITH GFR
BUN: 17 mg/dL (ref 6–23)
CO2: 31 meq/L (ref 19–32)
Calcium: 9.6 mg/dL (ref 8.4–10.5)
Chloride: 103 meq/L (ref 96–112)
Creatinine, Ser: 0.88 mg/dL (ref 0.40–1.20)
GFR: 66.57 mL/min
Glucose, Bld: 105 mg/dL — ABNORMAL HIGH (ref 70–99)
Potassium: 3.7 meq/L (ref 3.5–5.1)
Sodium: 140 meq/L (ref 135–145)

## 2014-08-05 NOTE — Patient Instructions (Addendum)
You will be contacted about your bone density and CT scan. Follow up in 6 months.

## 2014-08-05 NOTE — Assessment & Plan Note (Signed)
No reflux or heartburn. Continue omeprazole.

## 2014-08-05 NOTE — Progress Notes (Signed)
Pre visit review using our clinic review tool, if applicable. No additional management support is needed unless otherwise documented below in the visit note. 

## 2014-08-05 NOTE — Assessment & Plan Note (Addendum)
She reports that she is trying to quit. Smoking 10 cigarettes per day. Octain chest CT for lung cancer screening.

## 2014-08-05 NOTE — Assessment & Plan Note (Addendum)
BP slightly elevated on HCTZ. She has begun walking 30 minutes daily recently. Will continue same dose of HCTZ and see if TLC improves BP.

## 2014-08-05 NOTE — Progress Notes (Signed)
Subjective:    Patient ID: Judith Flynn, female    DOB: 09/12/1940, 74 y.o.   MRN: [id number]  HPI Judith Flynn is here today for follow up.  1. HYPERTENSION: Reports compliance with hctz. The patient denies the following associated symptoms: chest pain, dyspnea, blurred vision, headache, or lower extremity edema. Smoking: smoking 10 cigarettes. Not taking buproban-caused hand tremor. She reports she is trying to quit. BP Readings from Last 3 Encounters:  08/05/14 136/80  05/22/14 126/61  02/27/14 130/80   2. GERD: takes omeprazole. No heartburn or reflux.  3. Hyperlipidemia: Reports compliance with lipitor. Denies myalgia. Walks daily for 30 minutes. Lab Results  Component Value Date   CHOL 144 02/10/2014   HDL 68 02/10/2014   LDLCALC 60 02/10/2014   LDLDIRECT 192.5 06/03/2009   TRIG 80 02/10/2014   CHOLHDL 2.1 02/10/2014   4. Complains of left hip pain (3/10) that started two weeks ago.She thinks it may be related to stepping up into her Lucianne Lei.  It has improved. Does not radiate down leg. Would like to have a repeat bone density scan.   Review of Systems  Gastrointestinal: Negative for abdominal pain, diarrhea, constipation and blood in stool.  Musculoskeletal: Negative for back pain, joint swelling and gait problem.       ROM equal in right and left hips.  Psychiatric/Behavioral:       Denies depression/anxiety.   Past Medical History  Diagnosis Date  . Colon polyp   . Hearing loss   . Osteoporosis   . Tobacco abuse   . Cancer 2000    breast- left breast- s/p lumpectomy and radiation    History   Social History  . Marital Status: Widowed    Spouse Name: N/A    Number of Children: 3  . Years of Education: N/A   Occupational History  . retired    Social History Main Topics  . Smoking status: Current Every Day Smoker -- 0.50 packs/day    Types: Cigarettes  . Smokeless tobacco: Never Used     Comment: 05-22-14  smokes daily  . Alcohol Use: Not on  file  . Drug Use: Not on file  . Sexual Activity: Not on file   Other Topics Concern  . Not on file   Social History Narrative   Widow/ widower   Regular exercise- yes   Originally from [place]          Past Surgical History  Procedure Laterality Date  . Breast biopsy  2000  . Surgery on ear drum      right  . Breast lumpectomy  2000    left  . Cataract extraction, bilateral Bilateral July and August 2015    Family History  Problem Relation Age of Onset  . Alcohol abuse Mother   . Arthritis Mother   . Hypertension Mother 30  . Heart disease Mother     CHF  . Alcohol abuse Father   . Arthritis Father   . Hypertension Father 29  . Heart attack Father     Allergies  Allergen Reactions  . Lisinopril     REACTION: cough    Current Outpatient Prescriptions on File Prior to Visit  Medication Sig Dispense Refill  . acetaminophen (TYLENOL) 325 MG tablet Take 650 mg by mouth every 6 (six) hours as needed.      Marland Kitchen atorvastatin (LIPITOR) 40 MG tablet Take 1 tablet (40 mg total) by mouth daily. 90 tablet 1  . BUPROBAN 150  MG 12 hr tablet TAKE 1 TABLET BY MOUTH TWICE DAILY 60 tablet 2  . Calcium Carbonate-Vit D-Min (CALCIUM 1200) 1200-1000 MG-UNIT CHEW Chew 1 tablet by mouth daily.     . Ergocalciferol (VITAMIN D2) 2000 UNITS TABS Take 1 tablet by mouth daily.      . hydrochlorothiazide (HYDRODIURIL) 25 MG tablet TAKE 1 TABLET BY MOUTH DAILY 90 tablet 1  . Multiple Vitamins-Minerals (PRESERVISION AREDS 2) CAPS Take 1 capsule by mouth daily.    Marland Kitchen omeprazole (PRILOSEC) 20 MG capsule TAKE 1 CAPSULE TWICE DAILY 180 capsule 1  . zoledronic acid (RECLAST) 5 MG/100ML SOLN Inject 5 mg into the vein. Annual infusion     No current facility-administered medications on file prior to visit.    BP 136/80 mmHg  Pulse 77  Temp(Src) 98.2 F (36.8 C) (Oral)  Resp 18  Ht 5' (1.524 m)  Wt 151 lb 6.4 oz (68.675 kg)  BMI 29.57 kg/m2  SpO2 95%       Objective:   Physical Exam    Constitutional: She is oriented to person, place, and time. She appears well-developed and well-nourished. No distress.  HENT:  Head: Normocephalic and atraumatic.  Mouth/Throat: No oropharyngeal exudate, posterior oropharyngeal edema or posterior oropharyngeal erythema.  Cardiovascular: Normal rate and regular rhythm.  Exam reveals no friction rub.   No murmur heard. Pulmonary/Chest: Effort normal and breath sounds normal. No respiratory distress. She has no wheezes. She has no rales.  Musculoskeletal: Normal range of motion. She exhibits no edema.  Lymphadenopathy:    She has no cervical adenopathy.  Neurological: She is alert and oriented to person, place, and time.  Skin: Skin is warm and dry. She is not diaphoretic.  Psychiatric: She has a normal mood and affect. Her behavior is normal. Judgment and thought content normal.          Assessment & Plan:  Patient seen along with [facility] NP-student.  I have personally seen and examined patient and agree with Judith Flynn's assessment and plan- Judith Alar NP

## 2014-08-05 NOTE — Assessment & Plan Note (Addendum)
Complains of some left hip pain- most likley due to OA. She is past due for bone density and wishes to repeat. Repeat BMD.

## 2014-08-05 NOTE — Assessment & Plan Note (Signed)
LDL at goal on atorvastatin. Continue same. Recheck lipid profile at next visit.

## 2014-08-07 ENCOUNTER — Other Ambulatory Visit (HOSPITAL_BASED_OUTPATIENT_CLINIC_OR_DEPARTMENT_OTHER): Payer: Medicare Other

## 2014-08-07 ENCOUNTER — Ambulatory Visit (HOSPITAL_BASED_OUTPATIENT_CLINIC_OR_DEPARTMENT_OTHER)
Admission: RE | Admit: 2014-08-07 | Discharge: 2014-08-07 | Disposition: A | Discharge status: Home / Self Care | Payer: Medicare Other | Source: Ambulatory Visit | Attending: Family | Admitting: Family

## 2014-08-07 ENCOUNTER — Telehealth: Payer: Self-pay | Admitting: Family

## 2014-08-07 DIAGNOSIS — Z122 Encounter for screening for malignant neoplasm of respiratory organs: Secondary | ICD-10-CM | POA: Diagnosis not present

## 2014-08-07 DIAGNOSIS — J9809 Other diseases of bronchus, not elsewhere classified: Secondary | ICD-10-CM | POA: Diagnosis not present

## 2014-08-07 DIAGNOSIS — Z720 Tobacco use: Secondary | ICD-10-CM

## 2014-08-07 DIAGNOSIS — F17210 Nicotine dependence, cigarettes, uncomplicated: Secondary | ICD-10-CM | POA: Insufficient documentation

## 2014-08-07 DIAGNOSIS — J432 Centrilobular emphysema: Secondary | ICD-10-CM | POA: Diagnosis not present

## 2014-08-07 DIAGNOSIS — I2510 Atherosclerotic heart disease of native coronary artery without angina pectoris: Secondary | ICD-10-CM | POA: Diagnosis not present

## 2014-08-07 IMAGING — CT CT CHEST LUNG CANCER SCREENING LOW DOSE W/O CM
2 of 3 series · 15 of 36 positions shown, 18 images · non-contrast
Comparison: No priors.

CLINICAL DATA: 73-year-old female current smoker with approximately
50 pack-year history of smoking. Lung cancer screening examination.

EXAM:
CT CHEST WITHOUT CONTRAST
TECHNIQUE: Multidetector CT imaging of the chest was performed following the
standard protocol without IV contrast..

[Series 2: chest 5.0 b31f · axial · 0.63mm/px · z∈[-196,+38]mm · 12 of 57 slices shown, 15 images]
[im 5/57  mediastinal]
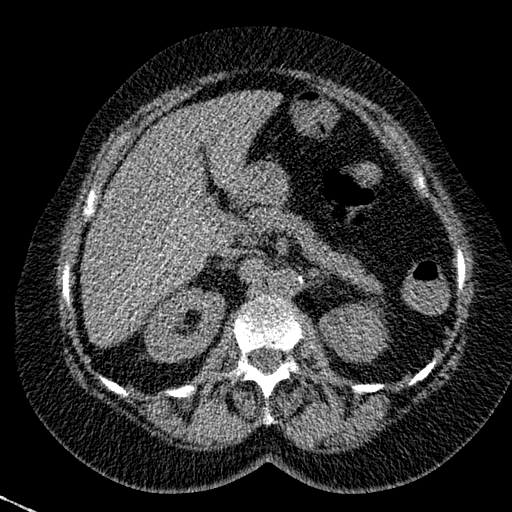
[im 5/57  lung]
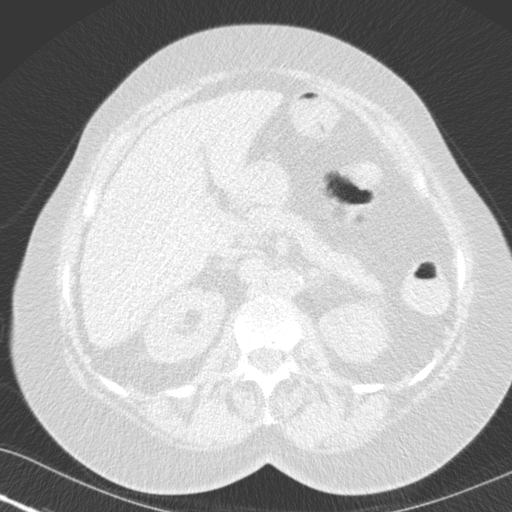
[im 9/57  lung]
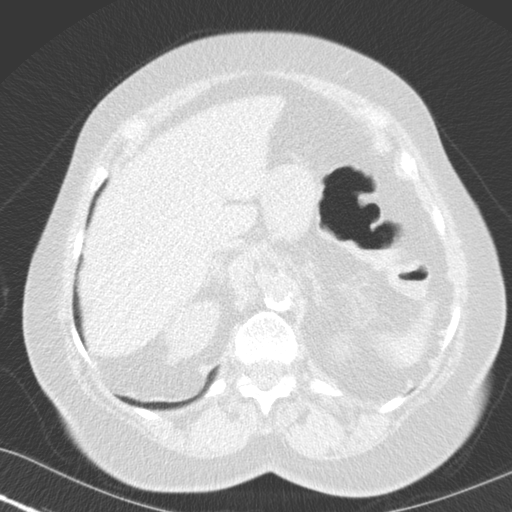
[im 13/57  lung]
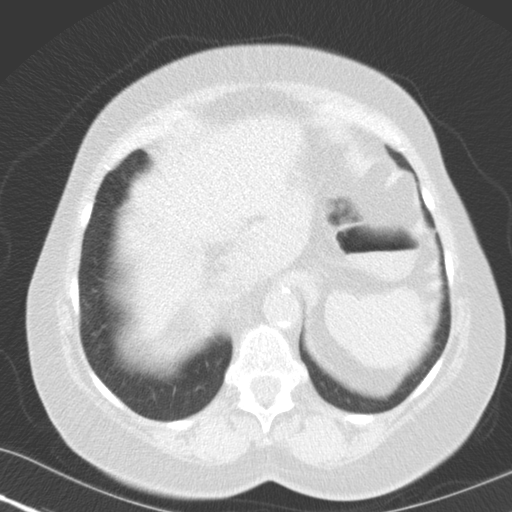
[im 17/57  lung]
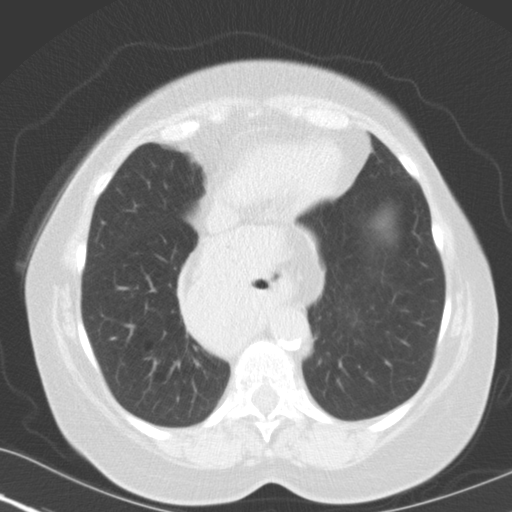
[im 21/57  mediastinal]
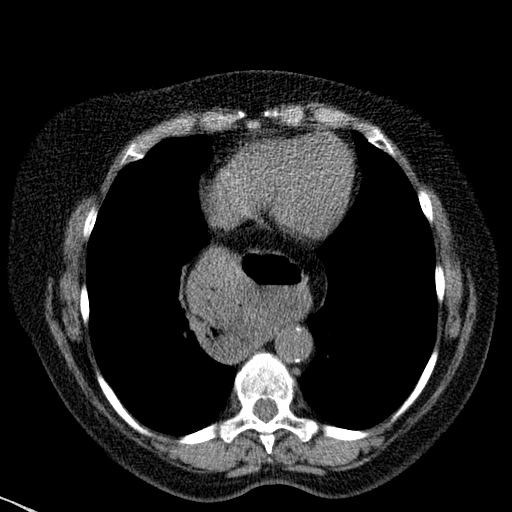
[im 21/57  lung]
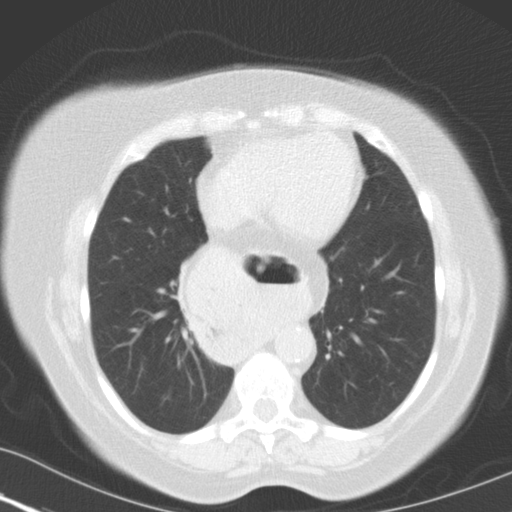
[im 25/57  lung]
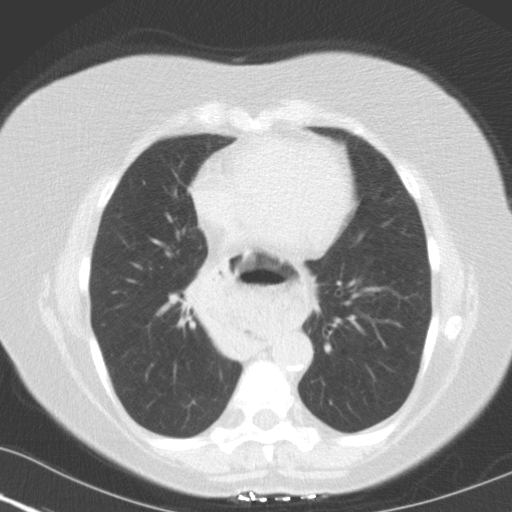
[im 32/57  lung]
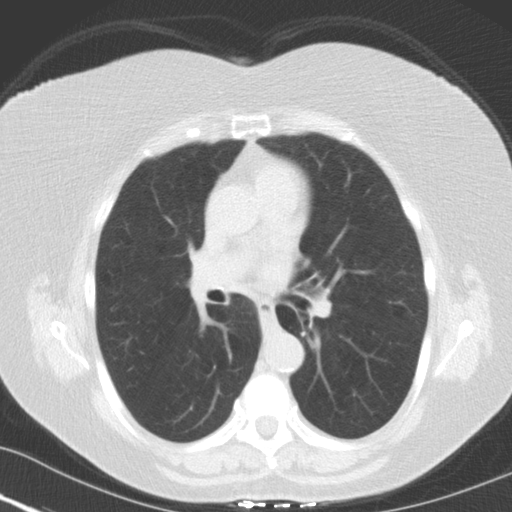
[im 36/57  lung]
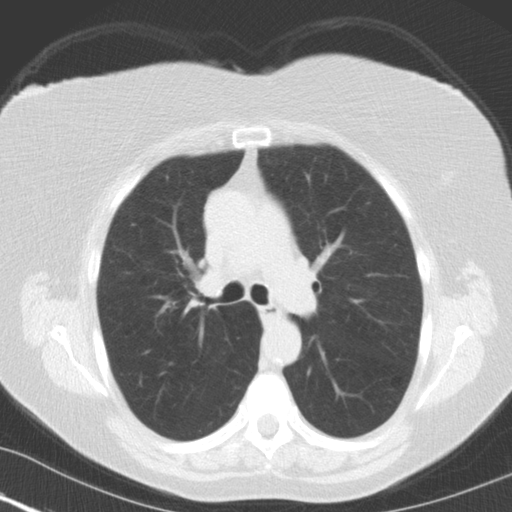
[im 40/57  mediastinal]
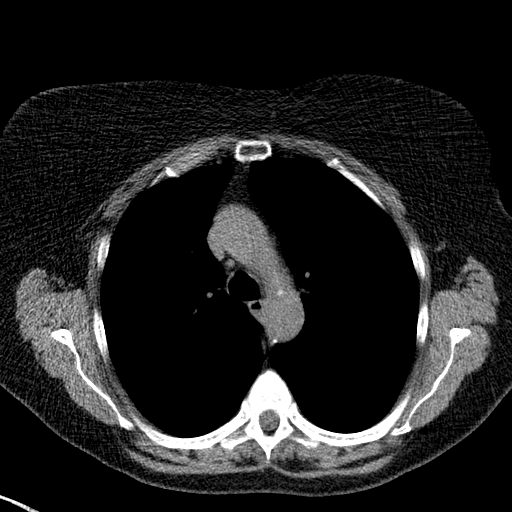
[im 40/57  lung]
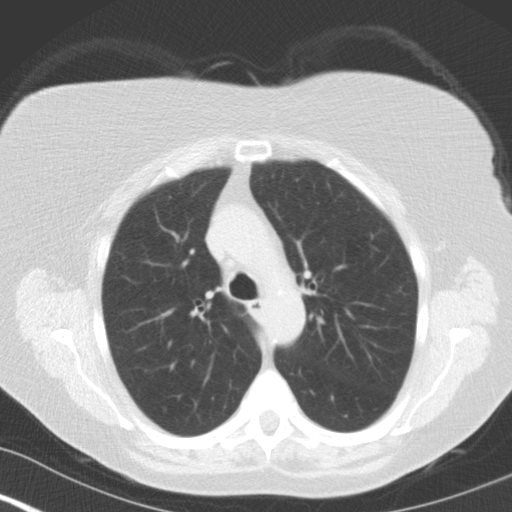
[im 44/57  lung]
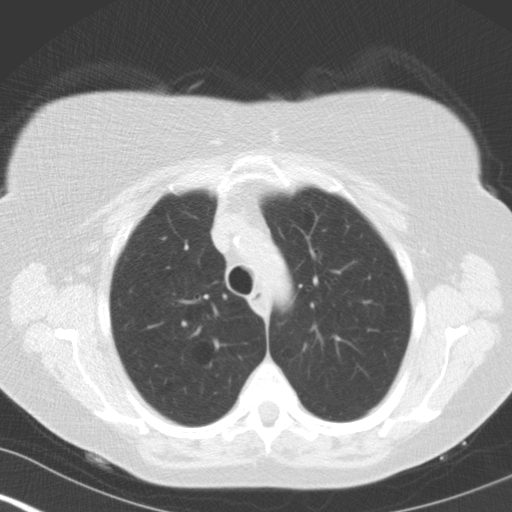
[im 48/57  lung]
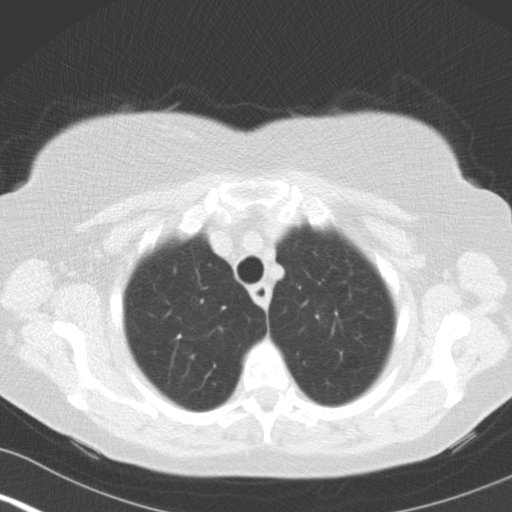
[im 52/57  lung]
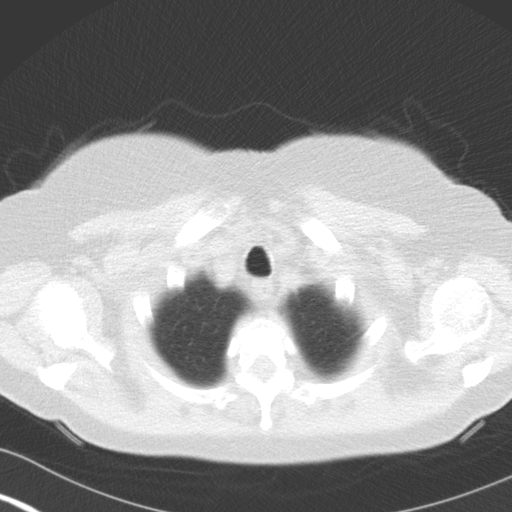

[Series 8: chest 3.0 coronal · coronal · 0.59mm/px · 3 of 87 slices shown]
[im 18/87  lung]
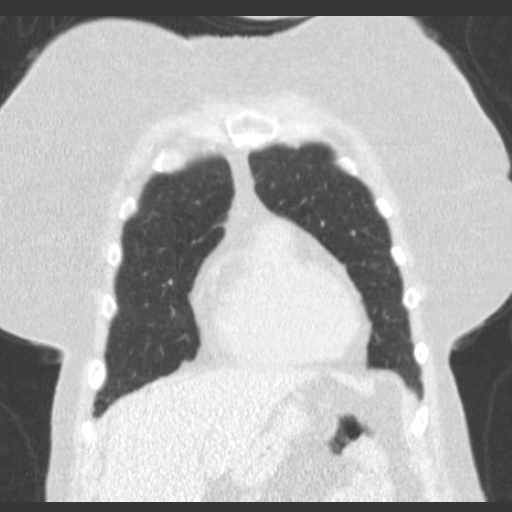
[im 35/87  lung]
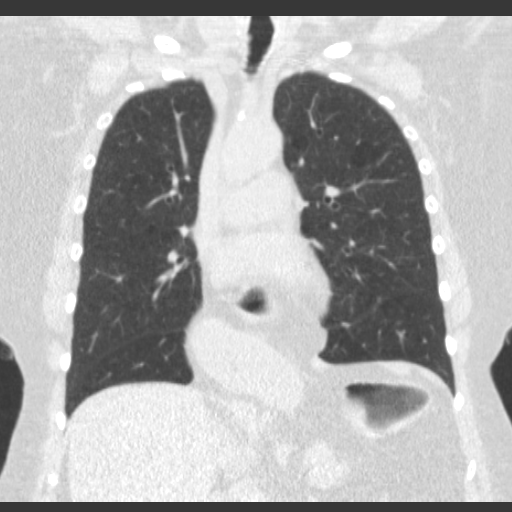
[im 52/87  lung]
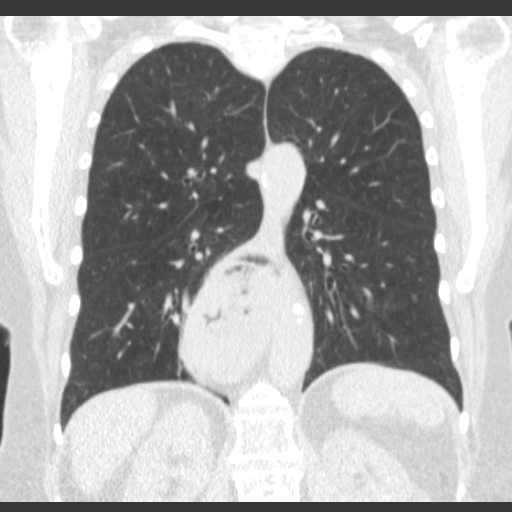

[15 of 36 positions shown; findings below may reference images not displayed]

FINDINGS: Mediastinum/Lymph Nodes: Heart size is normal. There is no
significant pericardial fluid, thickening or pericardial
calcification. There is atherosclerosis of the thoracic aorta, the
great vessels of the mediastinum and the coronary arteries,
including calcified atherosclerotic plaque in the left circumflex
and right coronary arteries. No pathologically enlarged mediastinal,
hilar or axillary lymph nodes. Please note that accurate exclusion
of hilar adenopathy is limited on noncontrast CT scans. Large hiatal
hernia.

Lungs/Pleura: 2 mm nodule in the right middle lobe abutting the
minor fissure (image 141 of series 5). No other larger more
suspicious appearing pulmonary nodules or masses are otherwise
noted. No acute consolidative airspace disease. No pleural
effusions. Mild diffuse bronchial wall thickening with mild
centrilobular emphysema.

Musculoskeletal/Soft Tissues: There are no aggressive appearing
lytic or blastic lesions noted in the visualized portions of the
skeleton.

Upper Abdomen: High density near the gallbladder fossa, likely a
surgical clip from prior cholecystectomy. Atherosclerosis.
IMPRESSION: 1. Lung-RADS Category 2, benign appearance or behavior. Continue
annual screening with low-dose chest CT without contrast in 12
months.
2. Mild diffuse bronchial thickening with mild centrilobular
emphysema; imaging findings suggestive of underlying COPD.
3. Atherosclerosis, including 2 vessel coronary artery disease.
Assessment for potential risk factor modification, dietary therapy
or pharmacologic therapy may be warranted, if clinically indicated.
4. Large hiatal hernia.

## 2014-08-07 NOTE — Telephone Encounter (Signed)
Please contact patient and let her know that I reviewed her her CT chest.  Lungs look ok- plan to repeat in 1 year.  Note is made of hiatal hernia and some calcification of her coronary arteries.  Continue lipitor, add aspirin 81mg  for cardiac protection.

## 2014-08-07 NOTE — Telephone Encounter (Signed)
Notified pt and she voices understanding. 

## 2014-09-23 ENCOUNTER — Other Ambulatory Visit: Payer: Self-pay | Admitting: Family

## 2014-10-30 ENCOUNTER — Telehealth: Payer: Self-pay | Admitting: Family

## 2014-10-30 DIAGNOSIS — H9193 Unspecified hearing loss, bilateral: Secondary | ICD-10-CM

## 2014-10-30 NOTE — Telephone Encounter (Signed)
°  Relation to pt: self  Call back number: [phone]   Reason for call:  Pt states she she is having issues with hearing aid and would like a referral to [facility], [facility]  Address: [address] [place], [place] [address]  [phone]

## 2014-11-20 DIAGNOSIS — H3531 Nonexudative age-related macular degeneration: Secondary | ICD-10-CM | POA: Diagnosis not present

## 2014-11-24 DIAGNOSIS — H906 Mixed conductive and sensorineural hearing loss, bilateral: Secondary | ICD-10-CM | POA: Diagnosis not present

## 2014-12-09 ENCOUNTER — Other Ambulatory Visit: Payer: Self-pay | Admitting: Family

## 2014-12-09 NOTE — Telephone Encounter (Signed)
Is she wanting to try to quit smoking again? If so, ok to send refill but she will need to keep upcoming follow up and contact us if any mood changes occur after starting med.

## 2014-12-09 NOTE — Telephone Encounter (Signed)
Looks like last refills was 08/2013. Pt f/u 02/19/15.  Please advise:  Medication name:  Name from pharmacy:  buPROPion (ZYBAN) 150 MG 12 hr tablet BUPROPION SR 150MG  TABLETS     Sig: TAKE 1 TABLET BY MOUTH TWICE DAILY    Dispense: 60 tablet   Refills: 0   Start: 12/09/2014   Class: Normal    Requested on: 12/09/2014    Originally ordered on: 06/10/2013 11/10/2014

## 2014-12-09 NOTE — Telephone Encounter (Signed)
Left message on home # to return my call. 

## 2014-12-11 NOTE — Telephone Encounter (Signed)
Pt states she is travelling for the next 3 months and does not want refill at this time. She will let us know if she needs refill in the future.

## 2015-02-03 ENCOUNTER — Ambulatory Visit: Payer: Medicare Other | Admitting: Family

## 2015-02-15 ENCOUNTER — Telehealth: Payer: Self-pay

## 2015-02-17 ENCOUNTER — Telehealth: Payer: Self-pay

## 2015-02-17 NOTE — Telephone Encounter (Signed)
Pre visit call completed 

## 2015-02-19 ENCOUNTER — Ambulatory Visit (INDEPENDENT_AMBULATORY_CARE_PROVIDER_SITE_OTHER): Payer: Medicare Other | Admitting: Family

## 2015-02-19 ENCOUNTER — Telehealth: Payer: Self-pay | Admitting: Family

## 2015-02-19 ENCOUNTER — Encounter: Payer: Self-pay | Admitting: Family

## 2015-02-19 VITALS — BP 122/80 | HR 78 | Temp 97.9°F | Resp 16 | Ht 60.0 in | Wt 152.2 lb

## 2015-02-19 DIAGNOSIS — I444 Left anterior fascicular block: Secondary | ICD-10-CM | POA: Insufficient documentation

## 2015-02-19 DIAGNOSIS — Z860100 Personal history of colon polyps, unspecified: Secondary | ICD-10-CM

## 2015-02-19 DIAGNOSIS — Z720 Tobacco use: Secondary | ICD-10-CM

## 2015-02-19 DIAGNOSIS — M810 Age-related osteoporosis without current pathological fracture: Secondary | ICD-10-CM

## 2015-02-19 DIAGNOSIS — Z1239 Encounter for other screening for malignant neoplasm of breast: Secondary | ICD-10-CM

## 2015-02-19 DIAGNOSIS — I10 Essential (primary) hypertension: Secondary | ICD-10-CM | POA: Diagnosis not present

## 2015-02-19 DIAGNOSIS — F17200 Nicotine dependence, unspecified, uncomplicated: Secondary | ICD-10-CM

## 2015-02-19 DIAGNOSIS — K219 Gastro-esophageal reflux disease without esophagitis: Secondary | ICD-10-CM

## 2015-02-19 DIAGNOSIS — E559 Vitamin D deficiency, unspecified: Secondary | ICD-10-CM | POA: Diagnosis not present

## 2015-02-19 DIAGNOSIS — E785 Hyperlipidemia, unspecified: Secondary | ICD-10-CM

## 2015-02-19 DIAGNOSIS — Z0000 Encounter for general adult medical examination without abnormal findings: Secondary | ICD-10-CM | POA: Diagnosis not present

## 2015-02-19 LAB — HEPATIC FUNCTION PANEL
ALBUMIN: 4.2 g/dL (ref 3.5–5.2)
ALK PHOS: 65 U/L (ref 39–117)
ALT: 11 U/L (ref 0–35)
AST: 11 U/L (ref 0–37)
BILIRUBIN DIRECT: 0.1 mg/dL (ref 0.0–0.3)
Total Bilirubin: 0.5 mg/dL (ref 0.2–1.2)
Total Protein: 6.8 g/dL (ref 6.0–8.3)

## 2015-02-19 LAB — BASIC METABOLIC PANEL
BUN: 16 mg/dL (ref 6–23)
CO2: 30 mEq/L (ref 19–32)
Calcium: 9.3 mg/dL (ref 8.4–10.5)
Chloride: 104 mEq/L (ref 96–112)
Creatinine, Ser: 0.99 mg/dL (ref 0.40–1.20)
GFR: 58.03 mL/min — AB (ref >60.00)
Glucose, Bld: 87 mg/dL (ref 70–99)
Potassium: 3.5 mEq/L (ref 3.5–5.1)
SODIUM: 141 meq/L (ref 135–145)

## 2015-02-19 LAB — LDL CHOLESTEROL, DIRECT: Direct LDL: 100 mg/dL

## 2015-02-19 LAB — VITAMIN D 25 HYDROXY (VIT D DEFICIENCY, FRACTURES): VITD: 36.33 ng/mL (ref 30.00–100.00)

## 2015-02-19 LAB — LIPID PANEL
CHOLESTEROL: 182 mg/dL (ref 0–200)
HDL: 59.2 mg/dL (ref >39.00)
NonHDL: 122.39
Total CHOL/HDL Ratio: 3
Triglycerides: 239 mg/dL — ABNORMAL HIGH (ref 0.0–149.0)
VLDL: 47.8 mg/dL — AB (ref 0.0–40.0)

## 2015-02-19 MED ORDER — OMEPRAZOLE 20 MG PO CPDR: 20.0000 mg | DELAYED_RELEASE_CAPSULE | Freq: Two times a day (BID) | ORAL | Status: DC

## 2015-02-19 MED ORDER — HYDROCHLOROTHIAZIDE 25 MG PO TABS: 25.0000 mg | ORAL_TABLET | Freq: Every day | ORAL | Status: DC

## 2015-02-19 MED ORDER — ATORVASTATIN CALCIUM 40 MG PO TABS: 40.0000 mg | ORAL_TABLET | Freq: Every day | ORAL | Status: DC

## 2015-02-19 NOTE — Assessment & Plan Note (Signed)
Stable on hctz, continue same.  

## 2015-02-19 NOTE — Progress Notes (Signed)
Pre visit review using our clinic review tool, if applicable. No additional management support is needed unless otherwise documented below in the visit note. 

## 2015-02-19 NOTE — Progress Notes (Signed)
Subjective:    Judith Flynn is a 74 y.o. female who presents for Medicare Annual/Subsequent preventive examination.  Preventive Screening-Counseling & Management  Tobacco History  Smoking status  . Current Every Day Smoker -- 0.50 packs/day  . Types: Cigarettes  Smokeless tobacco  . Never Used    Comment: 05-22-14  smokes daily     Problems Prior to Visit 1. HTN- maintained on hctz 25 mg.  BP Readings from Last 3 Encounters:  02/19/15 122/80  08/05/14 136/80  05/22/14 126/61   2.  GERD- on omeprazole 20mg .   3.  Osteoporosis-  On annual reclast- due in October.  4.  Vit D def-  On caltrate + D + 2000 units vit d.  5.  Tobacco abuse- not motivated to quit  6.  Hyperlipidemia- on lipitor 40mg .  Lab Results  Component Value Date   CHOL 144 02/10/2014   HDL 68 02/10/2014   LDLCALC 60 02/10/2014   LDLDIRECT 192.5 06/03/2009   TRIG 80 02/10/2014   CHOLHDL 2.1 02/10/2014   7. Preventative-   Immunizations: will call medicare re: zostavax.  Tdap up to date Diet: reports diet is healthy Exercise: not formally exercising Colonoscopy: 2009- was told 5 yr follow up.   Dexa: due Pap Smear: NA Mammogram: due in august    Current Problems (verified) Patient Active Problem List   Diagnosis Date Noted  . GERD (gastroesophageal reflux disease) 06/10/2013  . VITAMIN D DEFICIENCY 10/03/2010  . RHINOSINUSITIS, ACUTE 04/01/2010  . UNSPECIFIED HEARING LOSS 03/07/2010  . ARTHRITIS 03/07/2010  . HYPERLIPIDEMIA 06/23/2009  . Essential hypertension 06/23/2009  . TOBACCO ABUSE 06/03/2009  . Osteoporosis 06/03/2009  . PERSONAL HISTORY OF MALIGNANT NEOPLASM OF BREAST 06/03/2009    Medications Prior to Visit Current Outpatient Prescriptions on File Prior to Visit  Medication Sig Dispense Refill  . acetaminophen (TYLENOL) 325 MG tablet Take 650 mg by mouth every 6 (six) hours as needed.      Marland Kitchen aspirin 81 MG tablet Take 81 mg by mouth daily.    Marland Kitchen atorvastatin (LIPITOR) 40  MG tablet Take 1 tablet (40 mg total) by mouth daily. 90 tablet 1  . Calcium Carbonate-Vit D-Min (CALCIUM 1200) 1200-1000 MG-UNIT CHEW Chew 1 tablet by mouth daily.     . Ergocalciferol (VITAMIN D2) 2000 UNITS TABS Take 1 tablet by mouth daily.      . hydrochlorothiazide (HYDRODIURIL) 25 MG tablet TAKE 1 TABLET EVERY DAY 90 tablet 1  . Multiple Vitamins-Minerals (PRESERVISION AREDS 2) CAPS Take 1 capsule by mouth daily.    Marland Kitchen omeprazole (PRILOSEC) 20 MG capsule TAKE 1 CAPSULE TWICE DAILY 180 capsule 1  . zoledronic acid (RECLAST) 5 MG/100ML SOLN Inject 5 mg into the vein. Annual infusion     No current facility-administered medications on file prior to visit.    Current Medications (verified) Current Outpatient Prescriptions  Medication Sig Dispense Refill  . acetaminophen (TYLENOL) 325 MG tablet Take 650 mg by mouth every 6 (six) hours as needed.      Marland Kitchen aspirin 81 MG tablet Take 81 mg by mouth daily.    Marland Kitchen atorvastatin (LIPITOR) 40 MG tablet Take 1 tablet (40 mg total) by mouth daily. 90 tablet 1  . Calcium Carbonate-Vit D-Min (CALCIUM 1200) 1200-1000 MG-UNIT CHEW Chew 1 tablet by mouth daily.     . Ergocalciferol (VITAMIN D2) 2000 UNITS TABS Take 1 tablet by mouth daily.      . hydrochlorothiazide (HYDRODIURIL) 25 MG tablet TAKE 1 TABLET EVERY DAY  90 tablet 1  . Multiple Vitamins-Minerals (PRESERVISION AREDS 2) CAPS Take 1 capsule by mouth daily.    Marland Kitchen omeprazole (PRILOSEC) 20 MG capsule TAKE 1 CAPSULE TWICE DAILY 180 capsule 1  . zoledronic acid (RECLAST) 5 MG/100ML SOLN Inject 5 mg into the vein. Annual infusion     No current facility-administered medications for this visit.     Allergies (verified) Bupropion and Lisinopril   PAST HISTORY  Family History Family History  Problem Relation Age of Onset  . Alcohol abuse Mother   . Arthritis Mother   . Hypertension Mother 71  . Heart disease Mother     CHF  . Alcohol abuse Father   . Arthritis Father   . Hypertension Father 72   . Heart attack Father     Social History History  Substance Use Topics  . Smoking status: Current Every Day Smoker -- 0.50 packs/day    Types: Cigarettes  . Smokeless tobacco: Never Used     Comment: 05-22-14  smokes daily  . Alcohol Use: Not on file     Are there smokers in your home (other than you)? Yes  Risk Factors Current exercise habits: not exercising regularly  Dietary issues discussed: yes   Cardiac risk factors: advanced age (older than 60 for men, 73 for women), dyslipidemia, sedentary lifestyle and smoking/ tobacco exposure.  Depression Screen (Note: if answer to either of the following is "Yes", a more complete depression screening is indicated)   Over the past two weeks, have you felt down, depressed or hopeless? No  Over the past two weeks, have you felt little interest or pleasure in doing things? No  Have you lost interest or pleasure in daily life? No  Do you often feel hopeless? No  Do you cry easily over simple problems? No  Activities of Daily Living In your present state of health, do you have any difficulty performing the following activities?:  Driving? No Managing money?  No Feeding yourself? No Getting from bed to chair? No. Climbing a flight of stairs? No Preparing food and eating?: No Bathing or showering? No Getting dressed: No Getting to the toilet? No Using the toilet:No Moving around from place to place: No In the past year have you fallen or had a near fall?:No   Are you sexually active?  Yes  Do you have more than one partner?  No  Hearing Difficulties: Yes Do you often ask people to speak up or repeat themselves? No Do you experience ringing or noises in your ears? sometimes Do you have difficulty understanding soft or whispered voices? Yes- working with audiology   Do you feel that you have a problem with memory? No  Do you often misplace items? No  Do you feel safe at home?  Yes  Cognitive Testing  Alert? Yes  Normal  Appearance?Yes  Oriented to person? Yes  Place? Yes   Time? Yes  Recall of three objects?  Yes  Can perform simple calculations? Yes  Displays appropriate judgment?Yes  Can read the correct time from a watch face?Yes   Advanced Directives have been discussed with the patient? Yes  List the Names of Other Physician/Practitioners you currently use: List updated  Indicate any recent Medical Services you may have received from other than [facility] providers in the past year (date may be approximate).  Immunization History  Administered Date(s) Administered  . Influenza Split 04/19/2011, 05/01/2012  . Influenza Whole 05/03/2009, 06/03/2009, 05/02/2010  . Influenza, High Dose Seasonal PF  06/10/2013  . Influenza,inj,Quad PF,36+ Mos 05/22/2014  . Pneumococcal Conjugate-13 02/10/2014  . Pneumococcal Polysaccharide-23 05/07/2006, 06/03/2009  . Td 06/03/2009    Screening Tests Health Maintenance  Topic Date Due  . ZOSTAVAX  07/22/1999  . INFLUENZA VACCINE  02/22/2015  . MAMMOGRAM  03/14/2015  . COLONOSCOPY  08/04/2017  . TETANUS/TDAP  06/04/2019  . DEXA SCAN  Completed  . PNA vac Low Risk Adult  Completed    All answers were reviewed with the patient and necessary referrals were made:  O'SULLIVAN,Jakarius Flamenco S., NP   02/19/2015   History reviewed: allergies, current medications, past family history, past medical history, past social history, past surgical history and problem list  Review of Systems Pertinent items are noted in HPI.    Objective:    Body mass index is 29.72 kg/(m^2). BP 122/80 mmHg  Pulse 78  Temp(Src) 97.9 F (36.6 C) (Oral)  Resp 16  Ht 5' (1.524 m)  Wt 152 lb 3.2 oz (69.037 kg)  BMI 29.72 kg/m2  SpO2 95%      Assessment:     Physical Exam  Constitutional: She is oriented to person, place, and time. She appears well-developed and well-nourished. No distress.  HENT:  Head: Normocephalic and atraumatic.  Right Ear: Tympanic membrane and ear canal  normal.  Left Ear: Tympanic membrane and ear canal normal.  Mouth/Throat: Oropharynx is clear and moist.  Eyes: Pupils are equal, round, and reactive to light. No scleral icterus.  Neck: Normal range of motion. No thyromegaly present.  Cardiovascular: Normal rate and regular rhythm.   No murmur heard. Pulmonary/Chest: Effort normal and breath sounds normal. No respiratory distress. He has no wheezes. She has no rales. She exhibits no tenderness.  Abdominal: Soft. Bowel sounds are normal. He exhibits no distension and no mass. There is no tenderness. There is no rebound and no guarding.  Musculoskeletal: She exhibits no edema.  Lymphadenopathy:    She has no cervical adenopathy.  Neurological: She is alert and oriented to person, place, and time. She has normal reflexes. She exhibits normal muscle tone. Coordination normal.  Skin: Skin is warm and dry.  Psychiatric: She has a normal mood and affect. Her behavior is normal. Judgment and thought content normal.  Breasts: Examined lying Right: Without masses, retractions, discharge or axillary adenopathy.  Left: Without masses. Surgical scar noted left breast at 2 oclock       Assessment & Plan:        Plan:     During the course of the visit the patient was educated and counseled about appropriate screening and preventive services including:    Screening mammography  Bone densitometry screening  Smoking cessation counseling  Advanced directives: pt given paperwork.   Diet review for nutrition referral? Yes ____  Not Indicated _x__   Patient Instructions (the written plan) was given to the patient.  Medicare Attestation I have personally reviewed: The patient's medical and social history Their use of alcohol, tobacco or illicit drugs Their current medications and supplements The patient's functional ability including ADLs,fall risks, home safety risks, cognitive, and hearing and visual impairment Diet and physical  activities Evidence for depression or mood disorders  The patient's weight, height, BMI, and visual acuity have been recorded in the chart.  I have made referrals, counseling, and provided education to the patient based on review of the above and I have provided the patient with a written personalized care plan for preventive services.     Nance Pear., NP  02/19/2015      Patient ID: Judith Flynn, female   DOB: Jul 13, 1941, 74 y.o.   MRN: [id number]

## 2015-02-19 NOTE — Assessment & Plan Note (Signed)
Stable on PPI continue same.  

## 2015-02-19 NOTE — Assessment & Plan Note (Signed)
Check vit d level. Contin vit d

## 2015-02-19 NOTE — Telephone Encounter (Signed)
Reviewed EKG- notes conduction abnormality. I would like her to see Dr. Stanford Breed for further evaluation.

## 2015-02-19 NOTE — Assessment & Plan Note (Signed)
Discussed cessation 

## 2015-02-19 NOTE — Assessment & Plan Note (Signed)
Will order mammogram and bone density, pt to check coverage for zostavax. Work on regular exercise such as walking.

## 2015-02-19 NOTE — Assessment & Plan Note (Signed)
New incidental finding on EKG. Will refer to cardiology for further evaluation (see phone note)

## 2015-02-19 NOTE — Assessment & Plan Note (Signed)
Check bone density, continue reclast

## 2015-02-19 NOTE — Patient Instructions (Signed)
Please complete lab work prior to leaving.   

## 2015-02-19 NOTE — Assessment & Plan Note (Signed)
Continue statin, obtain lipid panel. 

## 2015-02-21 ENCOUNTER — Telehealth: Payer: Self-pay | Admitting: Family

## 2015-02-21 NOTE — Telephone Encounter (Signed)
Cholesterol, liver and vit D look good. Triglycerides are mildly elevated. Please work on avoiding concentrated sweets, and limiting white carbs (rice/bread/pasta/potatoes). Instead substitute whole grain versions with reasonable portions.

## 2015-02-22 NOTE — Telephone Encounter (Signed)
Left message on home # to return my call. 

## 2015-02-22 NOTE — Telephone Encounter (Signed)
Notified pt and she voices understanding. 

## 2015-03-04 NOTE — Telephone Encounter (Signed)
Called the patient - no answer / no voicemail.

## 2015-03-09 NOTE — Telephone Encounter (Signed)
Notified pt and she voices understanding and is agreeable to proceed with consultation. Pt will be out of town 03/15/15 through 03/27/15. Referral updated and signed.

## 2015-03-09 NOTE — Telephone Encounter (Signed)
Medicare wellness completed on 02/19/15.

## 2015-03-15 ENCOUNTER — Ambulatory Visit (HOSPITAL_BASED_OUTPATIENT_CLINIC_OR_DEPARTMENT_OTHER)
Admission: RE | Admit: 2015-03-15 | Discharge: 2015-03-15 | Disposition: A | Discharge status: Home / Self Care | Payer: Medicare Other | Source: Ambulatory Visit | Attending: Family | Admitting: Family

## 2015-03-15 DIAGNOSIS — Z1239 Encounter for other screening for malignant neoplasm of breast: Secondary | ICD-10-CM

## 2015-03-15 DIAGNOSIS — M810 Age-related osteoporosis without current pathological fracture: Secondary | ICD-10-CM

## 2015-03-15 DIAGNOSIS — Z1231 Encounter for screening mammogram for malignant neoplasm of breast: Secondary | ICD-10-CM | POA: Insufficient documentation

## 2015-03-18 ENCOUNTER — Telehealth: Payer: Self-pay | Admitting: Family

## 2015-03-18 ENCOUNTER — Encounter: Payer: Self-pay | Admitting: Family

## 2015-03-18 NOTE — Telephone Encounter (Signed)
See letter.

## 2015-04-30 NOTE — Progress Notes (Signed)
HPI: 74 year old female for evaluation of abnormal electrocardiogram. No prior cardiac history. Chest CT January 2016 showed emphysema and two-vessel coronary artery disease. Patient denies DOE, orthopnea, PND, pedal edema, claudication, chest pain or syncope.   Current Outpatient Prescriptions  Medication Sig Dispense Refill  . acetaminophen (TYLENOL) 325 MG tablet Take 650 mg by mouth every 6 (six) hours as needed.      Marland Kitchen aspirin 81 MG tablet Take 81 mg by mouth daily.    Marland Kitchen atorvastatin (LIPITOR) 40 MG tablet Take 1 tablet (40 mg total) by mouth daily. 90 tablet 1  . Calcium Carbonate-Vit D-Min (CALCIUM 1200) 1200-1000 MG-UNIT CHEW Chew 1 tablet by mouth daily.     . Ergocalciferol (VITAMIN D2) 2000 UNITS TABS Take 1 tablet by mouth daily.      . hydrochlorothiazide (HYDRODIURIL) 25 MG tablet Take 1 tablet (25 mg total) by mouth daily. 90 tablet 1  . Multiple Vitamins-Minerals (PRESERVISION AREDS 2) CAPS Take 1 capsule by mouth daily.    Marland Kitchen omeprazole (PRILOSEC) 20 MG capsule Take 1 capsule (20 mg total) by mouth 2 (two) times daily. 180 capsule 1  . zoledronic acid (RECLAST) 5 MG/100ML SOLN Inject 5 mg into the vein. Annual infusion     No current facility-administered medications for this visit.    Allergies  Allergen Reactions  . Bupropion     tremors  . Lisinopril     REACTION: cough     Past Medical History  Diagnosis Date  . Colon polyp   . Hearing loss   . Osteoporosis   . Tobacco abuse   . Cancer [facility]) 2000    breast- left breast- s/p lumpectomy and radiation  . Hypertension   . Hyperlipidemia     Past Surgical History  Procedure Laterality Date  . Breast biopsy  2000  . Surgery on ear drum      right  . Breast lumpectomy  2000    left  . Cataract extraction, bilateral Bilateral July and August 2015  . Cholecystectomy      Social History   Social History  . Marital Status: Widowed    Spouse Name: N/A  . Number of Children: 3  . Years of  Education: N/A   Occupational History  . retired    Social History Main Topics  . Smoking status: Current Every Day Smoker -- 0.50 packs/day    Types: Cigarettes  . Smokeless tobacco: Never Used     Comment: 05-22-14  smokes daily  . Alcohol Use: 0.0 oz/week    0 Standard drinks or equivalent per week     Comment: Rare  . Drug Use: Not on file  . Sexual Activity: Not on file   Other Topics Concern  . Not on file   Social History Narrative   Widow/ widower   Regular exercise- yes   Originally from [place]          Family History  Problem Relation Age of Onset  . Alcohol abuse Mother   . Arthritis Mother   . Hypertension Mother 26  . Heart disease Mother     CHF  . Alcohol abuse Father   . Arthritis Father   . Hypertension Father 71  . Heart attack Father     Died of MI at age 61    ROS: Chronic back pain but no fevers or chills, productive cough, hemoptysis, dysphasia, odynophagia, melena, hematochezia, dysuria, hematuria, rash, seizure activity, orthopnea, PND, pedal edema, claudication. Remaining  systems are negative.  Physical Exam:   Blood pressure 128/84, pulse 92, height 5' (1.524 m), weight 68.493 kg (151 lb).  General:  Well developed/well nourished in NAD Skin warm/dry Patient not depressed No peripheral clubbing Back-normal HEENT-normal/normal eyelids Neck supple/normal carotid upstroke bilaterally; no bruits; no JVD; no thyromegaly chest - CTA/ normal expansion CV - RRR/normal S1 and S2; no murmurs, rubs or gallops;  PMI nondisplaced Abdomen -NT/ND, no HSM, no mass, + bowel sounds, no bruit 2+ femoral pulses, no bruits Ext-no edema, chords, 2+ DP Neuro-grossly nonfocal  ECG 02/19/2015 sinus rhythm, left anterior fascicular block.

## 2015-05-04 ENCOUNTER — Ambulatory Visit (INDEPENDENT_AMBULATORY_CARE_PROVIDER_SITE_OTHER): Payer: Medicare Other | Admitting: Cardiology

## 2015-05-04 ENCOUNTER — Encounter: Payer: Self-pay | Admitting: Cardiology

## 2015-05-04 ENCOUNTER — Encounter: Payer: Self-pay | Admitting: *Deleted

## 2015-05-04 ENCOUNTER — Ambulatory Visit: Payer: Medicare Other | Admitting: Family

## 2015-05-04 VITALS — BP 128/84 | HR 92 | Ht 60.0 in | Wt 151.0 lb

## 2015-05-04 DIAGNOSIS — I10 Essential (primary) hypertension: Secondary | ICD-10-CM

## 2015-05-04 DIAGNOSIS — R9431 Abnormal electrocardiogram [ECG] [EKG]: Secondary | ICD-10-CM

## 2015-05-04 NOTE — Assessment & Plan Note (Signed)
Patient counseled on discontinuing. 

## 2015-05-04 NOTE — Assessment & Plan Note (Signed)
Patient's electrocardiogram today shows sinus rhythm, Left anterior fascicular block and prior inferior infarct cannot be excluded. Previous CT showed coronary calcification and she has multiple risk factors including long history of tobacco use. Plan Lexiscan nuclear study for risk stratification.

## 2015-05-04 NOTE — Assessment & Plan Note (Signed)
Continue statin. 

## 2015-05-04 NOTE — Assessment & Plan Note (Signed)
Blood pressure controlled. Continue present medications. 

## 2015-05-04 NOTE — Patient Instructions (Signed)
Your physician recommends that you schedule a follow-up appointment in:  AS NEEDED PENDING TEST RESULTS  Your physician has requested that you have a lexiscan myoview. For further information please visit [email or web address]. Please follow instruction sheet, as given.   

## 2015-05-05 ENCOUNTER — Encounter: Payer: Self-pay | Admitting: Family

## 2015-05-05 ENCOUNTER — Ambulatory Visit (INDEPENDENT_AMBULATORY_CARE_PROVIDER_SITE_OTHER): Payer: Medicare Other | Admitting: Family

## 2015-05-05 VITALS — BP 113/65 | HR 71 | Temp 98.1°F | Ht 60.0 in | Wt 151.8 lb

## 2015-05-05 DIAGNOSIS — Z23 Encounter for immunization: Secondary | ICD-10-CM

## 2015-05-05 DIAGNOSIS — K219 Gastro-esophageal reflux disease without esophagitis: Secondary | ICD-10-CM | POA: Diagnosis not present

## 2015-05-05 DIAGNOSIS — I10 Essential (primary) hypertension: Secondary | ICD-10-CM

## 2015-05-05 DIAGNOSIS — E785 Hyperlipidemia, unspecified: Secondary | ICD-10-CM

## 2015-05-05 DIAGNOSIS — M810 Age-related osteoporosis without current pathological fracture: Secondary | ICD-10-CM

## 2015-05-05 NOTE — Progress Notes (Signed)
Subjective:    Patient ID: Judith Flynn, female    DOB: Dec 12, 1940, 74 y.o.   MRN: [id number]  HPI  Judith Flynn is a 74 yr old female who presents today for follow up.  1) HTN- maintained on hctz 25mg  once daily. She denies CP/SOB or swelling.  BP Readings from Last 3 Encounters:  05/05/15 113/65  05/04/15 128/84  02/19/15 122/80   2) Hyperlipidemia- maintained on lipitor.   Lab Results  Component Value Date   CHOL 182 02/19/2015   HDL 59.20 02/19/2015   LDLCALC 60 02/10/2014   LDLDIRECT 100.0 02/19/2015   TRIG 239.0* 02/19/2015   CHOLHDL 3 02/19/2015   3) Osteoporosis- maintained on reclast- due in the end of October.  This is usually given by Dr. Antonieta Pert office.    4) GERD- maintained on omeprazole. Reports symptoms well controlled.     Abnormal EKG- she saw Dr. Stanford Breed yesterday and he recommended nuclear stress test.    Review of Systems See HPI  Past Medical History  Diagnosis Date  . Colon polyp   . Hearing loss   . Osteoporosis   . Tobacco abuse   . Cancer [facility]) 2000    breast- left breast- s/p lumpectomy and radiation  . Hypertension   . Hyperlipidemia     Social History   Social History  . Marital Status: Widowed    Spouse Name: N/A  . Number of Children: 3  . Years of Education: N/A   Occupational History  . retired    Social History Main Topics  . Smoking status: Current Every Day Smoker -- 0.50 packs/day    Types: Cigarettes  . Smokeless tobacco: Never Used     Comment: 05-22-14  smokes daily  . Alcohol Use: 0.0 oz/week    0 Standard drinks or equivalent per week     Comment: Rare  . Drug Use: Not on file  . Sexual Activity: Not on file   Other Topics Concern  . Not on file   Social History Narrative   Widow/ widower   Regular exercise- yes   Originally from [place]          Past Surgical History  Procedure Laterality Date  . Breast biopsy  2000  . Surgery on ear drum      right  . Breast lumpectomy  2000    left   . Cataract extraction, bilateral Bilateral July and August 2015  . Cholecystectomy      Family History  Problem Relation Age of Onset  . Alcohol abuse Mother   . Arthritis Mother   . Hypertension Mother 2  . Heart disease Mother     CHF  . Alcohol abuse Father   . Arthritis Father   . Hypertension Father 10  . Heart attack Father     Died of MI at age 30    Allergies  Allergen Reactions  . Bupropion     tremors  . Lisinopril     REACTION: cough    Current Outpatient Prescriptions on File Prior to Visit  Medication Sig Dispense Refill  . acetaminophen (TYLENOL) 325 MG tablet Take 650 mg by mouth every 6 (six) hours as needed.      Marland Kitchen aspirin 81 MG tablet Take 81 mg by mouth daily.    Marland Kitchen atorvastatin (LIPITOR) 40 MG tablet Take 1 tablet (40 mg total) by mouth daily. 90 tablet 1  . Calcium Carbonate-Vit D-Min (CALCIUM 1200) 1200-1000 MG-UNIT CHEW Chew 1 tablet  by mouth daily.     . Ergocalciferol (VITAMIN D2) 2000 UNITS TABS Take 1 tablet by mouth daily.      . hydrochlorothiazide (HYDRODIURIL) 25 MG tablet Take 1 tablet (25 mg total) by mouth daily. 90 tablet 1  . Multiple Vitamins-Minerals (PRESERVISION AREDS 2) CAPS Take 1 capsule by mouth daily.    Marland Kitchen omeprazole (PRILOSEC) 20 MG capsule Take 1 capsule (20 mg total) by mouth 2 (two) times daily. 180 capsule 1  . zoledronic acid (RECLAST) 5 MG/100ML SOLN Inject 5 mg into the vein. Annual infusion     No current facility-administered medications on file prior to visit.    BP 113/65 mmHg  Pulse 71  Temp(Src) 98.1 F (36.7 C) (Oral)  Ht 5' (1.524 m)  Wt 151 lb 12.8 oz (68.856 kg)  BMI 29.65 kg/m2  SpO2 97%       Objective:   Physical Exam  Constitutional: She is oriented to person, place, and time. She appears well-developed and well-nourished.  HENT:  Head: Normocephalic and atraumatic.  Cardiovascular: Normal rate, regular rhythm and normal heart sounds.   No murmur heard. Pulmonary/Chest: Effort normal and  breath sounds normal. No respiratory distress. She has no wheezes.  Musculoskeletal: Normal range of motion. She exhibits no edema.  Neurological: She is alert and oriented to person, place, and time.  Psychiatric: She has a normal mood and affect. Her behavior is normal. Judgment and thought content normal.          Assessment & Plan:

## 2015-05-05 NOTE — Patient Instructions (Signed)
Continue to work on quitting smoking.   Work on diet to lower your triglycerides.  Your triglycerides are mildly elevated. Please work on avoiding concentrated sweets, and limiting white carbs (rice/bread/pasta/potatoes). Instead substitute whole grain versions with reasonable portions.

## 2015-05-05 NOTE — Progress Notes (Signed)
Pre visit review using our clinic review tool, if applicable. No additional management support is needed unless otherwise documented below in the visit note. 

## 2015-05-06 ENCOUNTER — Encounter: Payer: Self-pay | Admitting: Cardiology

## 2015-05-07 NOTE — Assessment & Plan Note (Signed)
BP stable on current meds.   

## 2015-05-07 NOTE — Assessment & Plan Note (Signed)
Stable on PPI 

## 2015-05-07 NOTE — Assessment & Plan Note (Signed)
Continue annual reclast 

## 2015-05-07 NOTE — Assessment & Plan Note (Signed)
LDL at goal. Discussed avoiding concentrated sweets/white fluffy carbs due to elevated trigs.

## 2015-05-13 ENCOUNTER — Telehealth (HOSPITAL_COMMUNITY): Payer: Self-pay

## 2015-05-13 NOTE — Telephone Encounter (Signed)
Encounter complete. 

## 2015-05-18 ENCOUNTER — Ambulatory Visit (HOSPITAL_COMMUNITY)
Admission: RE | Admit: 2015-05-18 | Discharge: 2015-05-18 | Disposition: A | Discharge status: Home / Self Care | Payer: Medicare Other | Source: Ambulatory Visit | Attending: Cardiology | Admitting: Cardiology

## 2015-05-18 DIAGNOSIS — R9431 Abnormal electrocardiogram [ECG] [EKG]: Secondary | ICD-10-CM | POA: Diagnosis not present

## 2015-05-18 DIAGNOSIS — Z8249 Family history of ischemic heart disease and other diseases of the circulatory system: Secondary | ICD-10-CM | POA: Insufficient documentation

## 2015-05-18 DIAGNOSIS — R42 Dizziness and giddiness: Secondary | ICD-10-CM | POA: Diagnosis not present

## 2015-05-18 DIAGNOSIS — F17200 Nicotine dependence, unspecified, uncomplicated: Secondary | ICD-10-CM | POA: Diagnosis not present

## 2015-05-18 DIAGNOSIS — R0609 Other forms of dyspnea: Secondary | ICD-10-CM | POA: Diagnosis not present

## 2015-05-18 DIAGNOSIS — I10 Essential (primary) hypertension: Secondary | ICD-10-CM | POA: Diagnosis not present

## 2015-05-18 LAB — MYOCARDIAL PERFUSION IMAGING
CHL CUP NUCLEAR SSS: 4
CSEPPHR: 101 {beats}/min
LVDIAVOL: 52 mL
LVSYSVOL: 8 mL
NUC STRESS TID: 0.78
Rest HR: 64 {beats}/min
SDS: 4
SRS: 0

## 2015-05-18 IMAGING — NM NM MISC PROCEDURE
6 series · 36 of 36 positions shown · non-contrast
Comparison: none

[Series 1: wbr rest · 6.40mm/px · 6 of 64 frames shown]
[frame 6/64]
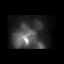
[frame 16/64]
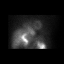
[frame 27/64]
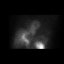
[frame 38/64]
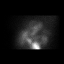
[frame 48/64]
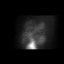
[frame 59/64]
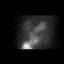

[Series 1: wbr_r-proj_st wbr rest · 6.40mm/px · 6 of 64 frames shown]
[frame 6/64]
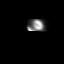
[frame 16/64]
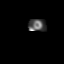
[frame 27/64]
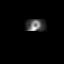
[frame 38/64]
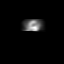
[frame 48/64]
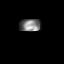
[frame 59/64]
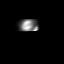

[Series 2: wbr_s-proj_st wbr stress-gsp · 6.40mm/px · 6 of 512 frames shown]
[frame 43/512]
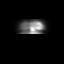
[frame 128/512]
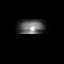
[frame 214/512]
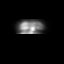
[frame 299/512]
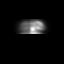
[frame 384/512]
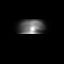
[frame 470/512]
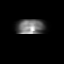

[Series 2: wbr stress-gsp · 6.40mm/px · 6 of 512 frames shown]
[frame 43/512]
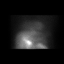
[frame 128/512]
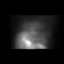
[frame 214/512]
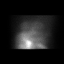
[frame 299/512]
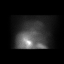
[frame 384/512]
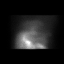
[frame 470/512]
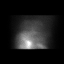

[Series 3: wbr stress-sum-em · 6.40mm/px · 6 of 64 frames shown]
[frame 6/64]
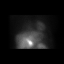
[frame 16/64]
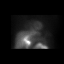
[frame 27/64]
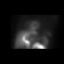
[frame 38/64]
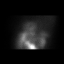
[frame 48/64]
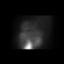
[frame 59/64]
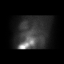

[Series 3: wbr_s-proj_st wbr stress-sum-em · 6.40mm/px · 6 of 64 frames shown]
[frame 6/64]
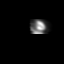
[frame 16/64]
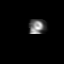
[frame 27/64]
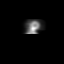
[frame 38/64]
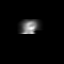
[frame 48/64]
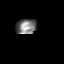
[frame 59/64]
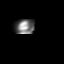

[36 of 36 positions shown; findings below may reference images not displayed]

Canned report from images found in remote index.

Refer to host system for actual result text.

## 2015-05-18 MED ORDER — REGADENOSON 0.4 MG/5ML IV SOLN
0.4000 mg | Freq: Once | INTRAVENOUS | Status: AC
  Administered 2015-05-18: 0.4 mg via INTRAVENOUS

## 2015-05-18 MED ORDER — TECHNETIUM TC 99M SESTAMIBI GENERIC - CARDIOLITE
31.7000 | Freq: Once | INTRAVENOUS | Status: AC | PRN
  Administered 2015-05-18: 31.7 via INTRAVENOUS

## 2015-05-18 MED ORDER — TECHNETIUM TC 99M SESTAMIBI GENERIC - CARDIOLITE
11.0000 | Freq: Once | INTRAVENOUS | Status: AC | PRN
  Administered 2015-05-18: 11 via INTRAVENOUS

## 2015-05-21 ENCOUNTER — Ambulatory Visit (HOSPITAL_BASED_OUTPATIENT_CLINIC_OR_DEPARTMENT_OTHER): Payer: Medicare Other | Admitting: Family

## 2015-05-21 ENCOUNTER — Other Ambulatory Visit (HOSPITAL_BASED_OUTPATIENT_CLINIC_OR_DEPARTMENT_OTHER): Payer: Medicare Other

## 2015-05-21 ENCOUNTER — Encounter: Payer: Self-pay | Admitting: Family

## 2015-05-21 ENCOUNTER — Ambulatory Visit (HOSPITAL_BASED_OUTPATIENT_CLINIC_OR_DEPARTMENT_OTHER): Payer: Medicare Other

## 2015-05-21 VITALS — BP 133/67 | HR 72 | Temp 97.8°F | Resp 16 | Ht 60.0 in | Wt 150.0 lb

## 2015-05-21 DIAGNOSIS — M810 Age-related osteoporosis without current pathological fracture: Secondary | ICD-10-CM

## 2015-05-21 DIAGNOSIS — E559 Vitamin D deficiency, unspecified: Secondary | ICD-10-CM

## 2015-05-21 DIAGNOSIS — Z853 Personal history of malignant neoplasm of breast: Secondary | ICD-10-CM

## 2015-05-21 DIAGNOSIS — T386X5A Adverse effect of antigonadotrophins, antiestrogens, antiandrogens, not elsewhere classified, initial encounter: Secondary | ICD-10-CM

## 2015-05-21 DIAGNOSIS — Z86000 Personal history of in-situ neoplasm of breast: Secondary | ICD-10-CM | POA: Diagnosis not present

## 2015-05-21 DIAGNOSIS — M818 Other osteoporosis without current pathological fracture: Secondary | ICD-10-CM

## 2015-05-21 LAB — CBC WITH DIFFERENTIAL (CANCER CENTER ONLY)
BASO#: 0 10*3/uL (ref 0.0–0.2)
BASO%: 0.6 % (ref 0.0–2.0)
EOS ABS: 0 10*3/uL (ref 0.0–0.5)
EOS%: 0.6 % (ref 0.0–7.0)
HEMATOCRIT: 41.7 % (ref 34.8–46.6)
HEMOGLOBIN: 14.1 g/dL (ref 11.6–15.9)
LYMPH#: 1.5 10*3/uL (ref 0.9–3.3)
LYMPH%: 23.1 % (ref 14.0–48.0)
MCH: 31.6 pg (ref 26.0–34.0)
MCHC: 33.8 g/dL (ref 32.0–36.0)
MCV: 94 fL (ref 81–101)
MONO#: 0.5 10*3/uL (ref 0.1–0.9)
MONO%: 7.2 % (ref 0.0–13.0)
NEUT%: 68.5 % (ref 39.6–80.0)
NEUTROS ABS: 4.4 10*3/uL (ref 1.5–6.5)
Platelets: 191 10*3/uL (ref 145–400)
RBC: 4.46 10*6/uL (ref 3.70–5.32)
RDW: 12.6 % (ref 11.1–15.7)
WBC: 6.4 10*3/uL (ref 3.9–10.0)

## 2015-05-21 LAB — CMP (CANCER CENTER ONLY)
ALT(SGPT): 18 U/L (ref 10–47)
AST: 16 U/L (ref 11–38)
Albumin: 3.9 g/dL (ref 3.3–5.5)
Alkaline Phosphatase: 57 U/L (ref 26–84)
BUN, Bld: 17 mg/dL (ref 7–22)
CO2: 29 mEq/L (ref 18–33)
Calcium: 9 mg/dL (ref 8.0–10.3)
Chloride: 105 mEq/L (ref 98–108)
Creat: 1 mg/dl (ref 0.6–1.2)
Glucose, Bld: 98 mg/dL (ref 73–118)
Potassium: 3.2 mEq/L — ABNORMAL LOW (ref 3.3–4.7)
Sodium: 142 mEq/L (ref 128–145)
Total Bilirubin: 0.9 mg/dl (ref 0.20–1.60)
Total Protein: 6.8 g/dL (ref 6.4–8.1)

## 2015-05-21 MED ORDER — SODIUM CHLORIDE 0.9 % IV SOLN
INTRAVENOUS | Status: DC
  Administered 2015-05-21: 10:00:00 via INTRAVENOUS

## 2015-05-21 MED ORDER — ZOLEDRONIC ACID 4 MG/100ML IV SOLN
4.0000 mg | Freq: Once | INTRAVENOUS | Status: AC
  Administered 2015-05-21: 4 mg via INTRAVENOUS
  Filled 2015-05-21: qty 100

## 2015-05-21 NOTE — Patient Instructions (Signed)

## 2015-05-21 NOTE — Progress Notes (Signed)
Hematology and Oncology Follow Up Visit  Judith Flynn [id number] 03-Jan-1941 74 y.o. 05/21/2015   Principle Diagnosis:  1. Ductal carcinoma in situ of the left breast 2. Osteoporosis  Current Therapy:   Zometa 4 mg once a year    Interim History:  Judith Flynn is here today for her annual follow-up. She is doing well and has no complaints at this time. She had her mammogram in August which was negative.  She continues to do self breast exams at home. Her exam today was negative. No changes were noted. Her bone density test in August showed osteoporosis. She is currently taking Vitamin D and calcium supplements. She will also get her Zometa today.  No fever, chills, n/v, cough, rash, dizziness, SOB, chest pain, palpitations, abdominal pain or changes in bowel or bladder habits.  She has had no swelling, tenderness, numbness or tingling in her extremities. No c/o joint or "bone" pain.  She has a great appetite and is eating healthy. She stays well hydrated but admits she could be drinking more water. Her weight is unchanged.   Medications:    Medication List       This list is accurate as of: 05/21/15 10:00 AM.  Always use your most recent med list.               acetaminophen 325 MG tablet  Commonly known as:  TYLENOL  Take 650 mg by mouth every 6 (six) hours as needed.     aspirin 81 MG tablet  Take 81 mg by mouth daily.     atorvastatin 40 MG tablet  Commonly known as:  LIPITOR  Take 1 tablet (40 mg total) by mouth daily.     CALCIUM 1200 1200-1000 MG-UNIT Chew  Chew 1 tablet by mouth daily.     hydrochlorothiazide 25 MG tablet  Commonly known as:  HYDRODIURIL  Take 1 tablet (25 mg total) by mouth daily.     omeprazole 20 MG capsule  Commonly known as:  PRILOSEC  Take 1 capsule (20 mg total) by mouth 2 (two) times daily.     PRESERVISION AREDS 2 Caps  Take 1 capsule by mouth daily.     RECLAST 5 MG/100ML Soln injection  Generic drug:  zoledronic acid    Inject 5 mg into the vein. Annual infusion     Vitamin D2 2000 UNITS Tabs  Take 1 tablet by mouth daily.        Allergies:  Allergies  Allergen Reactions  . Bupropion     tremors  . Lisinopril     REACTION: cough    Past Medical History, Surgical history, Social history, and Family History were reviewed and updated.  Review of Systems: All other 10 point review of systems is negative.   Physical Exam:  height is 5' (1.524 m) and weight is 150 lb (68.04 kg). Her oral temperature is 97.8 F (36.6 C). Her blood pressure is 133/67 and her pulse is 72. Her respiration is 16.   Wt Readings from Last 3 Encounters:  05/21/15 150 lb (68.04 kg)  05/18/15 151 lb (68.493 kg)  05/05/15 151 lb 12.8 oz (68.856 kg)    Ocular: Sclerae unicteric, pupils equal, round and reactive to light Ear-nose-throat: Oropharynx clear, dentition fair Lymphatic: No cervical or supraclavicular adenopathy Lungs no rales or rhonchi, good excursion bilaterally Heart regular rate and rhythm, no murmur appreciated Abd soft, nontender, positive bowel sounds MSK no focal spinal tenderness, no joint edema Neuro: non-focal, well-oriented,  appropriate affect Breasts: No changes. Lumpectomy scar on left breast is intact. No mass, lesion, rash or lymphadenopathy found on exam. She continues to do self breast exams at home.   Lab Results  Component Value Date   WBC 6.4 05/21/2015   HGB 14.1 05/21/2015   HCT 41.7 05/21/2015   MCV 94 05/21/2015   PLT 191 05/21/2015   No results found for: FERRITIN, IRON, TIBC, UIBC, IRONPCTSAT Lab Results  Component Value Date   RBC 4.46 05/21/2015   No results found for: KPAFRELGTCHN, LAMBDASER, KAPLAMBRATIO No results found for: IGGSERUM, IGA, IGMSERUM No results found for: Judith Flynn, SPEI   Chemistry      Component Value Date/Time   NA 141 02/19/2015 1021   NA 142 05/23/2012 1130   K 3.5 02/19/2015 1021   K  3.8 05/23/2012 1130   CL 104 02/19/2015 1021   CL 104 05/23/2012 1130   CO2 30 02/19/2015 1021   CO2 28 05/23/2012 1130   BUN 16 02/19/2015 1021   BUN 15 05/23/2012 1130   CREATININE 0.99 02/19/2015 1021   CREATININE 0.98 02/27/2014 1009      Component Value Date/Time   CALCIUM 9.3 02/19/2015 1021   CALCIUM 8.9 05/23/2012 1130   ALKPHOS 65 02/19/2015 1021   ALKPHOS 49 05/23/2012 1130   AST 11 02/19/2015 1021   AST 18 05/23/2012 1130   ALT 11 02/19/2015 1021   ALT 17 05/23/2012 1130   BILITOT 0.5 02/19/2015 1021   BILITOT 0.60 05/23/2012 1130      Impression and Plan: Judith Flynn isa pleasant 74 yo white female with history of ductal carcinoma in situ of the left breast. She was diagnosed over 16 years ago and had a lumpectomy at that time. She was the treated with radiation and completed 5 years of tamoxifen for 5 years in 2006.  She does have osteoporosis and is on vitamin D and calcium supplements. She also received Zometa once a year. We will proceed with her infusion today as planned.  She is doing well and is asymptomatic at this time. So far, there has been no evidence of recurrence.  We will plan to see her back in 1 year for labs, follow-up and infusion.  She will contact us with any questions or concerns. We can certainly see her sooner if need be.   Eliezer Bottom, NP 10/28/201610:00 AM

## 2015-05-22 LAB — VITAMIN D 25 HYDROXY (VIT D DEFICIENCY, FRACTURES): Vit D, 25-Hydroxy: 34 ng/mL (ref 30–100)

## 2015-05-25 DIAGNOSIS — H353131 Nonexudative age-related macular degeneration, bilateral, early dry stage: Secondary | ICD-10-CM | POA: Diagnosis not present

## 2015-07-01 ENCOUNTER — Other Ambulatory Visit: Payer: Self-pay | Admitting: Family

## 2015-07-14 ENCOUNTER — Other Ambulatory Visit: Payer: Self-pay | Admitting: Family

## 2015-07-21 ENCOUNTER — Other Ambulatory Visit: Payer: Self-pay | Admitting: Nurse Practitioner

## 2015-11-03 ENCOUNTER — Ambulatory Visit: Payer: Medicare Other | Admitting: Family

## 2015-12-13 ENCOUNTER — Telehealth: Payer: Self-pay | Admitting: Family

## 2015-12-13 ENCOUNTER — Ambulatory Visit (INDEPENDENT_AMBULATORY_CARE_PROVIDER_SITE_OTHER): Payer: Medicare Other | Admitting: Family

## 2015-12-13 ENCOUNTER — Encounter: Payer: Self-pay | Admitting: Family

## 2015-12-13 VITALS — BP 128/70 | HR 68 | Temp 98.1°F | Resp 16 | Ht 60.0 in | Wt 151.0 lb

## 2015-12-13 DIAGNOSIS — E559 Vitamin D deficiency, unspecified: Secondary | ICD-10-CM | POA: Diagnosis not present

## 2015-12-13 DIAGNOSIS — E785 Hyperlipidemia, unspecified: Secondary | ICD-10-CM

## 2015-12-13 DIAGNOSIS — M810 Age-related osteoporosis without current pathological fracture: Secondary | ICD-10-CM | POA: Diagnosis not present

## 2015-12-13 DIAGNOSIS — I10 Essential (primary) hypertension: Secondary | ICD-10-CM | POA: Diagnosis not present

## 2015-12-13 DIAGNOSIS — Z1231 Encounter for screening mammogram for malignant neoplasm of breast: Secondary | ICD-10-CM

## 2015-12-13 LAB — LIPID PANEL
CHOLESTEROL: 192 mg/dL (ref 0–200)
HDL: 49.7 mg/dL (ref >39.00)
LDL Cholesterol: 117 mg/dL — ABNORMAL HIGH (ref 0–99)
NONHDL: 141.92
Total CHOL/HDL Ratio: 4
Triglycerides: 126 mg/dL (ref 0.0–149.0)
VLDL: 25.2 mg/dL (ref 0.0–40.0)

## 2015-12-13 LAB — BASIC METABOLIC PANEL
BUN: 13 mg/dL (ref 6–23)
CALCIUM: 9 mg/dL (ref 8.4–10.5)
CO2: 26 mEq/L (ref 19–32)
Chloride: 109 mEq/L (ref 96–112)
Creatinine, Ser: 0.81 mg/dL (ref 0.40–1.20)
GFR: 72.99 mL/min (ref >60.00)
GLUCOSE: 95 mg/dL (ref 70–99)
Potassium: 3.6 mEq/L (ref 3.5–5.1)
SODIUM: 143 meq/L (ref 135–145)

## 2015-12-13 LAB — VITAMIN D 25 HYDROXY (VIT D DEFICIENCY, FRACTURES): VITD: 28.62 ng/mL — AB (ref 30.00–100.00)

## 2015-12-13 MED ORDER — ATORVASTATIN CALCIUM 40 MG PO TABS: 40.0000 mg | ORAL_TABLET | Freq: Every day | ORAL | Status: DC

## 2015-12-13 MED ORDER — OMEPRAZOLE 20 MG PO CPDR: 20.0000 mg | DELAYED_RELEASE_CAPSULE | Freq: Two times a day (BID) | ORAL | Status: DC

## 2015-12-13 MED ORDER — VITAMIN D (ERGOCALCIFEROL) 1.25 MG (50000 UNIT) PO CAPS: 50000.0000 [IU] | ORAL_CAPSULE | ORAL | Status: DC

## 2015-12-13 MED ORDER — HYDROCHLOROTHIAZIDE 25 MG PO TABS: 25.0000 mg | ORAL_TABLET | Freq: Every day | ORAL | Status: DC

## 2015-12-13 NOTE — Patient Instructions (Signed)
Please complete lab work prior to leaving.   

## 2015-12-13 NOTE — Progress Notes (Signed)
Pre visit review using our clinic review tool, if applicable. No additional management support is needed unless otherwise documented below in the visit note. 

## 2015-12-13 NOTE — Assessment & Plan Note (Signed)
BP is stable on hctz, continue same, obtain follow up bmet.

## 2015-12-13 NOTE — Assessment & Plan Note (Signed)
Due for reclast 10/17.

## 2015-12-13 NOTE — Assessment & Plan Note (Signed)
Continue vit D. Obtain vit d level

## 2015-12-13 NOTE — Telephone Encounter (Signed)
Vit D is low. D/c otc supplement, rx sent to mail order for 50000 iu weekly x 12 weeks. Follow up vit D level in 12 weeks. Cholesterol looks good. Kidney function and electrolytes are normal.

## 2015-12-13 NOTE — Progress Notes (Signed)
Subjective:    Patient ID: Judith Flynn, female    DOB: 1940-11-13, 74 y.o.   MRN: [id number]  HPI  Judith Flynn is a 75 yr old female who presents today for follow up.  1) HTN- on hctz.  BP Readings from Last 3 Encounters:  12/13/15 128/70  05/21/15 133/67  05/05/15 113/65   2) Hyperlipidemia- on atorvastatin 40mg .  Lab Results  Component Value Date   CHOL 182 02/19/2015   HDL 59.20 02/19/2015   LDLCALC 60 02/10/2014   LDLDIRECT 100.0 02/19/2015   TRIG 239.0* 02/19/2015   CHOLHDL 3 02/19/2015   3) Vit D deficiency- maintained on otc vit D 2000iu once daily.   4) osteoporosis- maintained on annual reclast infusions last infusion was 10/16.    Review of Systems  Respiratory: Negative for shortness of breath.   Cardiovascular: Negative for chest pain and leg swelling.  Musculoskeletal: Negative for myalgias.   Past Medical History  Diagnosis Date  . Colon polyp   . Hearing loss   . Osteoporosis   . Tobacco abuse   . Cancer [facility]) 2000    breast- left breast- s/p lumpectomy and radiation  . Hypertension   . Hyperlipidemia      Social History   Social History  . Marital Status: Widowed    Spouse Name: N/A  . Number of Children: 3  . Years of Education: N/A   Occupational History  . retired    Social History Main Topics  . Smoking status: Current Every Day Smoker -- 0.50 packs/day    Types: Cigarettes  . Smokeless tobacco: Never Used     Comment: 05-22-14  smokes daily  . Alcohol Use: 0.0 oz/week    0 Standard drinks or equivalent per week     Comment: Rare  . Drug Use: Not on file  . Sexual Activity: Not on file   Other Topics Concern  . Not on file   Social History Narrative   Widow/ widower   Regular exercise- yes   Originally from [place]          Past Surgical History  Procedure Laterality Date  . Breast biopsy  2000  . Surgery on ear drum      right  . Breast lumpectomy  2000    left  . Cataract extraction, bilateral Bilateral  July and August 2015  . Cholecystectomy      Family History  Problem Relation Age of Onset  . Alcohol abuse Mother   . Arthritis Mother   . Hypertension Mother 21  . Heart disease Mother     CHF  . Alcohol abuse Father   . Arthritis Father   . Hypertension Father 62  . Heart attack Father     Died of MI at age 44    Allergies  Allergen Reactions  . Bupropion     tremors  . Lisinopril     REACTION: cough    Current Outpatient Prescriptions on File Prior to Visit  Medication Sig Dispense Refill  . acetaminophen (TYLENOL) 325 MG tablet Take 650 mg by mouth every 6 (six) hours as needed.      Marland Kitchen aspirin 81 MG tablet Take 81 mg by mouth daily.    Marland Kitchen atorvastatin (LIPITOR) 40 MG tablet TAKE 1 TABLET EVERY DAY 90 tablet 1  . Calcium Carbonate-Vit D-Min (CALCIUM 1200) 1200-1000 MG-UNIT CHEW Chew 1 tablet by mouth daily.     . Ergocalciferol (VITAMIN D2) 2000 UNITS TABS Take 1  tablet by mouth daily.      . hydrochlorothiazide (HYDRODIURIL) 25 MG tablet TAKE 1 TABLET EVERY DAY 90 tablet 1  . Multiple Vitamins-Minerals (PRESERVISION AREDS 2) CAPS Take 1 capsule by mouth daily.    Marland Kitchen omeprazole (PRILOSEC) 20 MG capsule TAKE 1 CAPSULE TWICE DAILY 180 capsule 0  . zoledronic acid (RECLAST) 5 MG/100ML SOLN Inject 5 mg into the vein. Annual infusion     No current facility-administered medications on file prior to visit.    BP 128/70 mmHg  Pulse 68  Temp(Src) 98.1 F (36.7 C) (Oral)  Resp 16  Ht 5' (1.524 m)  Wt 151 lb (68.493 kg)  BMI 29.49 kg/m2  SpO2 100%       Objective:   Physical Exam  Constitutional: She is oriented to person, place, and time. She appears well-developed and well-nourished.  HENT:  Head: Normocephalic and atraumatic.  Cardiovascular: Normal rate, regular rhythm and normal heart sounds.   No murmur heard. Pulmonary/Chest: Effort normal and breath sounds normal. No respiratory distress. She has no wheezes.  Musculoskeletal: She exhibits no edema.    Neurological: She is alert and oriented to person, place, and time.  Psychiatric: She has a normal mood and affect. Her behavior is normal. Judgment and thought content normal.          Assessment & Plan:

## 2015-12-13 NOTE — Assessment & Plan Note (Signed)
Repeat lipid panel, continue lisinopril.

## 2015-12-14 ENCOUNTER — Other Ambulatory Visit: Payer: Self-pay | Admitting: Family

## 2015-12-14 DIAGNOSIS — E559 Vitamin D deficiency, unspecified: Secondary | ICD-10-CM

## 2015-12-14 NOTE — Telephone Encounter (Signed)
Notified pt and she voices understanding. Lab appt scheduled for 03/20/16 at 8:30am. Future order entered.

## 2015-12-15 ENCOUNTER — Ambulatory Visit: Payer: Medicare Other | Admitting: Family

## 2015-12-15 ENCOUNTER — Telehealth: Payer: Self-pay | Admitting: *Deleted

## 2015-12-15 NOTE — Telephone Encounter (Signed)
Received call from pt stating [facility] called her yesterday stating we sent them an Rx for 30 day supply only and wanted to know if she wanted to get it from her local pharmacy or have them send [place] a request for a new Rx. Called [facility] and spoke with Wells Guiles, verified that Rx we sent was for #12 capsules and will be 90 day supply per directions of take 1 capsule weekly.  She apologized for the confusion and also states that Rx is excluded on Part D medicare and pt had previously agreed to pay out of pocket for Rx. They still need to verify with pt before shipping medication and they will contact her.

## 2016-03-09 ENCOUNTER — Telehealth: Payer: Self-pay | Admitting: Hematology & Oncology

## 2016-03-09 NOTE — Telephone Encounter (Signed)
Patient walked into office and ccx 05/19/16 apt and resch for 05/31/16

## 2016-03-20 ENCOUNTER — Other Ambulatory Visit (INDEPENDENT_AMBULATORY_CARE_PROVIDER_SITE_OTHER): Payer: Medicare Other

## 2016-03-20 ENCOUNTER — Other Ambulatory Visit: Payer: Self-pay | Admitting: Family

## 2016-03-20 DIAGNOSIS — E559 Vitamin D deficiency, unspecified: Secondary | ICD-10-CM

## 2016-03-20 LAB — VITAMIN D 25 HYDROXY (VIT D DEFICIENCY, FRACTURES): VITD: 39.8 ng/mL (ref 30.00–100.00)

## 2016-03-20 MED ORDER — VITAMIN D3 75 MCG (3000 UT) PO TABS: 1.0000 | ORAL_TABLET | Freq: Every day | ORAL | Status: AC

## 2016-03-20 NOTE — Progress Notes (Signed)
Notified pt and she voices understanding. 

## 2016-03-20 NOTE — Progress Notes (Signed)
Vit D looks good. D/c weekly vit D, begin otc vit D 3000 units daily.

## 2016-03-28 ENCOUNTER — Ambulatory Visit (HOSPITAL_BASED_OUTPATIENT_CLINIC_OR_DEPARTMENT_OTHER)
Admission: RE | Admit: 2016-03-28 | Discharge: 2016-03-28 | Disposition: A | Discharge status: Home / Self Care | Payer: Medicare Other | Source: Ambulatory Visit | Attending: Family | Admitting: Family

## 2016-03-28 DIAGNOSIS — Z1231 Encounter for screening mammogram for malignant neoplasm of breast: Secondary | ICD-10-CM | POA: Diagnosis not present

## 2016-04-15 ENCOUNTER — Other Ambulatory Visit: Payer: Self-pay | Admitting: Family

## 2016-05-19 ENCOUNTER — Ambulatory Visit: Payer: Medicare Other

## 2016-05-19 ENCOUNTER — Ambulatory Visit: Payer: Medicare Other | Admitting: Hematology & Oncology

## 2016-05-19 ENCOUNTER — Other Ambulatory Visit: Payer: Medicare Other

## 2016-05-30 ENCOUNTER — Other Ambulatory Visit: Payer: Self-pay

## 2016-05-30 DIAGNOSIS — Z853 Personal history of malignant neoplasm of breast: Secondary | ICD-10-CM

## 2016-05-31 ENCOUNTER — Ambulatory Visit (HOSPITAL_BASED_OUTPATIENT_CLINIC_OR_DEPARTMENT_OTHER): Payer: Medicare Other | Admitting: Hematology & Oncology

## 2016-05-31 ENCOUNTER — Ambulatory Visit (HOSPITAL_BASED_OUTPATIENT_CLINIC_OR_DEPARTMENT_OTHER): Payer: Medicare Other

## 2016-05-31 ENCOUNTER — Encounter: Payer: Self-pay | Admitting: Family

## 2016-05-31 ENCOUNTER — Other Ambulatory Visit (HOSPITAL_BASED_OUTPATIENT_CLINIC_OR_DEPARTMENT_OTHER): Payer: Medicare Other

## 2016-05-31 ENCOUNTER — Ambulatory Visit (INDEPENDENT_AMBULATORY_CARE_PROVIDER_SITE_OTHER): Payer: Medicare Other | Admitting: Family

## 2016-05-31 ENCOUNTER — Other Ambulatory Visit: Payer: Self-pay | Admitting: Family

## 2016-05-31 VITALS — BP 125/63 | HR 72 | Temp 97.9°F | Resp 20 | Ht 60.0 in | Wt 144.1 lb

## 2016-05-31 VITALS — BP 135/59 | HR 80 | Temp 97.9°F | Resp 16 | Ht 60.0 in | Wt 145.2 lb

## 2016-05-31 DIAGNOSIS — Z86000 Personal history of in-situ neoplasm of breast: Secondary | ICD-10-CM | POA: Diagnosis not present

## 2016-05-31 DIAGNOSIS — E559 Vitamin D deficiency, unspecified: Secondary | ICD-10-CM

## 2016-05-31 DIAGNOSIS — M810 Age-related osteoporosis without current pathological fracture: Secondary | ICD-10-CM

## 2016-05-31 DIAGNOSIS — E785 Hyperlipidemia, unspecified: Secondary | ICD-10-CM

## 2016-05-31 DIAGNOSIS — K219 Gastro-esophageal reflux disease without esophagitis: Secondary | ICD-10-CM | POA: Diagnosis not present

## 2016-05-31 DIAGNOSIS — Z23 Encounter for immunization: Secondary | ICD-10-CM

## 2016-05-31 DIAGNOSIS — Z853 Personal history of malignant neoplasm of breast: Secondary | ICD-10-CM

## 2016-05-31 DIAGNOSIS — Z720 Tobacco use: Secondary | ICD-10-CM | POA: Diagnosis not present

## 2016-05-31 DIAGNOSIS — E876 Hypokalemia: Secondary | ICD-10-CM

## 2016-05-31 DIAGNOSIS — I10 Essential (primary) hypertension: Secondary | ICD-10-CM

## 2016-05-31 DIAGNOSIS — D0512 Intraductal carcinoma in situ of left breast: Secondary | ICD-10-CM

## 2016-05-31 DIAGNOSIS — J069 Acute upper respiratory infection, unspecified: Secondary | ICD-10-CM

## 2016-05-31 LAB — VITAMIN D 25 HYDROXY (VIT D DEFICIENCY, FRACTURES): VITD: 36.51 ng/mL (ref 30.00–100.00)

## 2016-05-31 LAB — BASIC METABOLIC PANEL
BUN: 22 mg/dL (ref 6–23)
CALCIUM: 9.4 mg/dL (ref 8.4–10.5)
CHLORIDE: 103 meq/L (ref 96–112)
CO2: 31 mEq/L (ref 19–32)
CREATININE: 0.99 mg/dL (ref 0.40–1.20)
GFR: 57.83 mL/min — AB (ref >60.00)
Glucose, Bld: 85 mg/dL (ref 70–99)
Potassium: 3.4 mEq/L — ABNORMAL LOW (ref 3.5–5.1)
Sodium: 142 mEq/L (ref 135–145)

## 2016-05-31 LAB — CMP (CANCER CENTER ONLY)
ALK PHOS: 64 U/L (ref 26–84)
ALT(SGPT): 19 U/L (ref 10–47)
AST: 16 U/L (ref 11–38)
Albumin: 4.2 g/dL (ref 3.3–5.5)
BUN: 20 mg/dL (ref 7–22)
CALCIUM: 9 mg/dL (ref 8.0–10.3)
CHLORIDE: 103 meq/L (ref 98–108)
CO2: 28 mEq/L (ref 18–33)
Creat: 0.9 mg/dl (ref 0.6–1.2)
Glucose, Bld: 98 mg/dL (ref 73–118)
POTASSIUM: 3.6 meq/L (ref 3.3–4.7)
Sodium: 142 mEq/L (ref 128–145)
TOTAL PROTEIN: 7.2 g/dL (ref 6.4–8.1)
Total Bilirubin: 0.8 mg/dl (ref 0.20–1.60)

## 2016-05-31 LAB — CBC WITH DIFFERENTIAL (CANCER CENTER ONLY)
BASO#: 0 10*3/uL (ref 0.0–0.2)
BASO%: 0.4 % (ref 0.0–2.0)
EOS ABS: 0 10*3/uL (ref 0.0–0.5)
EOS%: 0.4 % (ref 0.0–7.0)
HCT: 41.7 % (ref 34.8–46.6)
HGB: 14.5 g/dL (ref 11.6–15.9)
LYMPH#: 1.8 10*3/uL (ref 0.9–3.3)
LYMPH%: 26.1 % (ref 14.0–48.0)
MCH: 32.1 pg (ref 26.0–34.0)
MCHC: 34.8 g/dL (ref 32.0–36.0)
MCV: 92 fL (ref 81–101)
MONO#: 0.6 10*3/uL (ref 0.1–0.9)
MONO%: 8.4 % (ref 0.0–13.0)
NEUT#: 4.4 10*3/uL (ref 1.5–6.5)
NEUT%: 64.7 % (ref 39.6–80.0)
PLATELETS: 217 10*3/uL (ref 145–400)
RBC: 4.52 10*6/uL (ref 3.70–5.32)
RDW: 13 % (ref 11.1–15.7)
WBC: 6.8 10*3/uL (ref 3.9–10.0)

## 2016-05-31 MED ORDER — OMEPRAZOLE 20 MG PO CPDR: 20.0000 mg | DELAYED_RELEASE_CAPSULE | Freq: Every day | ORAL | 1 refills | Status: DC

## 2016-05-31 MED ORDER — SODIUM CHLORIDE 0.9 % IV SOLN
Freq: Once | INTRAVENOUS | Status: AC
  Administered 2016-05-31: 12:00:00 via INTRAVENOUS

## 2016-05-31 MED ORDER — ZOLEDRONIC ACID 4 MG/100ML IV SOLN
4.0000 mg | Freq: Once | INTRAVENOUS | Status: AC
  Administered 2016-05-31: 4 mg via INTRAVENOUS
  Filled 2016-05-31: qty 100

## 2016-05-31 NOTE — Assessment & Plan Note (Signed)
BP stable on hctz. Continue same.  Obtain follow up bmet.

## 2016-05-31 NOTE — Progress Notes (Addendum)
Subjective:    Patient ID: Judith Flynn, female    DOB: 1941-01-08, 75 y.o.   MRN: [id number]  HPI  Judith Flynn is a 75 yr old female who presents today for follow up.   HTN-  Currently maintained on hctz once daily. Denies SOB, swelling, CP.  BP Readings from Last 3 Encounters:  05/31/16 (!) 135/59  12/13/15 128/70  05/21/15 133/67   Hyperlipidemia- denies myalgia, reports good compliance with statin.  Lab Results  Component Value Date   CHOL 192 12/13/2015   HDL 49.70 12/13/2015   LDLCALC 117 (H) 12/13/2015   LDLDIRECT 100.0 02/19/2015   TRIG 126.0 12/13/2015   CHOLHDL 4 12/13/2015   GERD- maintained on omeprazole. She is only taking one tablet once daily.   Has reclast infusion schedule today with Dr. Marin Olp.   Reports mild URI symptoms- cough, nasal congestion.  Sister and multiple family members were sick.   Review of Systems See HPI  Past Medical History:  Diagnosis Date  . Cancer [facility]) 2000   breast- left breast- s/p lumpectomy and radiation  . Colon polyp   . Hearing loss   . Hyperlipidemia   . Hypertension   . Osteoporosis   . Tobacco abuse      Social History   Social History  . Marital status: Widowed    Spouse name: N/A  . Number of children: 3  . Years of education: N/A   Occupational History  . retired Retired   Social History Main Topics  . Smoking status: Current Every Day Smoker    Packs/day: 0.50    Types: Cigarettes  . Smokeless tobacco: Never Used     Comment: 05-22-14  smokes daily  . Alcohol use 0.0 oz/week     Comment: Rare  . Drug use: Unknown  . Sexual activity: Not on file   Other Topics Concern  . Not on file   Social History Narrative   Widow/ widower   Regular exercise- yes   Originally from [place]          Past Surgical History:  Procedure Laterality Date  . BREAST BIOPSY  2000  . BREAST LUMPECTOMY  2000   left  . CATARACT EXTRACTION, BILATERAL Bilateral July and August 2015  . CHOLECYSTECTOMY      . surgery on ear drum     right    Family History  Problem Relation Age of Onset  . Alcohol abuse Mother   . Arthritis Mother   . Hypertension Mother 72  . Heart disease Mother     CHF  . Alcohol abuse Father   . Arthritis Father   . Hypertension Father 34  . Heart attack Father     Died of MI at age 57    Allergies  Allergen Reactions  . Bupropion     tremors  . Lisinopril     REACTION: cough    Current Outpatient Prescriptions on File Prior to Visit  Medication Sig Dispense Refill  . acetaminophen (TYLENOL) 325 MG tablet Take 650 mg by mouth every 6 (six) hours as needed.      Marland Kitchen aspirin 81 MG tablet Take 81 mg by mouth daily.    Marland Kitchen atorvastatin (LIPITOR) 40 MG tablet Take 1 tablet by mouth  daily 90 tablet 1  . Calcium Carbonate-Vit D-Min (CALCIUM 1200) 1200-1000 MG-UNIT CHEW Chew 1 tablet by mouth daily.     . Cholecalciferol (VITAMIN D3) 3000 units TABS Take 1 tablet by mouth daily.  30 tablet   . hydrochlorothiazide (HYDRODIURIL) 25 MG tablet Take 1 tablet by mouth  daily 90 tablet 1  . Multiple Vitamins-Minerals (PRESERVISION AREDS 2) CAPS Take 1 capsule by mouth daily.    . zoledronic acid (RECLAST) 5 MG/100ML SOLN Inject 5 mg into the vein. Annual infusion     No current facility-administered medications on file prior to visit.     BP (!) 135/59 (BP Location: Right Arm, Patient Position: Sitting, Cuff Size: Normal)   Pulse 80   Temp 97.9 F (36.6 C) (Oral)   Resp 16   Ht 5' (1.524 m)   Wt 145 lb 3.2 oz (65.9 kg)   SpO2 100% Comment: RA  BMI 28.36 kg/m       Objective:   Physical Exam  Constitutional: She is oriented to person, place, and time. She appears well-developed and well-nourished.  HENT:  Head: Normocephalic and atraumatic.  Cardiovascular: Normal rate, regular rhythm and normal heart sounds.   No murmur heard. Pulmonary/Chest: Effort normal. No respiratory distress.  Soft expiratory wheeze.    Neurological: She is alert and oriented to  person, place, and time.  Skin: Skin is warm and dry.  Psychiatric: She has a normal mood and affect. Her behavior is normal. Judgment and thought content normal.          Assessment & Plan:  URI- mild wheezing, offered albuterol MDI, pt declines.  Advised pt to call if symptoms worsen or do not improve.

## 2016-05-31 NOTE — Telephone Encounter (Signed)
K+ is low.  I would like her to add kdur once daily, follow up in 1 week for follow up bmet.

## 2016-05-31 NOTE — Progress Notes (Signed)
Pre visit review using our clinic review tool, if applicable. No additional management support is needed unless otherwise documented below in the visit note. 

## 2016-05-31 NOTE — Assessment & Plan Note (Signed)
Stable on statin.  Continuing same.  Lab Results  Component Value Date   CHOL 192 12/13/2015   HDL 49.70 12/13/2015   LDLCALC 117 (H) 12/13/2015   LDLDIRECT 100.0 02/19/2015   TRIG 126.0 12/13/2015   CHOLHDL 4 12/13/2015

## 2016-05-31 NOTE — Addendum Note (Signed)
Addended by: Burney Gauze R on: 05/31/2016 11:27 AM   Modules accepted: Orders

## 2016-05-31 NOTE — Progress Notes (Signed)
Hematology and Oncology Follow Up Visit  Judith Flynn [id number] [birth year] 75 y.o. 05/31/2016   Principle Diagnosis:  1. Ductal carcinoma in situ of the left breast 2. Osteoporosis  Current Therapy:   Reclast 5 mg IV q yr    Interim History:  Judith Flynn is here today for her annual follow-up. She is doing well and has no complaints at this time.   She had her mammogram in August which was negative.   She continues to do self breast exams at home. Her exam today was negative. No changes were noted.  She is still smoking. She smokes about half a pack per day.  She isn't back up to [place] in a weekend or so to pick up a family member to come back for Thanksgiving. She and her family having gone up to [place] and then down to [place] each summer. They really enjoy their family time together.   She has had no problems with cough or shortness of breath. She is not sure when she had her last chest x-ray. I'm sure Dr. Inda Castle is monitoring this.  She is taking her vitamin D.  Overall, her performance status is ECOG 0.  Medications:    Medication List       Accurate as of 05/31/16 11:00 AM. Always use your most recent med list.          acetaminophen 325 MG tablet Commonly known as:  TYLENOL Take 650 mg by mouth every 6 (six) hours as needed.   aspirin 81 MG tablet Take 81 mg by mouth daily.   atorvastatin 40 MG tablet Commonly known as:  LIPITOR Take 1 tablet by mouth  daily   CALCIUM 1200 1200-1000 MG-UNIT Chew Chew 1 tablet by mouth daily.   hydrochlorothiazide 25 MG tablet Commonly known as:  HYDRODIURIL Take 1 tablet by mouth  daily   omeprazole 20 MG capsule Commonly known as:  PRILOSEC Take 1 capsule (20 mg total) by mouth daily.   PRESERVISION AREDS 2 Caps Take 1 capsule by mouth daily.   RECLAST 5 MG/100ML Soln injection Generic drug:  zoledronic acid Inject 5 mg into the vein. Annual infusion   Vitamin D3 3000 units Tabs Take 1 tablet by  mouth daily.       Allergies:  Allergies  Allergen Reactions  . Bupropion     tremors  . Lisinopril     REACTION: cough    Past Medical History, Surgical history, Social history, and Family History were reviewed and updated.  Review of Systems: All other 10 point review of systems is negative.   Physical Exam:  height is 5' (1.524 m) and weight is 144 lb 1.9 oz (65.4 kg). Her oral temperature is 97.9 F (36.6 C). Her blood pressure is 125/63 and her pulse is 72. Her respiration is 20.   Wt Readings from Last 3 Encounters:  05/31/16 144 lb 1.9 oz (65.4 kg)  05/31/16 145 lb 3.2 oz (65.9 kg)  12/13/15 151 lb (68.5 kg)    Ocular: Sclerae unicteric, pupils equal, round and reactive to light Ear-nose-throat: Oropharynx clear, dentition fair Lymphatic: No cervical or supraclavicular adenopathy Lungs no rales or rhonchi, good excursion bilaterally Heart regular rate and rhythm, no murmur appreciated Abd soft, nontender, positive bowel sounds MSK no focal spinal tenderness, no joint edema Neuro: non-focal, well-oriented, appropriate affect Breasts: No changes. Lumpectomy scar on left breast is intact. No mass, lesion, rash or lymphadenopathy found on exam. She continues to do self breast  exams at home.   Lab Results  Component Value Date   WBC 6.8 05/31/2016   HGB 14.5 05/31/2016   HCT 41.7 05/31/2016   MCV 92 05/31/2016   PLT 217 05/31/2016   No results found for: FERRITIN, IRON, TIBC, UIBC, IRONPCTSAT Lab Results  Component Value Date   RBC 4.52 05/31/2016   No results found for: KPAFRELGTCHN, LAMBDASER, KAPLAMBRATIO No results found for: IGGSERUM, IGA, IGMSERUM No results found for: Odetta Pink, SPEI   Chemistry      Component Value Date/Time   NA 142 05/31/2016 0936   K 3.6 05/31/2016 0936   CL 103 05/31/2016 0936   CO2 28 05/31/2016 0936   BUN 20 05/31/2016 0936   CREATININE 0.9 05/31/2016 0936        Component Value Date/Time   CALCIUM 9.0 05/31/2016 0936   ALKPHOS 64 05/31/2016 0936   AST 16 05/31/2016 0936   ALT 19 05/31/2016 0936   BILITOT 0.80 05/31/2016 0936      Impression and Plan: Judith Flynn isa pleasant 75 yo white female with history of ductal carcinoma in situ of the left breast. She was diagnosed over 17 years ago and had a lumpectomy at that time. She was the treated with radiation and completed 5 years of tamoxifen for 5 years in 2006.   We will go ahead and give her Reclast today.  I'm just happy that she is doing so well. From my point of view, I think her biggest risk of cancer will be from tobacco use. I told her that. She really does not drink so the latest studies that have connected alcohol to cancer hopefully will not apply to her.   We will plan to get her back in one year.    Volanda Napoleon, MD 11/8/201711:00 AM

## 2016-05-31 NOTE — Patient Instructions (Signed)

## 2016-05-31 NOTE — Patient Instructions (Addendum)
Please complete lab work prior to leaving.  Call if your cold symptoms worsen or do not improve in the next 3 days.  Follow up in 6 months

## 2016-05-31 NOTE — Assessment & Plan Note (Signed)
Obtain follow up vit D level.  

## 2016-05-31 NOTE — Assessment & Plan Note (Signed)
Receiving annual reclast per hematology.

## 2016-05-31 NOTE — Assessment & Plan Note (Signed)
Stable on once daily omeprazole 20mg . Continue same.

## 2016-06-05 NOTE — Telephone Encounter (Signed)
Noted that repeat bmet K+ was normal. Will hold off on Kdur rx.

## 2016-06-14 ENCOUNTER — Ambulatory Visit: Payer: Medicare Other | Admitting: Family

## 2016-07-03 NOTE — Progress Notes (Signed)
Subjective:   Judith Flynn is a 75 y.o. female who presents for Medicare Annual (Subsequent) preventive examination.  Review of Systems:  No ROS.  Medicare Wellness Visit.  Cardiac Risk Factors include: advanced age (>38men, >7 women);dyslipidemia;hypertension;smoking/ tobacco exposure Sleep patterns: No sleeping issues. Normally sleeps 7 hrs per night. Feels well rested. Home Safety/Smoke Alarms:  Smoke detectors and carbon monoxide detectors. Living environment; residence and Firearm Safety: Lives with daughter. Lives in 2 story house. No problems. No guns. Feels safe.  Seat Belt Safety/Bike Helmet: Wears seatbelt.   Counseling:   Eye Exam- Wears glasses just for reading. Bifocals. [employer]- No regular dentist.  Female:   Pap- Pt states she was told she no longer needs pap screening.    Mammo-Last on 03/29/16:  BI-RADS CATEGORY  1: Negative. F/u screening in 1 year per report.      Dexa scan- Last on 03/15/15: Osteoporosis. F/u not on file.   CCS- Last on 08/05/2007- reported normal     Objective:     Vitals: BP 134/62 (BP Location: Right Arm, Patient Position: Sitting, Cuff Size: Normal)   Pulse 82   Ht 5\' 1"  (1.549 m)   Wt 145 lb 3.2 oz (65.9 kg)   SpO2 98%   BMI 27.44 kg/m   Body mass index is 27.44 kg/m.   Tobacco History  Smoking Status  . Current Every Day Smoker  . Packs/day: 0.50  . Types: Cigarettes  Smokeless Tobacco  . Never Used    Comment: 05-22-14  smokes daily     Ready to quit: No Counseling given: Yes   Past Medical History:  Diagnosis Date  . Cancer [facility]) 2000   breast- left breast- s/p lumpectomy and radiation  . Colon polyp   . Hearing loss   . Hyperlipidemia   . Hypertension   . Osteoporosis   . Tobacco abuse    Past Surgical History:  Procedure Laterality Date  . BREAST BIOPSY  2000  . BREAST LUMPECTOMY  2000   left  . CATARACT EXTRACTION, BILATERAL Bilateral July and August 2015  . CHOLECYSTECTOMY    .  surgery on ear drum     right   Family History  Problem Relation Age of Onset  . Alcohol abuse Mother   . Arthritis Mother   . Hypertension Mother 42  . Heart disease Mother     CHF  . Alcohol abuse Father   . Arthritis Father   . Hypertension Father 7  . Heart attack Father     Died of MI at age 35   History  Sexual Activity  . Sexual activity: Yes    Outpatient Encounter Prescriptions as of 07/04/2016  Medication Sig  . acetaminophen (TYLENOL) 325 MG tablet Take 650 mg by mouth every 6 (six) hours as needed.    Marland Kitchen aspirin 81 MG tablet Take 81 mg by mouth daily.  Marland Kitchen atorvastatin (LIPITOR) 40 MG tablet Take 1 tablet by mouth  daily  . Calcium Carbonate-Vit D-Min (CALCIUM 1200) 1200-1000 MG-UNIT CHEW Chew 1 tablet by mouth daily.   . Cholecalciferol (VITAMIN D3) 3000 units TABS Take 1 tablet by mouth daily.  . hydrochlorothiazide (HYDRODIURIL) 25 MG tablet Take 1 tablet by mouth  daily  . Multiple Vitamins-Minerals (PRESERVISION AREDS 2) CAPS Take 1 capsule by mouth daily.  Marland Kitchen omeprazole (PRILOSEC) 20 MG capsule Take 1 capsule (20 mg total) by mouth daily.  . zoledronic acid (RECLAST) 5 MG/100ML SOLN Inject 5 mg  into the vein. Annual infusion   No facility-administered encounter medications on file as of 07/04/2016.     Activities of Daily Living In your present state of health, do you have any difficulty performing the following activities: 07/04/2016  Hearing? Y  Vision? N  Difficulty concentrating or making decisions? N  Walking or climbing stairs? N  Dressing or bathing? N  Doing errands, shopping? N  Preparing Food and eating ? N  Using the Toilet? N  In the past six months, have you accidently leaked urine? (No Data)  Do you have problems with loss of bowel control? N  Managing your Medications? N  Managing your Finances? N  Housekeeping or managing your Housekeeping? N  Some recent data might be hidden    Patient Care Team: Debbrah Alar, NP as PCP -  General Volanda Napoleon, MD as Attending Physician (Hematology and Oncology)    Assessment:    Physical assessment deferred to PCP.  Exercise Activities and Dietary recommendations Current Exercise Habits: Home exercise routine, Type of exercise: walking, Time (Minutes): 30, Frequency (Times/Week): 3, Weekly Exercise (Minutes/Week): 90, Intensity: Mild, Exercise limited by: None identified   Diet: Pt reports only eating about 2 meals per day with a snack.  Report eating, fruits, vegetables, lean meats. Only drinks 1 glass of water per day.    Goals    . Increase water intake    . Weight (lb) < 135 lb (61.2 kg)      Fall Risk Fall Risk  07/04/2016 05/31/2016 12/13/2015 08/05/2014 05/22/2014  Falls in the past year? No No Yes No No  Number falls in past yr: - - 1 - -  Injury with Fall? - - No - -   Depression Screen PHQ 2/9 Scores 07/04/2016 12/13/2015 08/05/2014 06/10/2013  PHQ - 2 Score 0 0 0 0     Cognitive Function MMSE - Mini Mental State Exam 07/04/2016  Orientation to time 5  Orientation to Place 5  Registration 3  Attention/ Calculation 5  Recall 2  Language- name 2 objects 2  Language- repeat 1  Language- follow 3 step command 3  Language- read & follow direction 1  Write a sentence 1  Copy design 1  Total score 29        Immunization History  Administered Date(s) Administered  . Influenza Split 04/19/2011, 05/01/2012  . Influenza Whole 05/03/2009, 06/03/2009, 05/02/2010  . Influenza, High Dose Seasonal PF 06/10/2013, 05/05/2015, 05/31/2016  . Influenza,inj,Quad PF,36+ Mos 05/22/2014  . Pneumococcal Conjugate-13 02/10/2014  . Pneumococcal Polysaccharide-23 05/07/2006, 06/03/2009  . Td 06/03/2009   Screening Tests Health Maintenance  Topic Date Due  . ZOSTAVAX  07/04/2016 (Originally 07/22/1999)  . MAMMOGRAM  03/28/2017  . TETANUS/TDAP  06/04/2019  . INFLUENZA VACCINE  Completed  . DEXA SCAN  Completed  . PNA vac Low Risk Adult  Completed      Plan:      Follow up with Melissa O'Sullivan,NP as scheduled 11/29/16.  Bring a copy of your advance directives to your next office visit. Continue to eat heart healthy diet (full of fruits, vegetables, whole grains, lean protein, water--limit salt, fat, and sugar intake) and increase physical activity as tolerated. Review smoking cessation information and let me know if I can be of any assistance. Increase water intake. (may use grape or orange flavoring)  During the course of the visit the patient was educated and counseled about the following appropriate screening and preventive services:   Vaccines to include Pneumoccal,  Influenza, Hepatitis B, Td, Zostavax, HCV  Cardiovascular Disease  Colorectal cancer screening  Bone density screening  Diabetes screening  Glaucoma screening  Mammography/PAP  Nutrition counseling   Patient Instructions (the written plan) was given to the patient.   Shela Nevin, [place]  07/04/2016

## 2016-07-03 NOTE — Progress Notes (Signed)
Pre visit review using our clinic review tool, if applicable. No additional management support is needed unless otherwise documented below in the visit note. 

## 2016-07-04 ENCOUNTER — Ambulatory Visit (INDEPENDENT_AMBULATORY_CARE_PROVIDER_SITE_OTHER): Payer: Medicare Other | Admitting: *Deleted

## 2016-07-04 ENCOUNTER — Encounter: Payer: Self-pay | Admitting: *Deleted

## 2016-07-04 VITALS — BP 134/62 | HR 82 | Ht 61.0 in | Wt 145.2 lb

## 2016-07-04 DIAGNOSIS — Z0000 Encounter for general adult medical examination without abnormal findings: Secondary | ICD-10-CM

## 2016-07-04 NOTE — Progress Notes (Signed)
Noted and agree. 

## 2016-07-04 NOTE — Patient Instructions (Addendum)
Follow up with Melissa O'Sullivan,NP as scheduled 11/29/16.  Bring a copy of your advance directives to your next office visit. Continue to eat heart healthy diet (full of fruits, vegetables, whole grains, lean protein, water--limit salt, fat, and sugar intake) and increase physical activity as tolerated. Review smoking cessation information and let me know if I can be of any assistance. Increase water intake. (may use grape or orange flavoring)

## 2016-11-29 ENCOUNTER — Ambulatory Visit: Payer: Medicare Other | Admitting: Family

## 2017-02-19 ENCOUNTER — Other Ambulatory Visit: Payer: Self-pay | Admitting: Hematology & Oncology

## 2017-02-19 DIAGNOSIS — Z1231 Encounter for screening mammogram for malignant neoplasm of breast: Secondary | ICD-10-CM

## 2017-03-29 ENCOUNTER — Encounter (HOSPITAL_BASED_OUTPATIENT_CLINIC_OR_DEPARTMENT_OTHER): Payer: Self-pay

## 2017-03-29 ENCOUNTER — Ambulatory Visit (HOSPITAL_BASED_OUTPATIENT_CLINIC_OR_DEPARTMENT_OTHER)
Admission: RE | Admit: 2017-03-29 | Discharge: 2017-03-29 | Disposition: A | Discharge status: Home / Self Care | Payer: Medicare Other | Source: Ambulatory Visit | Attending: Hematology & Oncology | Admitting: Hematology & Oncology

## 2017-03-29 DIAGNOSIS — Z1231 Encounter for screening mammogram for malignant neoplasm of breast: Secondary | ICD-10-CM | POA: Insufficient documentation

## 2017-03-29 IMAGING — MG DIGITAL SCREENING BILATERAL MAMMOGRAM WITH CAD
4 series · 4 of 4 positions shown · non-contrast
Comparison: Previous exam(s).

ACR Breast Density Category a: The breast tissue is almost entirely
fatty.

CLINICAL DATA: Screening.

EXAM:
DIGITAL SCREENING BILATERAL MAMMOGRAM WITH CAD

[R CC]
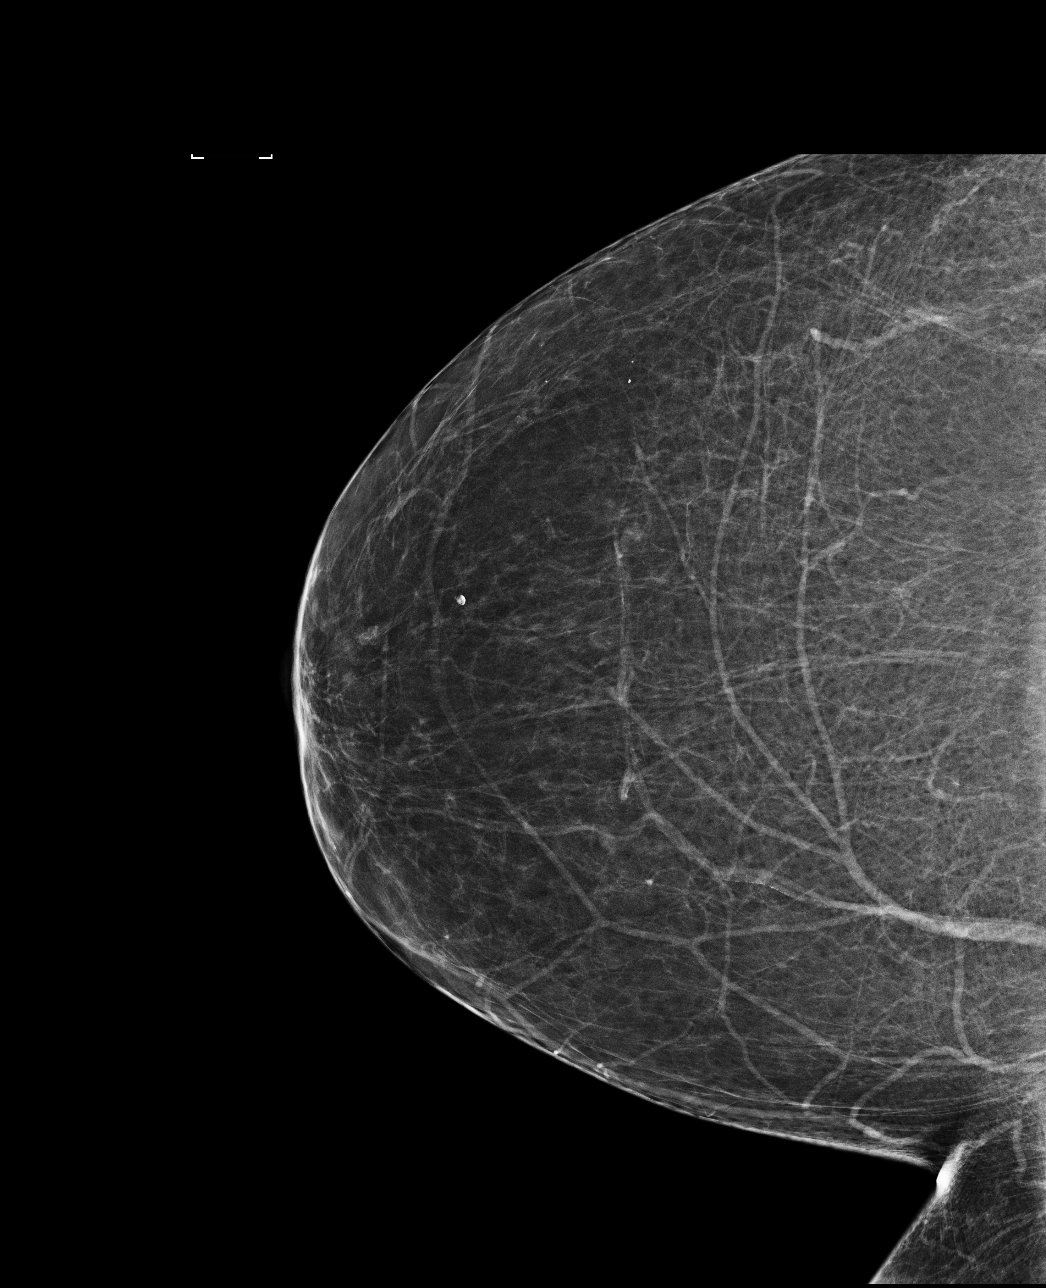

[R MLO]
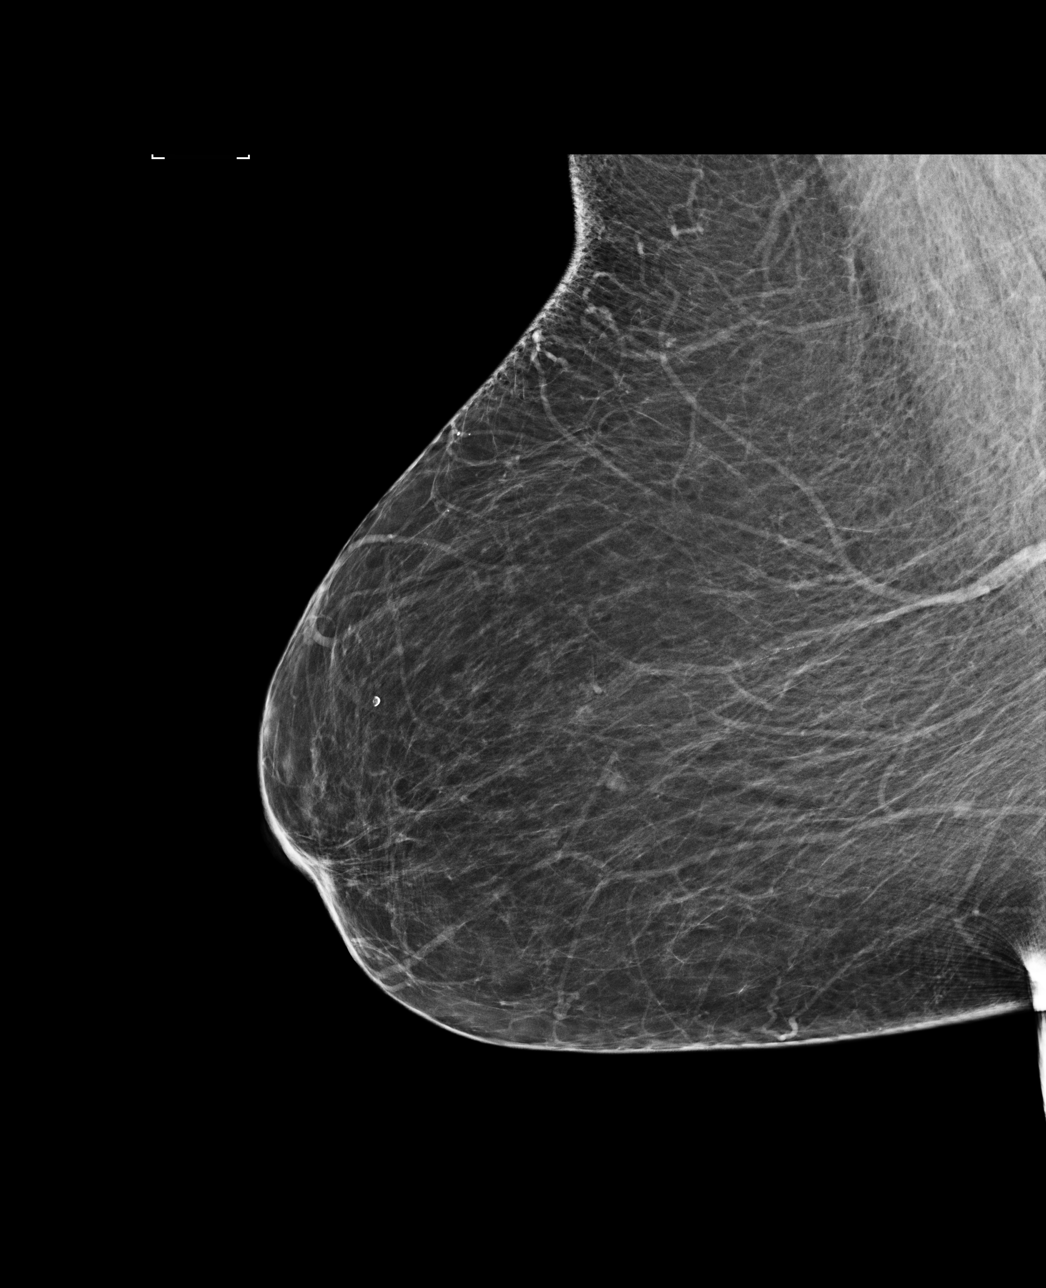

[L CC]
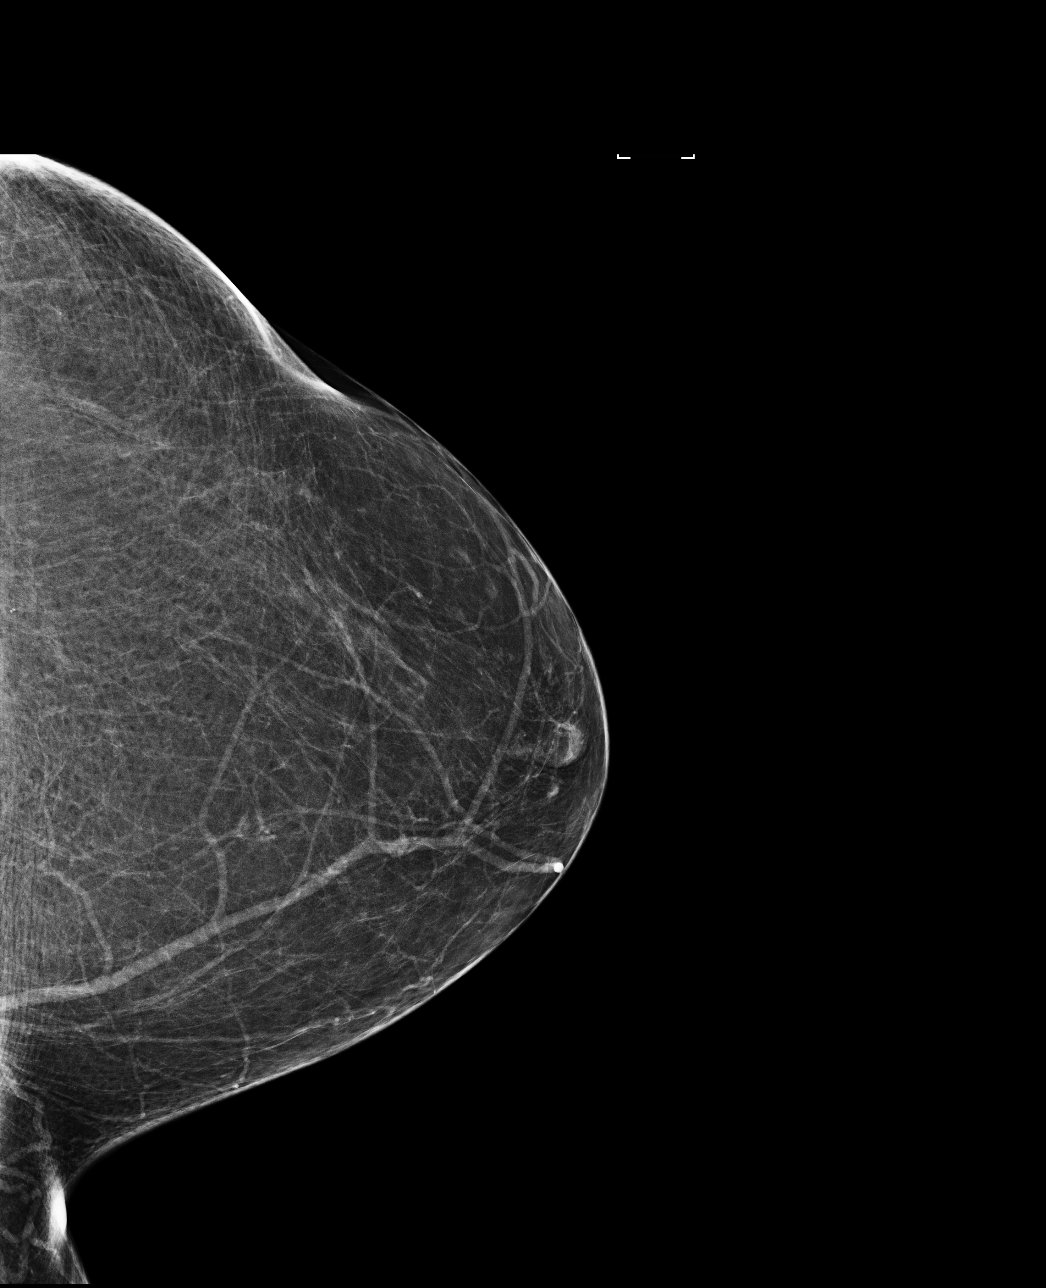

[L MLO]
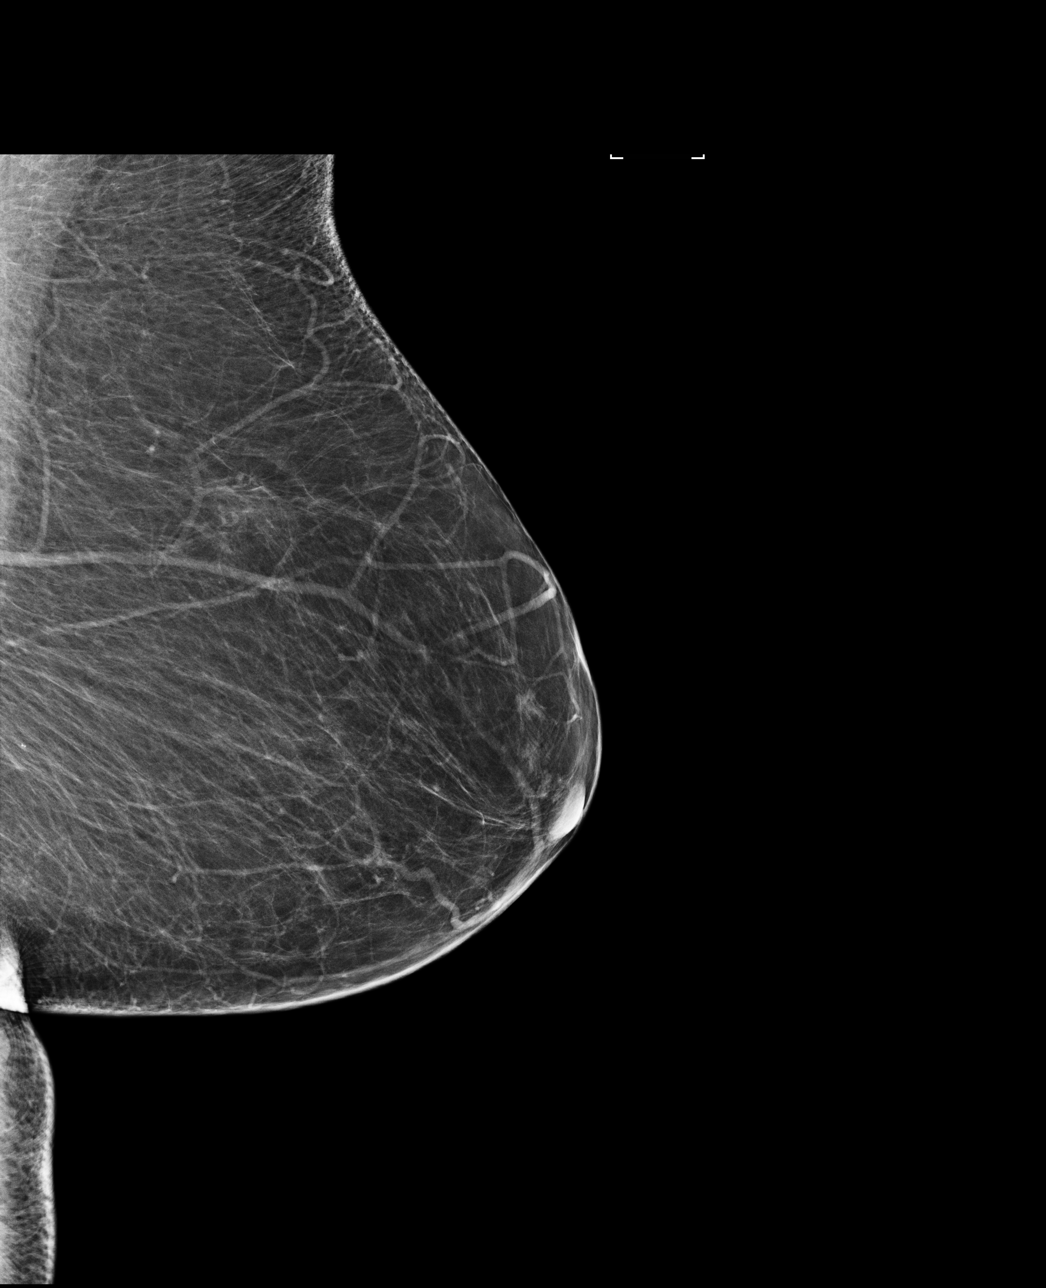

[4 of 4 positions shown; findings below may reference images not displayed]

FINDINGS: There are no findings suspicious for malignancy. Images were
processed with CAD.
IMPRESSION: No mammographic evidence of malignancy. A result letter of this
screening mammogram will be mailed directly to the patient.

RECOMMENDATION:
Screening mammogram in one year. (Code:[id number])

BI-RADS CATEGORY  1: Negative.

## 2017-05-05 ENCOUNTER — Other Ambulatory Visit: Payer: Self-pay | Admitting: Family

## 2017-05-07 NOTE — Telephone Encounter (Signed)
HCTZ, omeprazole and atorvastatin refill was sent to pharmacy. Pt was due for follow up in 11/2016 and is past due. Please call pt to schedule appt ASAP.  Thanks!

## 2017-05-07 NOTE — Telephone Encounter (Signed)
Judith Flynn (daughter is on [place]) informed and will inform patient, patient is currently in [place] and will not return to [place] until the end of November.

## 2017-06-26 ENCOUNTER — Other Ambulatory Visit: Payer: Self-pay | Admitting: *Deleted

## 2017-06-26 DIAGNOSIS — Z853 Personal history of malignant neoplasm of breast: Secondary | ICD-10-CM

## 2017-06-27 ENCOUNTER — Other Ambulatory Visit (HOSPITAL_BASED_OUTPATIENT_CLINIC_OR_DEPARTMENT_OTHER): Payer: Medicare Other

## 2017-06-27 ENCOUNTER — Ambulatory Visit (HOSPITAL_BASED_OUTPATIENT_CLINIC_OR_DEPARTMENT_OTHER): Payer: Medicare Other

## 2017-06-27 ENCOUNTER — Other Ambulatory Visit: Payer: Self-pay

## 2017-06-27 ENCOUNTER — Encounter: Payer: Self-pay | Admitting: Hematology & Oncology

## 2017-06-27 ENCOUNTER — Ambulatory Visit (HOSPITAL_BASED_OUTPATIENT_CLINIC_OR_DEPARTMENT_OTHER): Payer: Medicare Other | Admitting: Hematology & Oncology

## 2017-06-27 VITALS — BP 148/60 | HR 75 | Temp 98.2°F | Resp 20 | Wt 140.0 lb

## 2017-06-27 DIAGNOSIS — Z86000 Personal history of in-situ neoplasm of breast: Secondary | ICD-10-CM

## 2017-06-27 DIAGNOSIS — M8000XD Age-related osteoporosis with current pathological fracture, unspecified site, subsequent encounter for fracture with routine healing: Secondary | ICD-10-CM

## 2017-06-27 DIAGNOSIS — M810 Age-related osteoporosis without current pathological fracture: Secondary | ICD-10-CM | POA: Diagnosis not present

## 2017-06-27 DIAGNOSIS — Z853 Personal history of malignant neoplasm of breast: Secondary | ICD-10-CM

## 2017-06-27 DIAGNOSIS — Z720 Tobacco use: Secondary | ICD-10-CM

## 2017-06-27 LAB — CMP (CANCER CENTER ONLY)
ALBUMIN: 3.7 g/dL (ref 3.3–5.5)
ALT: 16 U/L (ref 10–47)
AST: 16 U/L (ref 11–38)
Alkaline Phosphatase: 68 U/L (ref 26–84)
BILIRUBIN TOTAL: 0.7 mg/dL (ref 0.20–1.60)
BUN, Bld: 11 mg/dL (ref 7–22)
CALCIUM: 9.2 mg/dL (ref 8.0–10.3)
CO2: 27 mEq/L (ref 18–33)
Chloride: 106 mEq/L (ref 98–108)
Creat: 0.8 mg/dl (ref 0.6–1.2)
GLUCOSE: 104 mg/dL (ref 73–118)
Potassium: 3.9 mEq/L (ref 3.3–4.7)
Sodium: 146 mEq/L — ABNORMAL HIGH (ref 128–145)
Total Protein: 6.4 g/dL (ref 6.4–8.1)

## 2017-06-27 LAB — CBC WITH DIFFERENTIAL (CANCER CENTER ONLY)
BASO#: 0 10*3/uL (ref 0.0–0.2)
BASO%: 0.5 % (ref 0.0–2.0)
EOS ABS: 0 10*3/uL (ref 0.0–0.5)
EOS%: 0.7 % (ref 0.0–7.0)
HCT: 40.3 % (ref 34.8–46.6)
HEMOGLOBIN: 13.6 g/dL (ref 11.6–15.9)
LYMPH#: 1.4 10*3/uL (ref 0.9–3.3)
LYMPH%: 24.7 % (ref 14.0–48.0)
MCH: 32.1 pg (ref 26.0–34.0)
MCHC: 33.7 g/dL (ref 32.0–36.0)
MCV: 95 fL (ref 81–101)
MONO#: 0.4 10*3/uL (ref 0.1–0.9)
MONO%: 7.3 % (ref 0.0–13.0)
NEUT#: 3.7 10*3/uL (ref 1.5–6.5)
NEUT%: 66.8 % (ref 39.6–80.0)
Platelets: 199 10*3/uL (ref 145–400)
RBC: 4.24 10*6/uL (ref 3.70–5.32)
RDW: 12.8 % (ref 11.1–15.7)
WBC: 5.5 10*3/uL (ref 3.9–10.0)

## 2017-06-27 MED ORDER — ZOLEDRONIC ACID 4 MG/100ML IV SOLN
4.0000 mg | Freq: Once | INTRAVENOUS | Status: AC
  Administered 2017-06-27: 4 mg via INTRAVENOUS
  Filled 2017-06-27: qty 100

## 2017-06-27 MED ORDER — SODIUM CHLORIDE 0.9 % IV SOLN
Freq: Once | INTRAVENOUS | Status: AC
  Administered 2017-06-27: 11:00:00 via INTRAVENOUS

## 2017-06-27 NOTE — Progress Notes (Signed)
Hematology and Oncology Follow Up Visit  Judith Flynn [id number] 02/08/1941 76 y.o. 06/27/2017   Principle Diagnosis:  1. Ductal carcinoma in situ of the left breast 2. Osteoporosis  Current Therapy:   Reclast 5 mg IV q yr    Interim History:  Judith Flynn is here today for her annual follow-up. She is doing well and has no complaints at this time.  As always, she and her family have been up and down the [employer].  She goes to [place].  She also goes to [place].  She has had no problems with nausea or vomiting.  She has had no problems with cough or shortness of breath.  She is still smoking.  She has had no issues with rashes.  She has had no fatigue.  She has had no fever.  There is no change in bowel or bladder habits.    Overall, her performance status is ECOG 0.   Medications:  Allergies as of 06/27/2017      Reactions   Bupropion    tremors   Lisinopril    REACTION: cough      Medication List        Accurate as of 06/27/17 10:41 AM. Always use your most recent med list.          acetaminophen 325 MG tablet Commonly known as:  TYLENOL Take 650 mg by mouth every 6 (six) hours as needed.   aspirin 81 MG tablet Take 81 mg by mouth daily.   atorvastatin 40 MG tablet Commonly known as:  LIPITOR TAKE 1 TABLET BY MOUTH  DAILY   CALCIUM 1200 1200-1000 MG-UNIT Chew Chew 1 tablet by mouth daily.   hydrochlorothiazide 25 MG tablet Commonly known as:  HYDRODIURIL TAKE 1 TABLET BY MOUTH  DAILY   omeprazole 20 MG capsule Commonly known as:  PRILOSEC TAKE 1 CAPSULE BY MOUTH TWO TIMES DAILY   PRESERVISION AREDS 2 Caps Take 1 capsule by mouth daily.   RECLAST 5 MG/100ML Soln injection Generic drug:  zoledronic acid Inject 5 mg into the vein. Annual infusion   Vitamin D3 3000 units Tabs Take 1 tablet by mouth daily.       Allergies:  Allergies  Allergen Reactions  . Bupropion     tremors  . Lisinopril     REACTION: cough    Past Medical  History, Surgical history, Social history, and Family History were reviewed and updated.  Review of Systems: As stated in the interim history  Physical Exam:  weight is 140 lb (63.5 kg). Her oral temperature is 98.2 F (36.8 C). Her blood pressure is 148/60 (abnormal) and her pulse is 75. Her respiration is 20 and oxygen saturation is 96%.   Wt Readings from Last 3 Encounters:  06/27/17 140 lb (63.5 kg)  07/04/16 145 lb 3.2 oz (65.9 kg)  05/31/16 144 lb 1.9 oz (65.4 kg)    Physical Exam  Constitutional: She is oriented to person, place, and time.  HENT:  Head: Normocephalic and atraumatic.  Mouth/Throat: Oropharynx is clear and moist.  Eyes: EOM are normal. Pupils are equal, round, and reactive to light.  Neck: Normal range of motion.  Cardiovascular: Normal rate, regular rhythm and normal heart sounds.  Pulmonary/Chest: Effort normal and breath sounds normal.  Abdominal: Soft. Bowel sounds are normal.  Musculoskeletal: Normal range of motion. She exhibits no edema, tenderness or deformity.  Lymphadenopathy:    She has no cervical adenopathy.  Neurological: She is alert and oriented to  person, place, and time.  Skin: Skin is warm and dry. No rash noted. No erythema.  Psychiatric: She has a normal mood and affect. Her behavior is normal. Judgment and thought content normal.  Vitals reviewed.  .   Lab Results  Component Value Date   WBC 5.5 06/27/2017   HGB 13.6 06/27/2017   HCT 40.3 06/27/2017   MCV 95 06/27/2017   PLT 199 06/27/2017   No results found for: FERRITIN, IRON, TIBC, UIBC, IRONPCTSAT Lab Results  Component Value Date   RBC 4.24 06/27/2017   No results found for: KPAFRELGTCHN, LAMBDASER, KAPLAMBRATIO No results found for: IGGSERUM, IGA, IGMSERUM No results found for: Odetta Pink, SPEI   Chemistry      Component Value Date/Time   NA 146 (H) 06/27/2017 0903   K 3.9 06/27/2017 0903   CL 106  06/27/2017 0903   CO2 27 06/27/2017 0903   BUN 11 06/27/2017 0903   CREATININE 0.8 06/27/2017 0903      Component Value Date/Time   CALCIUM 9.2 06/27/2017 0903   ALKPHOS 68 06/27/2017 0903   AST 16 06/27/2017 0903   ALT 16 06/27/2017 0903   BILITOT 0.70 06/27/2017 0903      Impression and Plan: Judith Flynn isa pleasant 76 yo white female with history of ductal carcinoma in situ of the left breast. She was diagnosed over 18 years ago and had a lumpectomy at that time. She was the treated with radiation and completed 5 years of tamoxifen for 5 years in 2006.   From my point of view, everything looks fine.  I do not see any issues with respect to her DCIS.  I think her biggest problem will clearly be smoking.  She is still smoking.  She is trying to cut back.  We will give her the Reclast today.  We will plan to get her back in one year.    Volanda Napoleon, MD 12/5/201810:41 AM

## 2017-06-27 NOTE — Patient Instructions (Signed)

## 2017-07-12 ENCOUNTER — Ambulatory Visit (INDEPENDENT_AMBULATORY_CARE_PROVIDER_SITE_OTHER): Payer: Medicare Other

## 2017-07-12 DIAGNOSIS — Z23 Encounter for immunization: Secondary | ICD-10-CM

## 2017-08-08 ENCOUNTER — Other Ambulatory Visit: Payer: Self-pay | Admitting: Family

## 2018-02-20 ENCOUNTER — Other Ambulatory Visit: Payer: Self-pay | Admitting: Hematology & Oncology

## 2018-02-20 DIAGNOSIS — Z1239 Encounter for other screening for malignant neoplasm of breast: Secondary | ICD-10-CM

## 2018-03-20 ENCOUNTER — Telehealth: Payer: Self-pay | Admitting: Family

## 2018-03-20 MED ORDER — VARENICLINE TARTRATE 1 MG PO TABS: 1.0000 mg | ORAL_TABLET | Freq: Two times a day (BID) | ORAL | 1 refills | Status: DC

## 2018-03-20 MED ORDER — VARENICLINE TARTRATE 0.5 MG X 11 & 1 MG X 42 PO MISC: ORAL | 0 refills | Status: DC

## 2018-03-20 NOTE — Telephone Encounter (Signed)
Pt requests rx for chantix. She is advised to let me know if she has mood changes on chantix. She will be out of town x 3 months with non-smokers and wants to try to quit while she is away.

## 2018-05-16 ENCOUNTER — Ambulatory Visit (INDEPENDENT_AMBULATORY_CARE_PROVIDER_SITE_OTHER): Payer: Medicare Other

## 2018-05-16 DIAGNOSIS — Z23 Encounter for immunization: Secondary | ICD-10-CM

## 2018-05-27 ENCOUNTER — Encounter (HOSPITAL_BASED_OUTPATIENT_CLINIC_OR_DEPARTMENT_OTHER): Payer: Self-pay

## 2018-05-27 ENCOUNTER — Ambulatory Visit (HOSPITAL_BASED_OUTPATIENT_CLINIC_OR_DEPARTMENT_OTHER)
Admission: RE | Admit: 2018-05-27 | Discharge: 2018-05-27 | Disposition: A | Discharge status: Home / Self Care | Payer: Medicare Other | Source: Ambulatory Visit | Attending: Hematology & Oncology | Admitting: Hematology & Oncology

## 2018-05-27 DIAGNOSIS — Z1231 Encounter for screening mammogram for malignant neoplasm of breast: Secondary | ICD-10-CM | POA: Diagnosis present

## 2018-05-27 DIAGNOSIS — Z1239 Encounter for other screening for malignant neoplasm of breast: Secondary | ICD-10-CM

## 2018-05-27 IMAGING — MG DIGITAL SCREENING BILATERAL MAMMOGRAM WITH TOMO AND CAD
8 series · 8 of 24 positions shown · non-contrast
Comparison: Previous exam(s).

CLINICAL DATA: Screening.

EXAM:
DIGITAL SCREENING BILATERAL MAMMOGRAM WITH TOMO AND CAD

[R CC synth-2D]
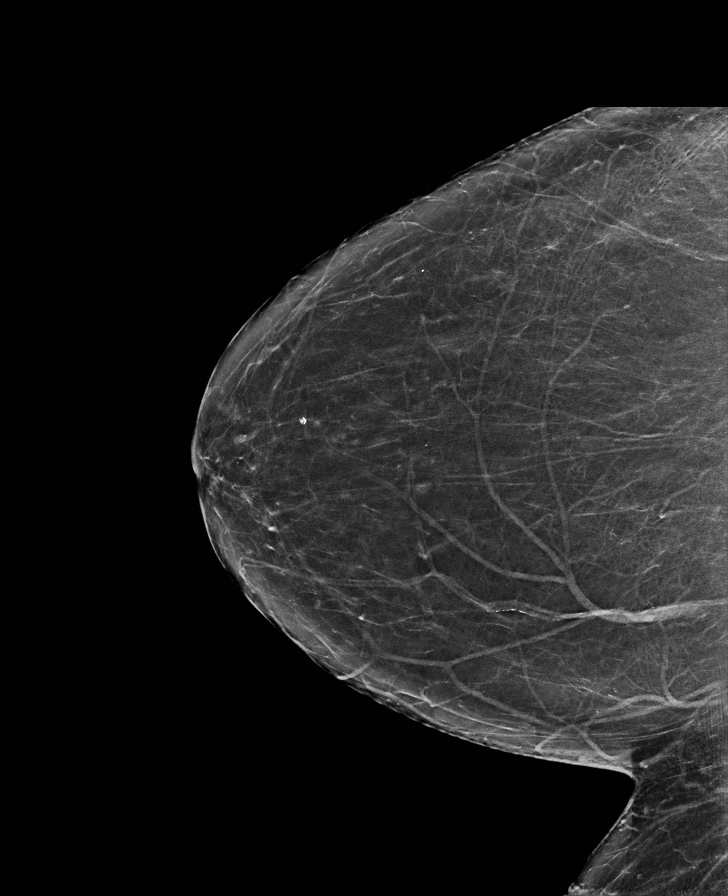

[L CC synth-2D]
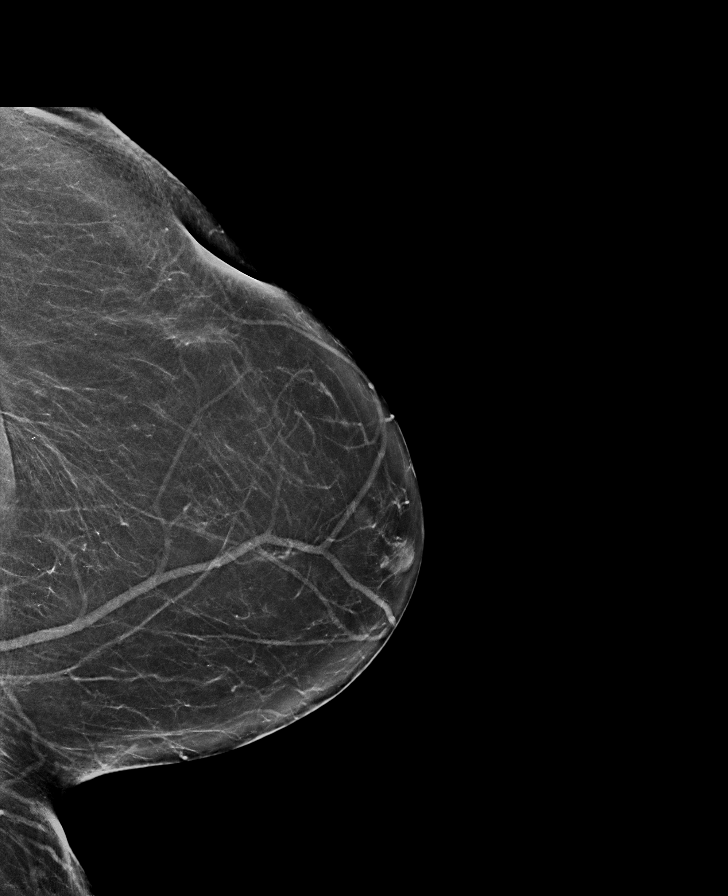

[L MLO synth-2D]
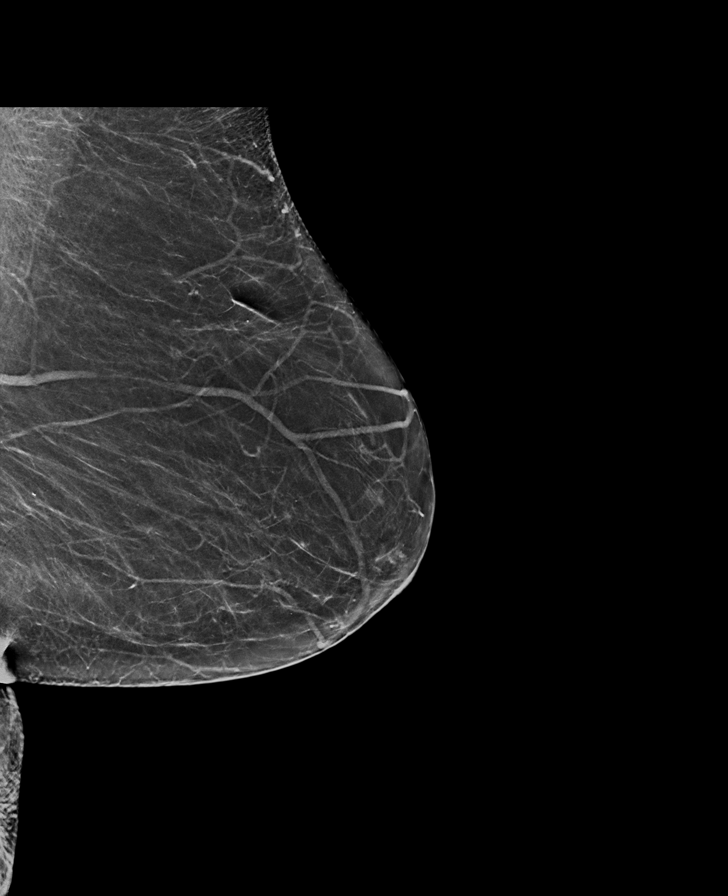

[R MLO synth-2D]
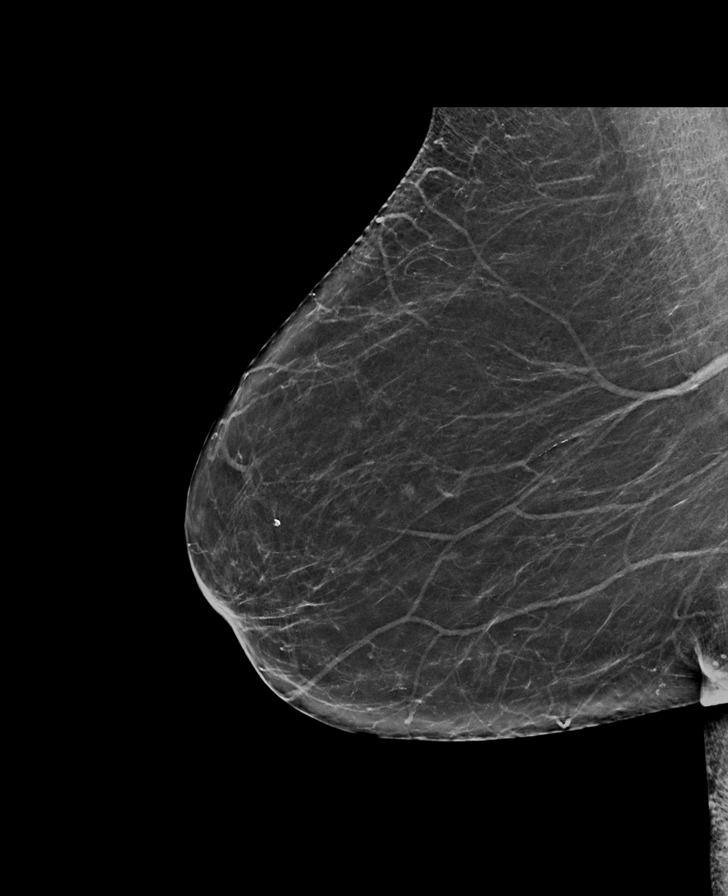

[R CC tomo · tomo slice 33/64.0]
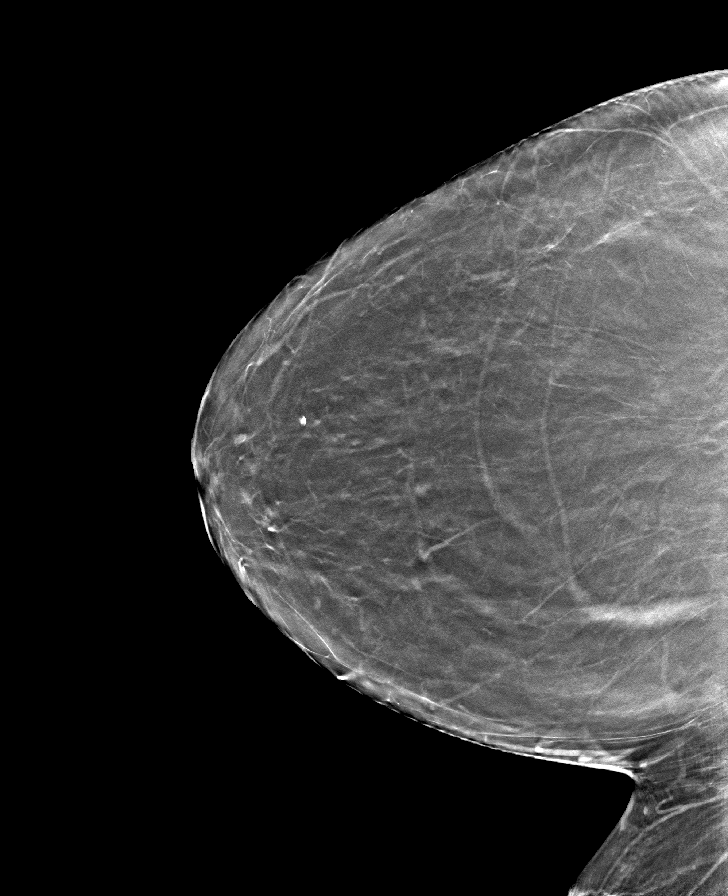

[R MLO tomo · tomo slice 33/66.0]
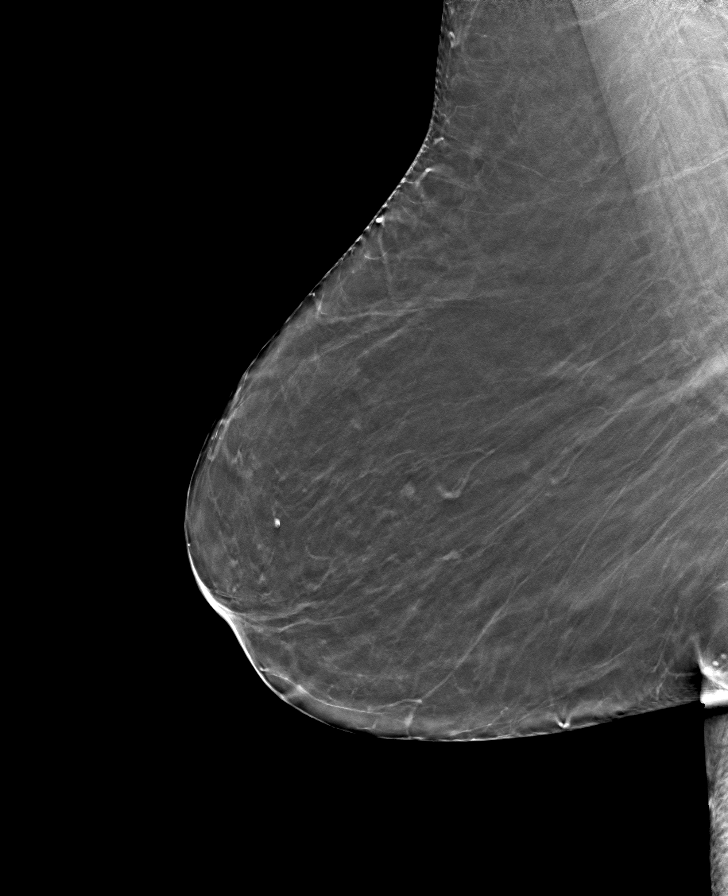

[L MLO tomo · tomo slice 32/63.0]
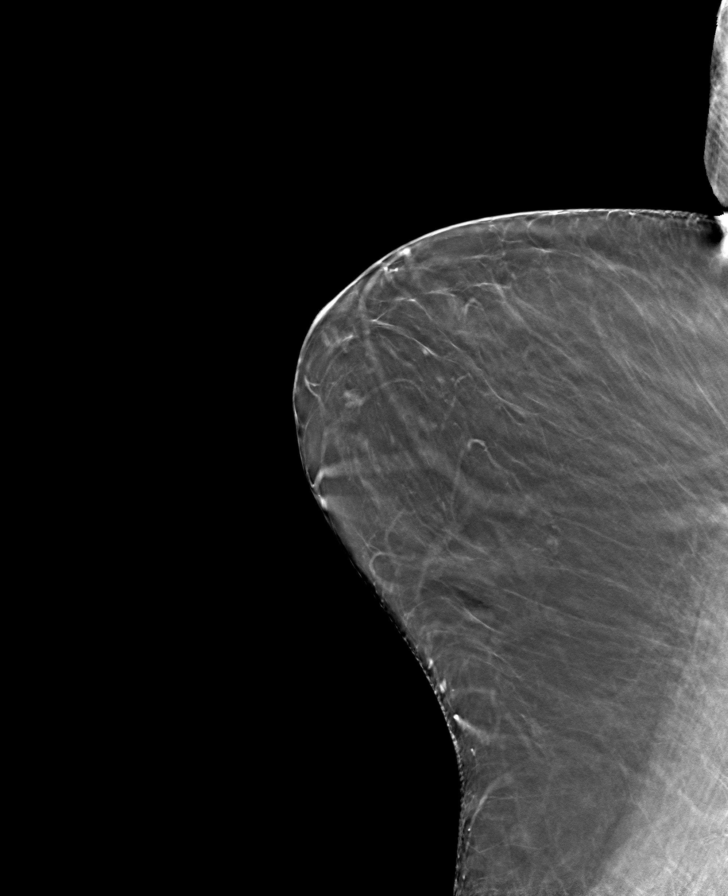

[L CC tomo · tomo slice 32/63.0]
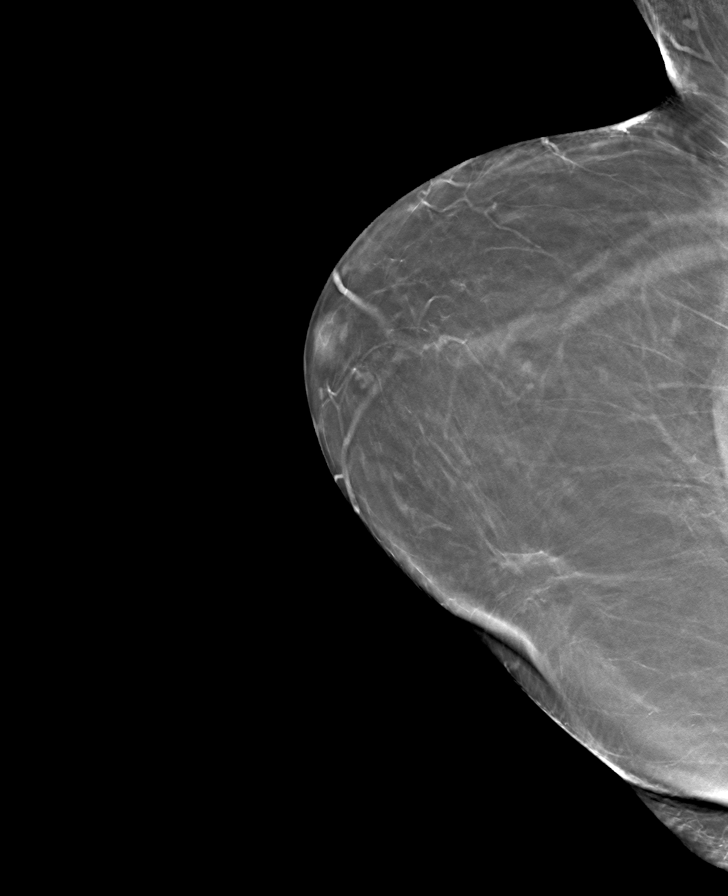

[8 of 24 positions shown; findings below may reference images not displayed]

ACR Breast Density Category b: There are scattered areas of
fibroglandular density.
FINDINGS: There are no findings suspicious for malignancy. Images were
processed with CAD.
IMPRESSION: No mammographic evidence of malignancy. A result letter of this
screening mammogram will be mailed directly to the patient.

RECOMMENDATION:
Screening mammogram in one year. (Code:[id number])

BI-RADS CATEGORY  1: Negative.

## 2018-06-26 ENCOUNTER — Inpatient Hospital Stay: Payer: Medicare Other

## 2018-06-26 ENCOUNTER — Inpatient Hospital Stay: Payer: Medicare Other | Attending: Hematology & Oncology | Admitting: Family

## 2018-06-26 DIAGNOSIS — M810 Age-related osteoporosis without current pathological fracture: Secondary | ICD-10-CM | POA: Insufficient documentation

## 2018-06-26 DIAGNOSIS — Z86000 Personal history of in-situ neoplasm of breast: Secondary | ICD-10-CM | POA: Insufficient documentation

## 2018-06-26 DIAGNOSIS — Z7982 Long term (current) use of aspirin: Secondary | ICD-10-CM | POA: Insufficient documentation

## 2018-06-26 DIAGNOSIS — Z79899 Other long term (current) drug therapy: Secondary | ICD-10-CM | POA: Insufficient documentation

## 2018-07-09 ENCOUNTER — Other Ambulatory Visit: Payer: Self-pay | Admitting: Family

## 2018-07-09 DIAGNOSIS — Z853 Personal history of malignant neoplasm of breast: Secondary | ICD-10-CM

## 2018-07-10 ENCOUNTER — Other Ambulatory Visit: Payer: Self-pay

## 2018-07-10 ENCOUNTER — Inpatient Hospital Stay: Payer: Medicare Other

## 2018-07-10 ENCOUNTER — Encounter: Payer: Self-pay | Admitting: Family

## 2018-07-10 ENCOUNTER — Inpatient Hospital Stay (HOSPITAL_BASED_OUTPATIENT_CLINIC_OR_DEPARTMENT_OTHER): Payer: Medicare Other | Admitting: Family

## 2018-07-10 VITALS — BP 141/69 | HR 68 | Temp 97.8°F | Resp 20 | Wt 146.1 lb

## 2018-07-10 DIAGNOSIS — Z853 Personal history of malignant neoplasm of breast: Secondary | ICD-10-CM

## 2018-07-10 DIAGNOSIS — M810 Age-related osteoporosis without current pathological fracture: Secondary | ICD-10-CM

## 2018-07-10 DIAGNOSIS — Z79899 Other long term (current) drug therapy: Secondary | ICD-10-CM

## 2018-07-10 DIAGNOSIS — Z7982 Long term (current) use of aspirin: Secondary | ICD-10-CM | POA: Diagnosis not present

## 2018-07-10 DIAGNOSIS — Z86000 Personal history of in-situ neoplasm of breast: Secondary | ICD-10-CM | POA: Diagnosis present

## 2018-07-10 LAB — CBC WITH DIFFERENTIAL (CANCER CENTER ONLY)
Abs Immature Granulocytes: 0.02 10*3/uL (ref 0.00–0.07)
BASOS ABS: 0 10*3/uL (ref 0.0–0.1)
Basophils Relative: 1 %
EOS ABS: 0.1 10*3/uL (ref 0.0–0.5)
EOS PCT: 1 %
HCT: 41.3 % (ref 36.0–46.0)
HEMOGLOBIN: 13.3 g/dL (ref 12.0–15.0)
IMMATURE GRANULOCYTES: 0 %
LYMPHS ABS: 1.3 10*3/uL (ref 0.7–4.0)
LYMPHS PCT: 22 %
MCH: 31.2 pg (ref 26.0–34.0)
MCHC: 32.2 g/dL (ref 30.0–36.0)
MCV: 96.9 fL (ref 80.0–100.0)
MONOS PCT: 7 %
Monocytes Absolute: 0.4 10*3/uL (ref 0.1–1.0)
Neutro Abs: 4.1 10*3/uL (ref 1.7–7.7)
Neutrophils Relative %: 69 %
Platelet Count: 209 10*3/uL (ref 150–400)
RBC: 4.26 MIL/uL (ref 3.87–5.11)
RDW: 11.9 % (ref 11.5–15.5)
WBC Count: 5.9 10*3/uL (ref 4.0–10.5)
nRBC: 0 % (ref 0.0–0.2)

## 2018-07-10 LAB — CMP (CANCER CENTER ONLY)
ALBUMIN: 4.4 g/dL (ref 3.5–5.0)
ALT: 8 U/L (ref 0–44)
AST: 12 U/L — AB (ref 15–41)
Alkaline Phosphatase: 71 U/L (ref 38–126)
Anion gap: 7 (ref 5–15)
BILIRUBIN TOTAL: 0.6 mg/dL (ref 0.3–1.2)
BUN: 11 mg/dL (ref 8–23)
CO2: 29 mmol/L (ref 22–32)
CREATININE: 1.02 mg/dL — AB (ref 0.44–1.00)
Calcium: 8.9 mg/dL (ref 8.9–10.3)
Chloride: 106 mmol/L (ref 98–111)
GFR, EST NON AFRICAN AMERICAN: 53 mL/min — AB (ref >60)
GFR, Est AFR Am: >60 mL/min (ref >60)
GLUCOSE: 104 mg/dL — AB (ref 70–99)
POTASSIUM: 4.1 mmol/L (ref 3.5–5.1)
Sodium: 142 mmol/L (ref 135–145)
TOTAL PROTEIN: 6.5 g/dL (ref 6.5–8.1)

## 2018-07-10 MED ORDER — ZOLEDRONIC ACID 4 MG/100ML IV SOLN
4.0000 mg | Freq: Once | INTRAVENOUS | Status: AC
  Administered 2018-07-10: 4 mg via INTRAVENOUS
  Filled 2018-07-10: qty 100

## 2018-07-10 MED ORDER — SODIUM CHLORIDE 0.9 % IV SOLN
Freq: Once | INTRAVENOUS | Status: AC
Start: 2018-07-10 — End: 2018-07-10
  Administered 2018-07-10: 13:00:00 via INTRAVENOUS
  Filled 2018-07-10: qty 250

## 2018-07-10 NOTE — Progress Notes (Signed)
Hematology and Oncology Follow Up Visit  Judith Flynn [id number] August 28, 1940 77 y.o. 07/10/2018   Principle Diagnosis:  Ductal carcinoma in situ of the left breast Osteoporosis  Current Therapy:   Zometa 5 mg IV q yr   Interim History:  Judith Flynn is here today for follow-up and Zometa infusion. She is doing quite well and has no complaints at this time.  Bilateral breast exam today was negative. No mass, lesion or rash noted.  No fever, chills, n/v, cough, rash, dizziness, SOB, chest pain, palpitations, abdominal pain or changes in bowel or bladder habits.  No episodes of bleeding, no bruising or petechiae.  No swelling, tenderness, numbness or tingling in her extremities.  No falls or syncopal episodes.  No lymphadenopathy noted on exam.  She has a good appetite and is staying well hydrated. Her weight is stable.   ECOG Performance Status: 1 - Symptomatic but completely ambulatory  Medications:  Allergies as of 07/10/2018      Reactions   Bupropion    tremors   Lisinopril    REACTION: cough      Medication List       Accurate as of July 10, 2018 11:52 AM. Always use your most recent med list.        acetaminophen 325 MG tablet Commonly known as:  TYLENOL Take 650 mg by mouth every 6 (six) hours as needed.   aspirin 81 MG tablet Take 81 mg by mouth daily.   atorvastatin 40 MG tablet Commonly known as:  LIPITOR TAKE 1 TABLET BY MOUTH  DAILY   CALCIUM 1200 1200-1000 MG-UNIT Chew Chew 1 tablet by mouth daily.   hydrochlorothiazide 25 MG tablet Commonly known as:  HYDRODIURIL TAKE 1 TABLET BY MOUTH  DAILY   omeprazole 20 MG capsule Commonly known as:  PRILOSEC TAKE 1 CAPSULE BY MOUTH TWO TIMES DAILY   PRESERVISION AREDS 2 Caps Take 1 capsule by mouth daily.   RECLAST 5 MG/100ML Soln injection Generic drug:  zoledronic acid Inject 5 mg into the vein. Annual infusion   varenicline 0.5 MG X 11 & 1 MG X 42 tablet Commonly known as:  CHANTIX  STARTING MONTH PAK one 0.5 mg tab by mouth 1 x  daily for 3 days, then increase to one 0.5 mg tablet 2 x daily for 4 days, then increase to one 1 mg tab bid   varenicline 1 MG tablet Commonly known as:  CHANTIX CONTINUING MONTH PAK Take 1 tablet (1 mg total) by mouth 2 (two) times daily.   Vitamin D3 75 MCG (3000 UT) Tabs Take 1 tablet by mouth daily.       Allergies:  Allergies  Allergen Reactions  . Bupropion     tremors  . Lisinopril     REACTION: cough    Past Medical History, Surgical history, Social history, and Family History were reviewed and updated.  Review of Systems: All other 10 point review of systems is negative.   Physical Exam:  vitals were not taken for this visit.   Wt Readings from Last 3 Encounters:  06/27/17 140 lb (63.5 kg)  07/04/16 145 lb 3.2 oz (65.9 kg)  05/31/16 144 lb 1.9 oz (65.4 kg)    Ocular: Sclerae unicteric, pupils equal, round and reactive to light Ear-nose-throat: Oropharynx clear, dentition fair Lymphatic: No cervical, supraclavicular or axillary adenopathy Lungs no rales or rhonchi, good excursion bilaterally Heart regular rate and rhythm, no murmur appreciated Abd soft, nontender, positive bowel sounds, no liver  or spleen tip palpated on exam, no fluid wave  MSK no focal spinal tenderness, no joint edema Neuro: non-focal, well-oriented, appropriate affect Breasts: Bilateral breast exam today was negative. No mass, lesion or rash noted.   Lab Results  Component Value Date   WBC 5.9 07/10/2018   HGB 13.3 07/10/2018   HCT 41.3 07/10/2018   MCV 96.9 07/10/2018   PLT 209 07/10/2018   No results found for: FERRITIN, IRON, TIBC, UIBC, IRONPCTSAT Lab Results  Component Value Date   RBC 4.26 07/10/2018   No results found for: KPAFRELGTCHN, LAMBDASER, KAPLAMBRATIO No results found for: IGGSERUM, IGA, IGMSERUM No results found for: Odetta Pink, SPEI   Chemistry        Component Value Date/Time   NA 142 07/10/2018 1119   NA 146 (H) 06/27/2017 0903   K 4.1 07/10/2018 1119   K 3.9 06/27/2017 0903   CL 106 07/10/2018 1119   CL 106 06/27/2017 0903   CO2 29 07/10/2018 1119   CO2 27 06/27/2017 0903   BUN 11 07/10/2018 1119   BUN 11 06/27/2017 0903   CREATININE 1.02 (H) 07/10/2018 1119   CREATININE 0.8 06/27/2017 0903      Component Value Date/Time   CALCIUM 8.9 07/10/2018 1119   CALCIUM 9.2 06/27/2017 0903   ALKPHOS 71 07/10/2018 1119   ALKPHOS 68 06/27/2017 0903   AST 12 (L) 07/10/2018 1119   ALT 8 07/10/2018 1119   ALT 16 06/27/2017 0903   BILITOT 0.6 07/10/2018 1119       Impression and Plan: Judith Flynn is a very pleasant 77 yo caucasian female with history of ductal carcinoma in situ of the left breast almost 20 years ago. She had a left lumpectomy followed by radiation. She completed 5 years of Tamoxifen in 2006.  She continues to do well and has no complaints at this time.  She received Zometa today.  We will see her again in another year.  She will contact our office with any questions or concerns. We can certainly see her sooner if need be.   Laverna Peace, NP 12/18/201911:52 AM

## 2018-07-10 NOTE — Patient Instructions (Signed)

## 2018-09-09 ENCOUNTER — Other Ambulatory Visit: Payer: Self-pay | Admitting: Family

## 2018-09-17 ENCOUNTER — Telehealth: Payer: Self-pay | Admitting: Family

## 2018-09-17 NOTE — Telephone Encounter (Signed)
Copied from [place] [id number]. Topic: Quick Communication - See Telephone Encounter >> Sep 17, 2018 10:27 AM Vernona Rieger wrote: CRM for notification. See Telephone encounter for: 09/17/18.  [facility] would like to know do they need to keep atorvastatin (LIPITOR) 40 MG tablet , hydrochlorothiazide (HYDRODIURIL) 25 MG tablet & omeprazole (PRILOSEC) 20 MG capsule on file? Please Advise.  [phone]

## 2018-09-27 ENCOUNTER — Other Ambulatory Visit: Payer: Self-pay

## 2018-09-27 MED ORDER — OMEPRAZOLE 20 MG PO CPDR: DELAYED_RELEASE_CAPSULE | ORAL | 1 refills | Status: DC

## 2018-09-27 MED ORDER — ATORVASTATIN CALCIUM 40 MG PO TABS: 40.0000 mg | ORAL_TABLET | Freq: Every day | ORAL | 1 refills | Status: DC

## 2018-09-27 MED ORDER — HYDROCHLOROTHIAZIDE 25 MG PO TABS: 25.0000 mg | ORAL_TABLET | Freq: Every day | ORAL | 1 refills | Status: DC

## 2018-09-27 NOTE — Telephone Encounter (Signed)
Talked to [facility] representative, all new prescriptions sent in

## 2018-10-18 ENCOUNTER — Other Ambulatory Visit: Payer: Self-pay | Admitting: Family

## 2018-10-31 ENCOUNTER — Telehealth: Payer: Self-pay

## 2018-10-31 NOTE — Telephone Encounter (Signed)
Last OV 2017- virtual visit?

## 2018-11-05 NOTE — Telephone Encounter (Signed)
Talked to patient and she does not have a computer or smart phone. She said she is doing well and will like to wait until she can come in the office.

## 2019-02-25 ENCOUNTER — Ambulatory Visit (INDEPENDENT_AMBULATORY_CARE_PROVIDER_SITE_OTHER): Payer: Medicare Other | Admitting: Family

## 2019-02-25 ENCOUNTER — Other Ambulatory Visit: Payer: Self-pay

## 2019-02-25 VITALS — BP 130/69 | HR 78 | Temp 98.6°F | Resp 16 | Ht 61.0 in | Wt 144.2 lb

## 2019-02-25 DIAGNOSIS — K219 Gastro-esophageal reflux disease without esophagitis: Secondary | ICD-10-CM | POA: Diagnosis not present

## 2019-02-25 DIAGNOSIS — F17200 Nicotine dependence, unspecified, uncomplicated: Secondary | ICD-10-CM | POA: Diagnosis not present

## 2019-02-25 DIAGNOSIS — E785 Hyperlipidemia, unspecified: Secondary | ICD-10-CM

## 2019-02-25 DIAGNOSIS — I10 Essential (primary) hypertension: Secondary | ICD-10-CM | POA: Diagnosis not present

## 2019-02-25 DIAGNOSIS — M8000XD Age-related osteoporosis with current pathological fracture, unspecified site, subsequent encounter for fracture with routine healing: Secondary | ICD-10-CM

## 2019-02-25 NOTE — Progress Notes (Signed)
Subjective:    Patient ID: Judith Flynn, female    DOB: [birth year], 78 y.o.   MRN: [id number]  HPI  Patient is a 78 yr old female who presents today for follow up.  Hyperlipidemia- Maintained on atorvastin.  Denies myalgia.  Lab Results  Component Value Date   CHOL 192 12/13/2015   HDL 49.70 12/13/2015   LDLCALC 117 (H) 12/13/2015   LDLDIRECT 100.0 02/19/2015   TRIG 126.0 12/13/2015   CHOLHDL 4 12/13/2015   HTN- stable on hctz. Reports occasional ankle swelling in the humidity.  BP Readings from Last 3 Encounters:  02/25/19 130/69  07/10/18 (!) 141/69  06/27/17 (!) 148/60   Tobacco abuse- reports that she just refilled the chantix which is helping.  She reports + nausea if she takes bid, so she cut down to once a day and is tolerating. Reports that she is smoking <1 pack per week.    Osteoporosis- last reclast was December.    GERD- reports stable on omeprazole.     Review of Systems See HPI  Past Medical History:  Diagnosis Date  . Cancer [facility]) 2000   breast- left breast- s/p lumpectomy and radiation  . Colon polyp   . Hearing loss   . Hyperlipidemia   . Hypertension   . Osteoporosis   . Tobacco abuse      Social History   Socioeconomic History  . Marital status: Widowed    Spouse name: Not on file  . Number of children: 3  . Years of education: Not on file  . Highest education level: Not on file  Occupational History  . Occupation: retired    Fish farm manager: RETIRED  Social Needs  . Financial resource strain: Not on file  . Food insecurity    Worry: Not on file    Inability: Not on file  . Transportation needs    Medical: Not on file    Non-medical: Not on file  Tobacco Use  . Smoking status: Current Every Day Smoker    Packs/day: 0.50    Types: Cigarettes  . Smokeless tobacco: Never Used  . Tobacco comment: 05-22-14  smokes daily  Substance and Sexual Activity  . Alcohol use: Yes    Alcohol/week: 0.0 standard drinks    Comment: Rare  .  Drug use: No  . Sexual activity: Yes  Lifestyle  . Physical activity    Days per week: Not on file    Minutes per session: Not on file  . Stress: Not on file  Relationships  . Social Herbalist on phone: Not on file    Gets together: Not on file    Attends religious service: Not on file    Active member of club or organization: Not on file    Attends meetings of clubs or organizations: Not on file    Relationship status: Not on file  . Intimate partner violence    Fear of current or ex partner: Not on file    Emotionally abused: Not on file    Physically abused: Not on file    Forced sexual activity: Not on file  Other Topics Concern  . Not on file  Social History Narrative   Widow/ widower   Regular exercise- yes   Originally from [place]          Past Surgical History:  Procedure Laterality Date  . BREAST BIOPSY Left 2000  . BREAST LUMPECTOMY Left 2000   left  . CATARACT  EXTRACTION, BILATERAL Bilateral July and August 2015  . CHOLECYSTECTOMY    . surgery on ear drum     right    Family History  Problem Relation Age of Onset  . Alcohol abuse Mother   . Arthritis Mother   . Hypertension Mother 18  . Heart disease Mother        CHF  . Alcohol abuse Father   . Arthritis Father   . Hypertension Father 20  . Heart attack Father        Died of MI at age 71    Allergies  Allergen Reactions  . Bupropion     tremors  . Lisinopril     REACTION: cough    Current Outpatient Medications on File Prior to Visit  Medication Sig Dispense Refill  . acetaminophen (TYLENOL) 325 MG tablet Take 650 mg by mouth every 6 (six) hours as needed.      Marland Kitchen aspirin 81 MG tablet Take 81 mg by mouth daily.    Marland Kitchen atorvastatin (LIPITOR) 40 MG tablet Take 1 tablet (40 mg total) by mouth daily. 90 tablet 1  . Calcium Carbonate-Vit D-Min (CALCIUM 1200) 1200-1000 MG-UNIT CHEW Chew 1 tablet by mouth daily.     . CHANTIX CONTINUING MONTH PAK 1 MG tablet TAKE ONE (1) TABLET BY MOUTH  TWO (2) TIMES DAILY 56 tablet 1  . Cholecalciferol (VITAMIN D3) 3000 units TABS Take 1 tablet by mouth daily. 30 tablet   . hydrochlorothiazide (HYDRODIURIL) 25 MG tablet Take 1 tablet (25 mg total) by mouth daily. 90 tablet 1  . Multiple Vitamins-Minerals (PRESERVISION AREDS 2) CAPS Take 1 capsule by mouth daily.    Marland Kitchen omeprazole (PRILOSEC) 20 MG capsule TAKE 1 CAPSULE BY MOUTH TWO TIMES DAILY 180 capsule 1  . varenicline (CHANTIX STARTING MONTH PAK) 0.5 MG X 11 & 1 MG X 42 tablet one 0.5 mg tab by mouth 1 x  daily for 3 days, then increase to one 0.5 mg tablet 2 x daily for 4 days, then increase to one 1 mg tab bid 53 tablet 0  . zoledronic acid (RECLAST) 5 MG/100ML SOLN Inject 5 mg into the vein. Annual infusion     No current facility-administered medications on file prior to visit.     BP 130/69 (BP Location: Right Arm, Patient Position: Sitting, Cuff Size: Small)   Pulse 78   Temp 98.6 F (37 C) (Oral)   Resp 16   Ht 5\' 1"  (1.549 m)   Wt 144 lb 3.2 oz (65.4 kg)   SpO2 96%   BMI 27.25 kg/m       Objective:   Physical Exam Constitutional:      Appearance: She is well-developed.  Neck:     Musculoskeletal: Neck supple.     Thyroid: No thyromegaly.  Cardiovascular:     Rate and Rhythm: Normal rate and regular rhythm.     Heart sounds: Normal heart sounds. No murmur.  Pulmonary:     Effort: Pulmonary effort is normal. No respiratory distress.     Breath sounds: Normal breath sounds. No wheezing.  Skin:    General: Skin is warm and dry.  Neurological:     Mental Status: She is alert and oriented to person, place, and time.  Psychiatric:        Behavior: Behavior normal.        Thought Content: Thought content normal.        Judgment: Judgment normal.  Assessment & Plan:  Tobacco abuse- encouraged pt to continue to work towards complete cessation.  Osteoporosis- she continues annual reclast which is being given by her oncologist.   HTN- bp stable on  current meds. Continue same.   GERD- stable on PPI, continue same.   Hyperlipidemia- tolertating statin, obtain follow up lipid panel.

## 2019-02-25 NOTE — Patient Instructions (Signed)
Please complete lab work prior to leaving.   

## 2019-02-26 ENCOUNTER — Telehealth: Payer: Self-pay | Admitting: Family

## 2019-02-26 LAB — BASIC METABOLIC PANEL
BUN: 13 mg/dL (ref 6–23)
CO2: 27 mEq/L (ref 19–32)
Calcium: 9.2 mg/dL (ref 8.4–10.5)
Chloride: 104 mEq/L (ref 96–112)
Creatinine, Ser: 0.88 mg/dL (ref 0.40–1.20)
GFR: 61.89 mL/min (ref >60.00)
Glucose, Bld: 103 mg/dL — ABNORMAL HIGH (ref 70–99)
Potassium: 3.6 mEq/L (ref 3.5–5.1)
Sodium: 140 mEq/L (ref 135–145)

## 2019-02-26 LAB — LIPID PANEL
Cholesterol: 154 mg/dL (ref 0–200)
HDL: 57 mg/dL (ref >39.00)
LDL Cholesterol: 76 mg/dL (ref 0–99)
NonHDL: 97.02
Total CHOL/HDL Ratio: 3
Triglycerides: 104 mg/dL (ref 0.0–149.0)
VLDL: 20.8 mg/dL (ref 0.0–40.0)

## 2019-02-26 NOTE — Telephone Encounter (Signed)
Results given to patient, she verbalized understanding.

## 2019-02-26 NOTE — Telephone Encounter (Signed)
Please advise pt that cholesterol looks good. Kidney function and electrolytes are normal.

## 2019-03-05 ENCOUNTER — Other Ambulatory Visit: Payer: Self-pay

## 2019-03-05 DIAGNOSIS — Z20822 Contact with and (suspected) exposure to covid-19: Secondary | ICD-10-CM

## 2019-03-07 LAB — NOVEL CORONAVIRUS, NAA: SARS-CoV-2, NAA: NOT DETECTED

## 2019-04-30 ENCOUNTER — Other Ambulatory Visit: Payer: Self-pay | Admitting: Family

## 2019-05-22 ENCOUNTER — Ambulatory Visit: Payer: Medicare Other

## 2019-05-23 ENCOUNTER — Other Ambulatory Visit: Payer: Self-pay

## 2019-05-23 ENCOUNTER — Ambulatory Visit (INDEPENDENT_AMBULATORY_CARE_PROVIDER_SITE_OTHER): Payer: Medicare Other

## 2019-05-23 DIAGNOSIS — Z23 Encounter for immunization: Secondary | ICD-10-CM | POA: Diagnosis not present

## 2019-05-30 ENCOUNTER — Ambulatory Visit: Payer: Medicare Other | Admitting: *Deleted

## 2019-06-02 NOTE — Progress Notes (Addendum)
Virtual Visit via Video Note  I connected with patient on 06/03/19 at  9:00 AM EST by audio enabled telemedicine application and verified that I am speaking with the correct person using two identifiers.   THIS ENCOUNTER IS A VIRTUAL VISIT DUE TO COVID-19 - PATIENT WAS NOT SEEN IN THE OFFICE. PATIENT HAS CONSENTED TO VIRTUAL VISIT / TELEMEDICINE VISIT   Location of patient: home  Location of provider: office  I discussed the limitations of evaluation and management by telemedicine and the availability of in person appointments. The patient expressed understanding and agreed to proceed.   Subjective:   Judith Flynn is a 78 y.o. female who presents for Medicare Annual (Subsequent) preventive examination.  Review of Systems:  Home Safety/Smoke Alarms: Feels safe in home. Smoke alarms in place.  Lives w/ dtr in 2 story home. Bedroom upstairs. Does well navigating stairs.    Female:        Mammo- pt states she will schedule.       Dexa scan-  ordered     CCS- declines at this time    Objective:     Vitals: BP 127/86 Comment: pt reported  Wt 142 lb (64.4 kg)   BMI 26.83 kg/m   Body mass index is 26.83 kg/m.  Advanced Directives 06/03/2019 07/10/2018 06/27/2017 07/04/2016 05/31/2016 05/21/2015  Does Patient Have a Medical Advance Directive? No No No No No No  Would patient like information on creating a medical advance directive? No - Patient declined No - Patient declined No - Patient declined Yes (MAU/Ambulatory/Procedural Areas - Information given) - No - patient declined information    Tobacco Social History   Tobacco Use  Smoking Status Current Some Day Smoker  . Packs/day: 0.50  . Types: Cigarettes  Smokeless Tobacco Never Used  Tobacco Comment   05-22-14  smokes daily     Ready to quit: Not Answered Counseling given: Not Answered Comment: 05-22-14  smokes daily   Clinical Intake: Pain : No/denies pain     Past Medical History:  Diagnosis Date  .  Cancer [facility]) 2000   breast- left breast- s/p lumpectomy and radiation  . Colon polyp   . Hearing loss   . Hyperlipidemia   . Hypertension   . Osteoporosis   . Tobacco abuse    Past Surgical History:  Procedure Laterality Date  . BREAST BIOPSY Left 2000  . BREAST LUMPECTOMY Left 2000   left  . CATARACT EXTRACTION, BILATERAL Bilateral July and August 2015  . CHOLECYSTECTOMY    . surgery on ear drum     right   Family History  Problem Relation Age of Onset  . Alcohol abuse Mother   . Arthritis Mother   . Hypertension Mother 51  . Heart disease Mother        CHF  . Alcohol abuse Father   . Arthritis Father   . Hypertension Father 74  . Heart attack Father        Died of MI at age 54   Social History   Socioeconomic History  . Marital status: Widowed    Spouse name: Not on file  . Number of children: 3  . Years of education: Not on file  . Highest education level: Not on file  Occupational History  . Occupation: retired    Fish farm manager: RETIRED  Social Needs  . Financial resource strain: Not on file  . Food insecurity    Worry: Not on file    Inability: Not  on file  . Transportation needs    Medical: Not on file    Non-medical: Not on file  Tobacco Use  . Smoking status: Current Some Day Smoker    Packs/day: 0.50    Types: Cigarettes  . Smokeless tobacco: Never Used  . Tobacco comment: 05-22-14  smokes daily  Substance and Sexual Activity  . Alcohol use: Yes    Alcohol/week: 0.0 standard drinks    Comment: Rare  . Drug use: No  . Sexual activity: Yes  Lifestyle  . Physical activity    Days per week: Not on file    Minutes per session: Not on file  . Stress: Not on file  Relationships  . Social Herbalist on phone: Not on file    Gets together: Not on file    Attends religious service: Not on file    Active member of club or organization: Not on file    Attends meetings of clubs or organizations: Not on file    Relationship status: Not on  file  Other Topics Concern  . Not on file  Social History Narrative   Widow/ widower   Regular exercise- yes   Originally from [place]          Outpatient Encounter Medications as of 06/03/2019  Medication Sig  . acetaminophen (TYLENOL) 325 MG tablet Take 650 mg by mouth every 6 (six) hours as needed.    Marland Kitchen aspirin 81 MG tablet Take 81 mg by mouth daily.  Marland Kitchen atorvastatin (LIPITOR) 40 MG tablet TAKE 1 TABLET BY MOUTH  DAILY  . Calcium Carbonate-Vit D-Min (CALCIUM 1200) 1200-1000 MG-UNIT CHEW Chew 1 tablet by mouth daily.   . CHANTIX CONTINUING MONTH PAK 1 MG tablet TAKE ONE (1) TABLET BY MOUTH TWO (2) TIMES DAILY  . Cholecalciferol (VITAMIN D3) 3000 units TABS Take 1 tablet by mouth daily.  . hydrochlorothiazide (HYDRODIURIL) 25 MG tablet TAKE 1 TABLET BY MOUTH  DAILY  . Multiple Vitamins-Minerals (PRESERVISION AREDS 2) CAPS Take 1 capsule by mouth daily.  Marland Kitchen omeprazole (PRILOSEC) 20 MG capsule TAKE 1 CAPSULE BY MOUTH TWO TIMES DAILY  . varenicline (CHANTIX STARTING MONTH PAK) 0.5 MG X 11 & 1 MG X 42 tablet one 0.5 mg tab by mouth 1 x  daily for 3 days, then increase to one 0.5 mg tablet 2 x daily for 4 days, then increase to one 1 mg tab bid  . zoledronic acid (RECLAST) 5 MG/100ML SOLN Inject 5 mg into the vein. Annual infusion   No facility-administered encounter medications on file as of 06/03/2019.     Activities of Daily Living In your present state of health, do you have any difficulty performing the following activities: 06/03/2019  Hearing? Y  Comment wears hearing aids  Vision? N  Comment wears readers  Difficulty concentrating or making decisions? N  Walking or climbing stairs? N  Dressing or bathing? N  Doing errands, shopping? N  Preparing Food and eating ? N  Using the Toilet? N  In the past six months, have you accidently leaked urine? N  Do you have problems with loss of bowel control? N  Managing your Medications? N  Managing your Finances? N  Housekeeping or  managing your Housekeeping? N  Some recent data might be hidden    Patient Care Team: Debbrah Alar, NP as PCP - General Volanda Napoleon, MD as Attending Physician (Hematology and Oncology)    Assessment:   This is a routine wellness  examination for Lelan Pons. Physical assessment deferred to PCP.  Exercise Activities and Dietary recommendations Current Exercise Habits: The patient does not participate in regular exercise at present, Exercise limited by: None identified Diet (meal preparation, eat out, water intake, caffeinated beverages, dairy products, fruits and vegetables): in general, a "healthy" diet  , well balanced   Goals    . Increase water intake    . Weight (lb) < 135 lb (61.2 kg)       Fall Risk Fall Risk  06/03/2019 07/04/2016 05/31/2016 12/13/2015 08/05/2014  Falls in the past year? 0 No No Yes No  Number falls in past yr: 0 - - 1 -  Comment - - - Tripped over the dog leash -  Injury with Fall? 0 - - No -     Depression Screen PHQ 2/9 Scores 06/03/2019 07/04/2016 12/13/2015 08/05/2014  PHQ - 2 Score 0 0 0 0     Cognitive Function Ad8 score reviewed for issues:  Issues making decisions:no  Less interest in hobbies / activities:no  Repeats questions, stories (family complaining): no  Trouble using ordinary gadgets (microwave, computer, phone):no  Forgets the month or year: no  Mismanaging finances: no  Remembering appts:no Daily problems with thinking and/or memory:no Ad8 score is=0     MMSE - Mini Mental State Exam 07/04/2016  Orientation to time 5  Orientation to Place 5  Registration 3  Attention/ Calculation 5  Recall 2  Language- name 2 objects 2  Language- repeat 1  Language- follow 3 step command 3  Language- read & follow direction 1  Write a sentence 1  Copy design 1  Total score 29        Immunization History  Administered Date(s) Administered  . Fluad Quad(high Dose 65+) 05/23/2019  . Influenza Split 04/19/2011,  05/01/2012  . Influenza Whole 05/03/2009, 06/03/2009, 05/02/2010  . Influenza, High Dose Seasonal PF 06/10/2013, 05/05/2015, 05/31/2016, 07/12/2017, 05/16/2018  . Influenza,inj,Quad PF,6+ Mos 05/22/2014  . Pneumococcal Conjugate-13 02/10/2014  . Pneumococcal Polysaccharide-23 05/07/2006, 06/03/2009  . Td 06/03/2009    Screening Tests Health Maintenance  Topic Date Due  . MAMMOGRAM  05/28/2019  . TETANUS/TDAP  06/04/2019  . INFLUENZA VACCINE  Completed  . DEXA SCAN  Completed  . PNA vac Low Risk Adult  Completed      Plan:    See you next year!  Continue to eat heart healthy diet (full of fruits, vegetables, whole grains, lean protein, water--limit salt, fat, and sugar intake) and increase physical activity as tolerated.  Continue doing brain stimulating activities (puzzles, reading, adult coloring books, staying active) to keep memory sharp.   Bring a copy of your living will and/or healthcare power of attorney to your next office visit.  I have ordered your bone density scan.  I have personally reviewed and noted the following in the patient's chart:   . Medical and social history . Use of alcohol, tobacco or illicit drugs  . Current medications and supplements . Functional ability and status . Nutritional status . Physical activity . Advanced directives . List of other physicians . Hospitalizations, surgeries, and ER visits in previous 12 months . Vitals . Screenings to include cognitive, depression, and falls . Referrals and appointments  In addition, I have reviewed and discussed with patient certain preventive protocols, quality metrics, and best practice recommendations. A written personalized care plan for preventive services as well as general preventive health recommendations were provided to patient.     Shela Nevin, RN  06/03/2019  Reviewed  Ann Held, DO

## 2019-06-03 ENCOUNTER — Ambulatory Visit (INDEPENDENT_AMBULATORY_CARE_PROVIDER_SITE_OTHER): Payer: Medicare Other | Admitting: *Deleted

## 2019-06-03 ENCOUNTER — Other Ambulatory Visit: Payer: Self-pay

## 2019-06-03 ENCOUNTER — Encounter: Payer: Self-pay | Admitting: *Deleted

## 2019-06-03 VITALS — BP 127/86 | Wt 142.0 lb

## 2019-06-03 DIAGNOSIS — Z780 Asymptomatic menopausal state: Secondary | ICD-10-CM

## 2019-06-03 DIAGNOSIS — Z0000 Encounter for general adult medical examination without abnormal findings: Secondary | ICD-10-CM | POA: Diagnosis not present

## 2019-06-03 NOTE — Patient Instructions (Signed)
See you next year!  Continue to eat heart healthy diet (full of fruits, vegetables, whole grains, lean protein, water--limit salt, fat, and sugar intake) and increase physical activity as tolerated.  Continue doing brain stimulating activities (puzzles, reading, adult coloring books, staying active) to keep memory sharp.   Bring a copy of your living will and/or healthcare power of attorney to your next office visit.  I have ordered your bone density scan.   Judith Flynn , Thank you for taking time to come for your Medicare Wellness Visit. I appreciate your ongoing commitment to your health goals. Please review the following plan we discussed and let me know if I can assist you in the future.   These are the goals we discussed: Goals    . Increase water intake    . Weight (lb) < 135 lb (61.2 kg)       This is a list of the screening recommended for you and due dates:  Health Maintenance  Topic Date Due  . Mammogram  05/28/2019  . Tetanus Vaccine  06/04/2019  . Flu Shot  Completed  . DEXA scan (bone density measurement)  Completed  . Pneumonia vaccines  Completed    Preventive Care 42 Years and Older, Female Preventive care refers to lifestyle choices and visits with your health care provider that can promote health and wellness. This includes:  A yearly physical exam. This is also called an annual well check.  Regular dental and eye exams.  Immunizations.  Screening for certain conditions.  Healthy lifestyle choices, such as diet and exercise. What can I expect for my preventive care visit? Physical exam Your health care provider will check:  Height and weight. These may be used to calculate body mass index (BMI), which is a measurement that tells if you are at a healthy weight.  Heart rate and blood pressure.  Your skin for abnormal spots. Counseling Your health care provider may ask you questions about:  Alcohol, tobacco, and drug use.  Emotional well-being.   Home and relationship well-being.  Sexual activity.  Eating habits.  History of falls.  Memory and ability to understand (cognition).  Work and work Statistician.  Pregnancy and menstrual history. What immunizations do I need?  Influenza (flu) vaccine  This is recommended every year. Tetanus, diphtheria, and pertussis (Tdap) vaccine  You may need a Td booster every 10 years. Varicella (chickenpox) vaccine  You may need this vaccine if you have not already been vaccinated. Zoster (shingles) vaccine  You may need this after age 12. Pneumococcal conjugate (PCV13) vaccine  One dose is recommended after age 65. Pneumococcal polysaccharide (PPSV23) vaccine  One dose is recommended after age 70. Measles, mumps, and rubella (MMR) vaccine  You may need at least one dose of MMR if you were born in 1957 or later. You may also need a second dose. Meningococcal conjugate (MenACWY) vaccine  You may need this if you have certain conditions. Hepatitis A vaccine  You may need this if you have certain conditions or if you travel or work in places where you may be exposed to hepatitis A. Hepatitis B vaccine  You may need this if you have certain conditions or if you travel or work in places where you may be exposed to hepatitis B. Haemophilus influenzae type b (Hib) vaccine  You may need this if you have certain conditions. You may receive vaccines as individual doses or as more than one vaccine together in one shot (combination vaccines). Talk  with your health care provider about the risks and benefits of combination vaccines. What tests do I need? Blood tests  Lipid and cholesterol levels. These may be checked every 5 years, or more frequently depending on your overall health.  Hepatitis C test.  Hepatitis B test. Screening  Lung cancer screening. You may have this screening every year starting at age 103 if you have a 30-pack-year history of smoking and currently smoke or  have quit within the past 15 years.  Colorectal cancer screening. All adults should have this screening starting at age 67 and continuing until age 45. Your health care provider may recommend screening at age 92 if you are at increased risk. You will have tests every 1-10 years, depending on your results and the type of screening test.  Diabetes screening. This is done by checking your blood sugar (glucose) after you have not eaten for a while (fasting). You may have this done every 1-3 years.  Mammogram. This may be done every 1-2 years. Talk with your health care provider about how often you should have regular mammograms.  BRCA-related cancer screening. This may be done if you have a family history of breast, ovarian, tubal, or peritoneal cancers. Other tests  Sexually transmitted disease (STD) testing.  Bone density scan. This is done to screen for osteoporosis. You may have this done starting at age 19. Follow these instructions at home: Eating and drinking  Eat a diet that includes fresh fruits and vegetables, whole grains, lean protein, and low-fat dairy products. Limit your intake of foods with high amounts of sugar, saturated fats, and salt.  Take vitamin and mineral supplements as recommended by your health care provider.  Do not drink alcohol if your health care provider tells you not to drink.  If you drink alcohol: ? Limit how much you have to 0-1 drink a day. ? Be aware of how much alcohol is in your drink. In the [place] one drink equals one 12 oz bottle of beer (355 mL), one 5 oz glass of wine (148 mL), or one 1 oz glass of hard liquor (44 mL). Lifestyle  Take daily care of your teeth and gums.  Stay active. Exercise for at least 30 minutes on 5 or more days each week.  Do not use any products that contain nicotine or tobacco, such as cigarettes, e-cigarettes, and chewing tobacco. If you need help quitting, ask your health care provider.  If you are sexually active,  practice safe sex. Use a condom or other form of protection in order to prevent STIs (sexually transmitted infections).  Talk with your health care provider about taking a low-dose aspirin or statin. What's next?  Go to your health care provider once a year for a well check visit.  Ask your health care provider how often you should have your eyes and teeth checked.  Stay up to date on all vaccines. This information is not intended to replace advice given to you by your health care provider. Make sure you discuss any questions you have with your health care provider. Document Released: 08/06/2015 Document Revised: 07/04/2018 Document Reviewed: 07/04/2018 Elsevier Patient Education  2020 [employer].

## 2019-06-05 ENCOUNTER — Other Ambulatory Visit (HOSPITAL_BASED_OUTPATIENT_CLINIC_OR_DEPARTMENT_OTHER): Payer: Self-pay | Admitting: Family Medicine

## 2019-06-05 DIAGNOSIS — Z1231 Encounter for screening mammogram for malignant neoplasm of breast: Secondary | ICD-10-CM

## 2019-06-27 ENCOUNTER — Other Ambulatory Visit (HOSPITAL_BASED_OUTPATIENT_CLINIC_OR_DEPARTMENT_OTHER): Payer: Medicare Other

## 2019-06-27 ENCOUNTER — Ambulatory Visit (HOSPITAL_BASED_OUTPATIENT_CLINIC_OR_DEPARTMENT_OTHER): Payer: Medicare Other

## 2019-07-08 ENCOUNTER — Ambulatory Visit (HOSPITAL_BASED_OUTPATIENT_CLINIC_OR_DEPARTMENT_OTHER): Payer: Medicare Other

## 2019-07-08 ENCOUNTER — Ambulatory Visit (HOSPITAL_BASED_OUTPATIENT_CLINIC_OR_DEPARTMENT_OTHER)
Admission: RE | Admit: 2019-07-08 | Discharge: 2019-07-08 | Disposition: A | Discharge status: Home / Self Care | Payer: Medicare Other | Source: Ambulatory Visit | Attending: Family Medicine | Admitting: Family Medicine

## 2019-07-08 ENCOUNTER — Other Ambulatory Visit: Payer: Self-pay

## 2019-07-08 ENCOUNTER — Other Ambulatory Visit (HOSPITAL_BASED_OUTPATIENT_CLINIC_OR_DEPARTMENT_OTHER): Payer: Medicare Other

## 2019-07-08 DIAGNOSIS — Z780 Asymptomatic menopausal state: Secondary | ICD-10-CM

## 2019-07-08 DIAGNOSIS — Z1231 Encounter for screening mammogram for malignant neoplasm of breast: Secondary | ICD-10-CM | POA: Diagnosis present

## 2019-07-08 IMAGING — MG DIGITAL SCREENING BILAT W/ TOMO W/ CAD
8 series · 8 of 24 positions shown · non-contrast
Comparison: Previous exam(s).

CLINICAL DATA: Screening.

EXAM:
DIGITAL SCREENING BILATERAL MAMMOGRAM WITH TOMO AND CAD

[L CC synth-2D]
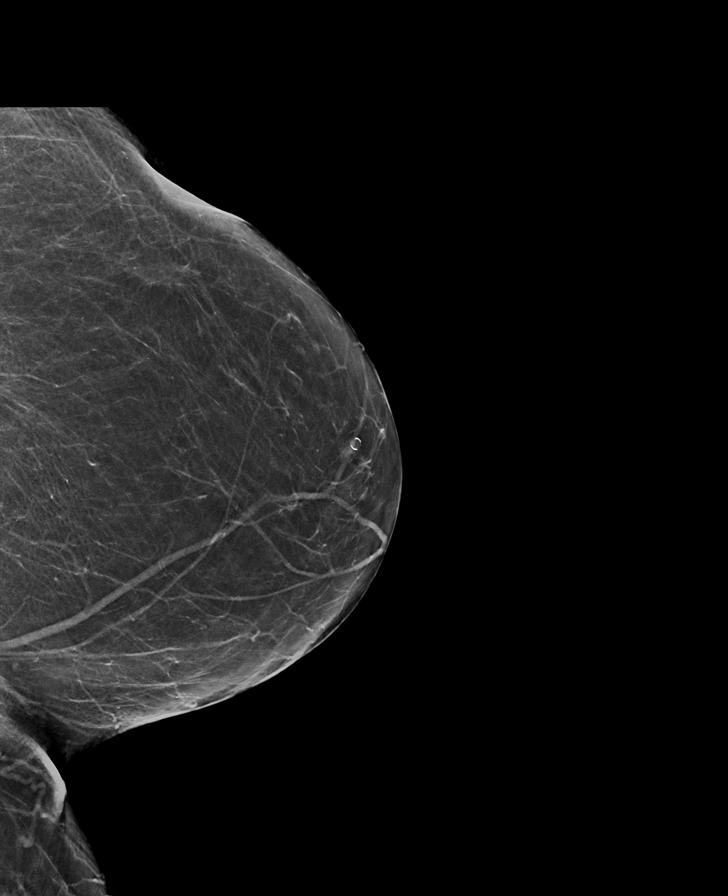

[L MLO synth-2D]
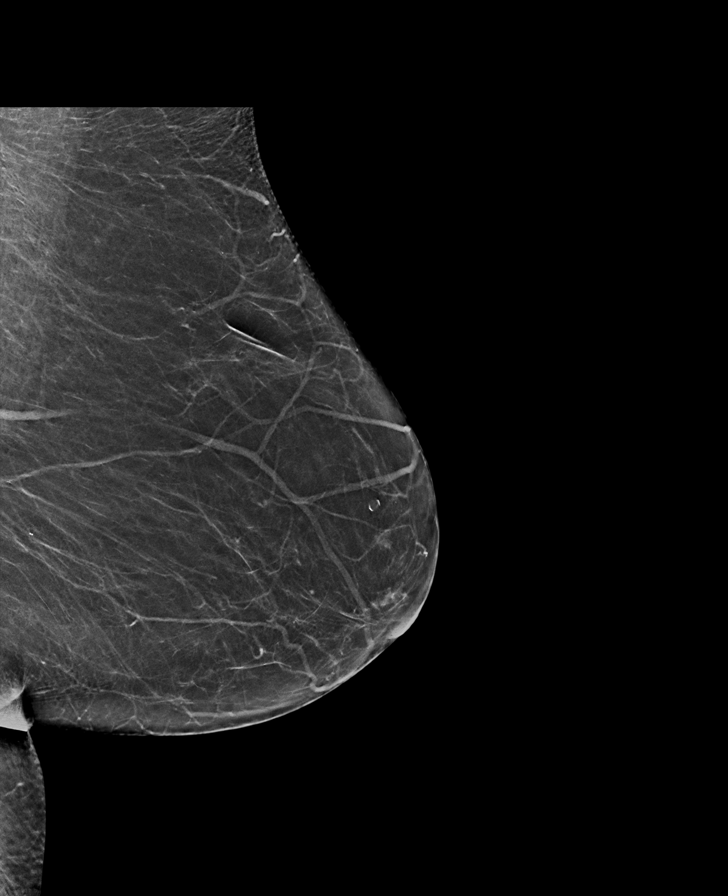

[R MLO synth-2D]
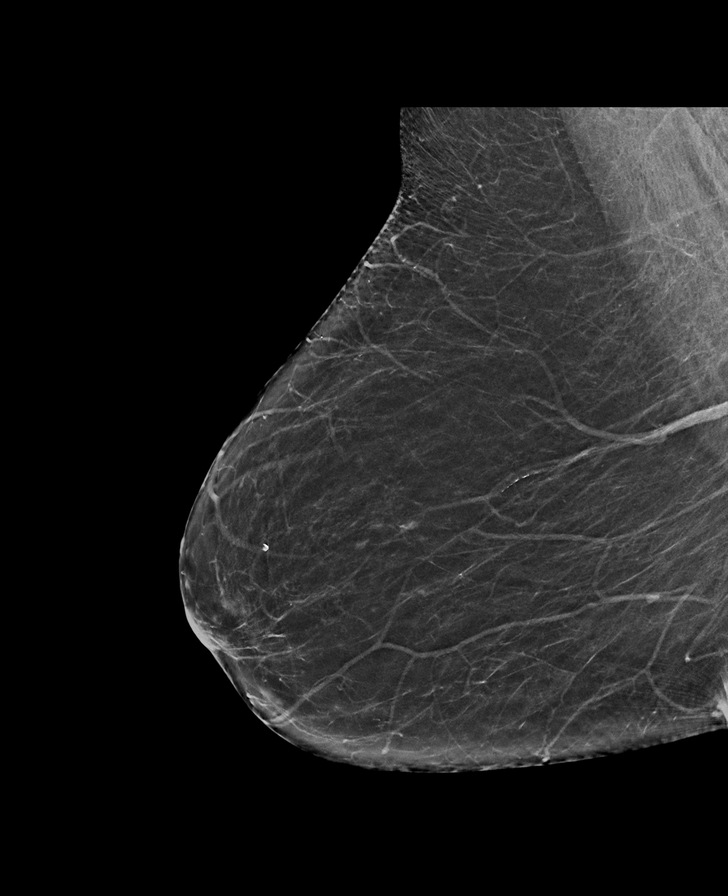

[R CC synth-2D]
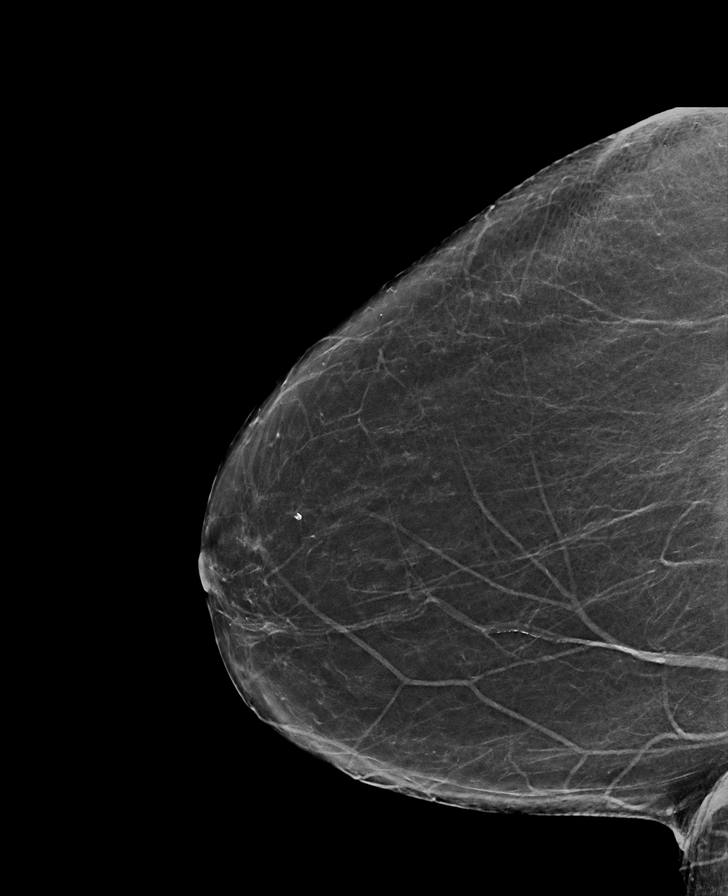

[R CC tomo · tomo slice 31/62.0]
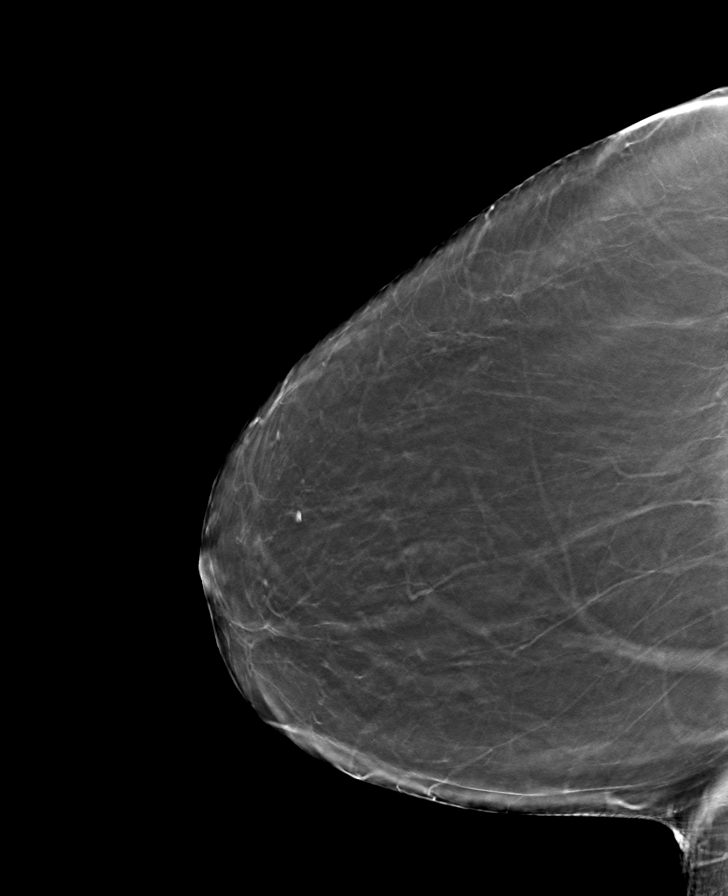

[R MLO tomo · tomo slice 33/64.0]
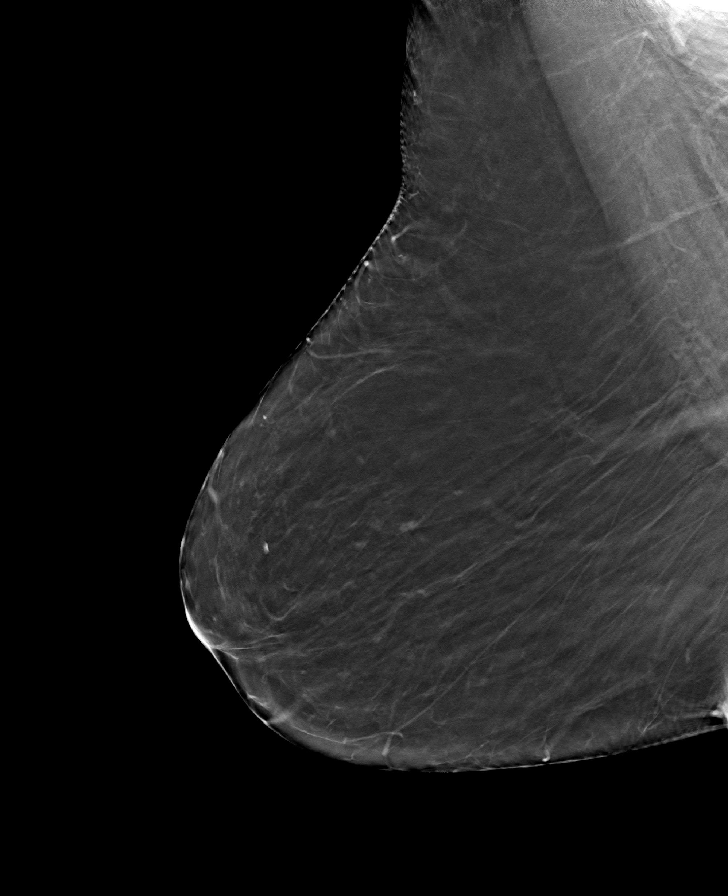

[L MLO tomo · tomo slice 29/58.0]
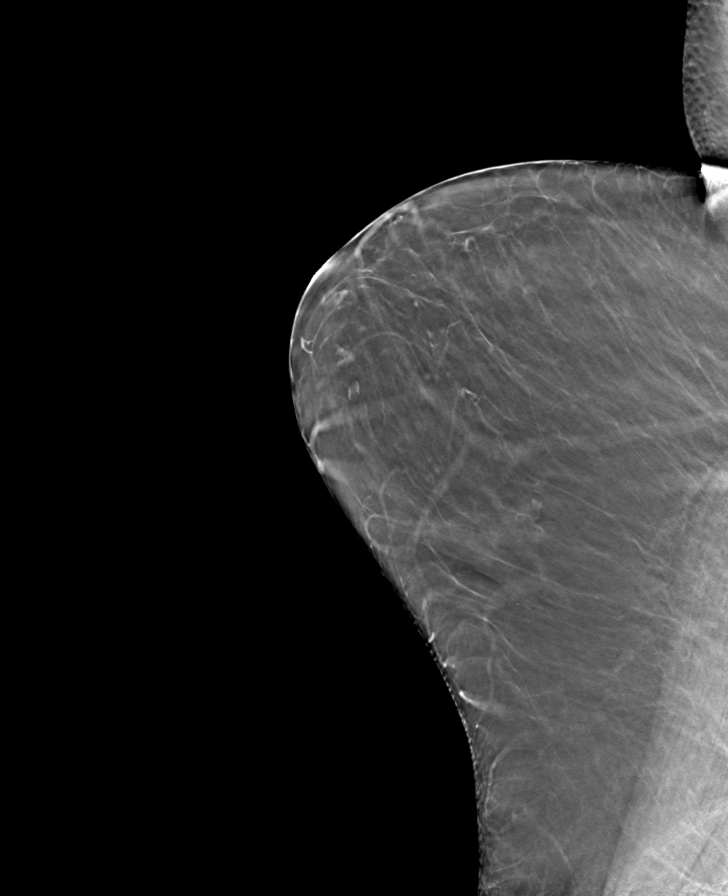

[L CC tomo · tomo slice 29/57.0]
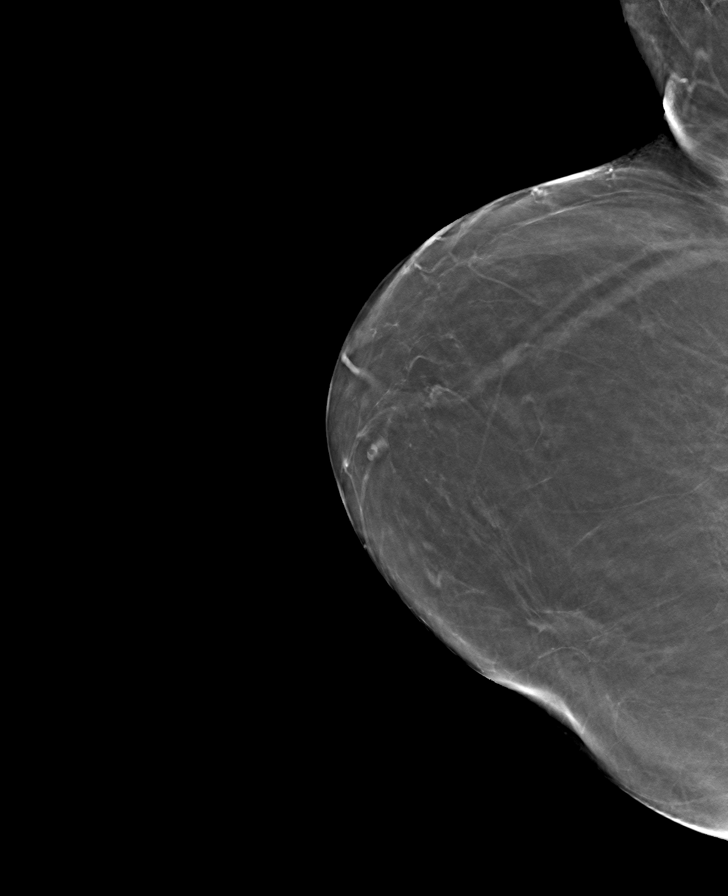

[8 of 24 positions shown; findings below may reference images not displayed]

ACR Breast Density Category b: There are scattered areas of
fibroglandular density.
FINDINGS: There are no findings suspicious for malignancy. Images were
processed with CAD.
IMPRESSION: No mammographic evidence of malignancy. A result letter of this
screening mammogram will be mailed directly to the patient.

RECOMMENDATION:
Screening mammogram in one year. (Code:[id number])

BI-RADS CATEGORY  1: Negative.

## 2019-07-09 ENCOUNTER — Telehealth: Payer: Self-pay | Admitting: Family

## 2019-07-09 NOTE — Telephone Encounter (Signed)
Please advise patient that I reviewed her most recent bone density.  Bone density is still showing osteoporosis.  It appears to be stable overall.  Since she has been doing Reclast for several years I think it would be a good idea for [place] to switch her to a different type of medication I would like her to stop annual Reclast infusion and we will work on getting her authorized for Prolia which is a twice a year injection in the office.  Mel Almond can you please work on [employer] authorization?

## 2019-07-09 NOTE — Telephone Encounter (Signed)
Patient advised of results. She verbalized understanding. Was due for reclast infusion this Friday. She will hold off on that until she hears from [place] about the prolia. Authorization.

## 2019-07-10 NOTE — Telephone Encounter (Signed)
Per Danise Mina. Prolia authorization in progress.

## 2019-07-11 ENCOUNTER — Inpatient Hospital Stay: Payer: Medicare Other

## 2019-07-11 ENCOUNTER — Encounter: Payer: Self-pay | Admitting: Hematology & Oncology

## 2019-07-11 ENCOUNTER — Other Ambulatory Visit: Payer: Self-pay

## 2019-07-11 ENCOUNTER — Inpatient Hospital Stay: Payer: Medicare Other | Attending: Hematology & Oncology | Admitting: Hematology & Oncology

## 2019-07-11 VITALS — BP 142/65 | HR 84 | Temp 97.1°F | Resp 16 | Ht 59.0 in | Wt 144.0 lb

## 2019-07-11 DIAGNOSIS — M810 Age-related osteoporosis without current pathological fracture: Secondary | ICD-10-CM

## 2019-07-11 DIAGNOSIS — D0512 Intraductal carcinoma in situ of left breast: Secondary | ICD-10-CM | POA: Diagnosis present

## 2019-07-11 DIAGNOSIS — Z853 Personal history of malignant neoplasm of breast: Secondary | ICD-10-CM

## 2019-07-11 DIAGNOSIS — M8000XA Age-related osteoporosis with current pathological fracture, unspecified site, initial encounter for fracture: Secondary | ICD-10-CM

## 2019-07-11 DIAGNOSIS — Z79899 Other long term (current) drug therapy: Secondary | ICD-10-CM | POA: Diagnosis not present

## 2019-07-11 LAB — CBC WITH DIFFERENTIAL (CANCER CENTER ONLY)
Abs Immature Granulocytes: 0.01 10*3/uL (ref 0.00–0.07)
Basophils Absolute: 0.1 10*3/uL (ref 0.0–0.1)
Basophils Relative: 1 %
Eosinophils Absolute: 0 10*3/uL (ref 0.0–0.5)
Eosinophils Relative: 1 %
HCT: 40.9 % (ref 36.0–46.0)
Hemoglobin: 13.4 g/dL (ref 12.0–15.0)
Immature Granulocytes: 0 %
Lymphocytes Relative: 28 %
Lymphs Abs: 1.5 10*3/uL (ref 0.7–4.0)
MCH: 30.7 pg (ref 26.0–34.0)
MCHC: 32.8 g/dL (ref 30.0–36.0)
MCV: 93.8 fL (ref 80.0–100.0)
Monocytes Absolute: 0.4 10*3/uL (ref 0.1–1.0)
Monocytes Relative: 7 %
Neutro Abs: 3.5 10*3/uL (ref 1.7–7.7)
Neutrophils Relative %: 63 %
Platelet Count: 196 10*3/uL (ref 150–400)
RBC: 4.36 MIL/uL (ref 3.87–5.11)
RDW: 12.5 % (ref 11.5–15.5)
WBC Count: 5.5 10*3/uL (ref 4.0–10.5)
nRBC: 0 % (ref 0.0–0.2)

## 2019-07-11 LAB — CMP (CANCER CENTER ONLY)
ALT: 9 U/L (ref 0–44)
AST: 12 U/L — ABNORMAL LOW (ref 15–41)
Albumin: 4.5 g/dL (ref 3.5–5.0)
Alkaline Phosphatase: 60 U/L (ref 38–126)
Anion gap: 9 (ref 5–15)
BUN: 16 mg/dL (ref 8–23)
CO2: 27 mmol/L (ref 22–32)
Calcium: 9.4 mg/dL (ref 8.9–10.3)
Chloride: 106 mmol/L (ref 98–111)
Creatinine: 0.93 mg/dL (ref 0.44–1.00)
GFR, Est AFR Am: >60 mL/min (ref >60)
GFR, Estimated: 58 mL/min — ABNORMAL LOW (ref >60)
Glucose, Bld: 114 mg/dL — ABNORMAL HIGH (ref 70–99)
Potassium: 4.2 mmol/L (ref 3.5–5.1)
Sodium: 142 mmol/L (ref 135–145)
Total Bilirubin: 0.5 mg/dL (ref 0.3–1.2)
Total Protein: 6.8 g/dL (ref 6.5–8.1)

## 2019-07-11 MED ORDER — SODIUM CHLORIDE 0.9 % IV SOLN
Freq: Once | INTRAVENOUS | Status: AC
  Filled 2019-07-11: qty 250

## 2019-07-11 MED ORDER — ZOLEDRONIC ACID 4 MG/100ML IV SOLN
4.0000 mg | Freq: Once | INTRAVENOUS | Status: AC
  Administered 2019-07-11: 4 mg via INTRAVENOUS
  Filled 2019-07-11: qty 100

## 2019-07-11 NOTE — Progress Notes (Signed)
Hematology and Oncology Follow Up Visit  Judith Flynn [id number] 01/25/1941 78 y.o. 07/11/2019   Principle Diagnosis:  Ductal carcinoma in situ of the left breast Osteoporosis  Current Therapy:   Zometa 5 mg IV q yr -  D/c on 07/11/2019 Prolia 60 mg IM q 6 months -- start on 06/2020   Interim History:  Judith Flynn is here today for follow-up and Zometa infusion.  She is doing pretty good.  She talk to her sister yesterday who lives up in [place].  She said that she had over 5 feet of snow from the big snowstorm that they had.  Because of the coronavirus, Judith Flynn really was not able to do all that much.  She is not able to travel.  She has had no problems with pain.  She has had no issues with nausea or vomiting.  There has been no change in bowel or bladder habits.  She has had no change in medications.  She would like to see about getting started on Prolia.  I think this would be reasonable.  We will do her Zometa today and then will see her back in a year, then go to the intramuscular Prolia injections.  Her last mammogram was done on 07/08/2019.  This all looked fine.  She has had no rashes.  There is been no leg swelling.  She has had no headaches.  She has had no cough or shortness of breath.  Overall, her performance status is ECOG 1.   Medications:  Allergies as of 07/11/2019      Reactions   Bupropion    tremors   Lisinopril    REACTION: cough      Medication List       Accurate as of July 11, 2019 12:10 PM. If you have any questions, ask your nurse or doctor.        acetaminophen 325 MG tablet Commonly known as: TYLENOL Take 650 mg by mouth every 6 (six) hours as needed.   aspirin 81 MG tablet Take 81 mg by mouth daily.   atorvastatin 40 MG tablet Commonly known as: LIPITOR TAKE 1 TABLET BY MOUTH  DAILY   Calcium 1200 1200-1000 MG-UNIT Chew Chew 1 tablet by mouth daily.   hydrochlorothiazide 25 MG tablet Commonly known as:  HYDRODIURIL TAKE 1 TABLET BY MOUTH  DAILY   omeprazole 20 MG capsule Commonly known as: PRILOSEC TAKE 1 CAPSULE BY MOUTH TWO TIMES DAILY   PreserVision AREDS 2 Caps Take 1 capsule by mouth daily.   Reclast 5 MG/100ML Soln injection Generic drug: zoledronic acid Inject 5 mg into the vein. Annual infusion   varenicline 0.5 MG X 11 & 1 MG X 42 tablet Commonly known as: Chantix Starting Month Pak one 0.5 mg tab by mouth 1 x  daily for 3 days, then increase to one 0.5 mg tablet 2 x daily for 4 days, then increase to one 1 mg tab bid   Chantix Continuing Month Pak 1 MG tablet Generic drug: varenicline TAKE ONE (1) TABLET BY MOUTH TWO (2) TIMES DAILY   Vitamin D3 75 MCG (3000 UT) Tabs Take 1 tablet by mouth daily.       Allergies:  Allergies  Allergen Reactions  . Bupropion     tremors  . Lisinopril     REACTION: cough    Past Medical History, Surgical history, Social history, and Family History were reviewed and updated.  Review of Systems: Review of Systems  Constitutional: Negative.  HENT: Negative.   Eyes: Negative.   Respiratory: Negative.   Cardiovascular: Negative.   Gastrointestinal: Negative.   Genitourinary: Negative.   Musculoskeletal: Negative.   Skin: Negative.   Neurological: Negative.   Endo/Heme/Allergies: Negative.   Psychiatric/Behavioral: Negative.      Physical Exam:  height is 4\' 11"  (1.499 m) and weight is 144 lb (65.3 kg). Her temporal temperature is 97.1 F (36.2 C) (abnormal). Her blood pressure is 142/65 (abnormal) and her pulse is 84. Her respiration is 16 and oxygen saturation is 97%.   Wt Readings from Last 3 Encounters:  07/11/19 144 lb (65.3 kg)  06/03/19 142 lb (64.4 kg)  02/25/19 144 lb 3.2 oz (65.4 kg)    Physical Exam Vitals reviewed.  Constitutional:      Comments: Breast exam shows right breast with no masses, edema or erythema.  There is no right axillary adenopathy.  Left breast is slightly contracted from  lumpectomy and radiation.  She has the lumpectomy scar at about the 1 o'clock position.  There is some firmness at the lumpectomy site.  There is no left axillary adenopathy.  HENT:     Head: Normocephalic and atraumatic.  Eyes:     Pupils: Pupils are equal, round, and reactive to light.  Cardiovascular:     Rate and Rhythm: Normal rate and regular rhythm.     Heart sounds: Normal heart sounds.  Pulmonary:     Effort: Pulmonary effort is normal.     Breath sounds: Normal breath sounds.  Abdominal:     General: Bowel sounds are normal.     Palpations: Abdomen is soft.  Musculoskeletal:        General: No tenderness or deformity. Normal range of motion.     Cervical back: Normal range of motion.  Lymphadenopathy:     Cervical: No cervical adenopathy.  Skin:    General: Skin is warm and dry.     Findings: No erythema or rash.  Neurological:     Mental Status: She is alert and oriented to person, place, and time.  Psychiatric:        Behavior: Behavior normal.        Thought Content: Thought content normal.        Judgment: Judgment normal.      Lab Results  Component Value Date   WBC 5.5 07/11/2019   HGB 13.4 07/11/2019   HCT 40.9 07/11/2019   MCV 93.8 07/11/2019   PLT 196 07/11/2019   No results found for: FERRITIN, IRON, TIBC, UIBC, IRONPCTSAT Lab Results  Component Value Date   RBC 4.36 07/11/2019   No results found for: KPAFRELGTCHN, LAMBDASER, KAPLAMBRATIO No results found for: IGGSERUM, IGA, IGMSERUM No results found for: Odetta Pink, SPEI   Chemistry      Component Value Date/Time   NA 142 07/11/2019 1124   NA 146 (H) 06/27/2017 0903   K 4.2 07/11/2019 1124   K 3.9 06/27/2017 0903   CL 106 07/11/2019 1124   CL 106 06/27/2017 0903   CO2 27 07/11/2019 1124   CO2 27 06/27/2017 0903   BUN 16 07/11/2019 1124   BUN 11 06/27/2017 0903   CREATININE 0.93 07/11/2019 1124   CREATININE 0.8 06/27/2017 0903       Component Value Date/Time   CALCIUM 9.4 07/11/2019 1124   CALCIUM 9.2 06/27/2017 0903   ALKPHOS 60 07/11/2019 1124   ALKPHOS 68 06/27/2017 0903   AST 12 (L) 07/11/2019 1124  ALT 9 07/11/2019 1124   ALT 16 06/27/2017 0903   BILITOT 0.5 07/11/2019 1124       Impression and Plan: Judith Flynn is a very pleasant 78  yo caucasian female with history of ductal carcinoma in situ of the left breast almost 20 years ago.  Her 78th birthday is actually coming up next week.  I am sure that she will have a nice celebration, although it will be a Advertising account planner.  I just do not think there is good to be any issues with respect to invasive carcinoma of the breast.  She will get her Zometa today.  We will then get her back in 1 year at which point she will then get her Prolia.     Volanda Napoleon, MD 12/18/202012:10 PM

## 2019-07-11 NOTE — Patient Instructions (Signed)

## 2019-07-13 ENCOUNTER — Telehealth: Payer: Self-pay | Admitting: Family

## 2019-07-13 NOTE — Telephone Encounter (Signed)
Rod Holler- Please advise pt that I reviewed Dr. Antonieta Pert notes and see that he is also giving her the Reclast due to her breast cancer history.  I will not change her to prolia for this reason.  Mel Almond- please cancel Prolia authorization. Thanks.

## 2019-07-14 NOTE — Telephone Encounter (Signed)
Canceled.

## 2019-07-14 NOTE — Telephone Encounter (Signed)
Patient advised order for prolia was cancelled. She had reclast lat Friday.

## 2019-08-29 ENCOUNTER — Other Ambulatory Visit: Payer: Self-pay

## 2019-08-29 ENCOUNTER — Encounter: Payer: Self-pay | Admitting: Family

## 2019-08-29 ENCOUNTER — Ambulatory Visit (INDEPENDENT_AMBULATORY_CARE_PROVIDER_SITE_OTHER): Payer: Medicare Other | Admitting: Family

## 2019-08-29 VITALS — BP 144/69 | HR 87 | Temp 97.3°F | Resp 16 | Ht 59.0 in | Wt 146.0 lb

## 2019-08-29 DIAGNOSIS — E785 Hyperlipidemia, unspecified: Secondary | ICD-10-CM

## 2019-08-29 DIAGNOSIS — F17200 Nicotine dependence, unspecified, uncomplicated: Secondary | ICD-10-CM | POA: Diagnosis not present

## 2019-08-29 DIAGNOSIS — I10 Essential (primary) hypertension: Secondary | ICD-10-CM | POA: Diagnosis not present

## 2019-08-29 MED ORDER — BUPROPION HCL ER (XL) 150 MG PO TB24: 150.0000 mg | ORAL_TABLET | Freq: Every day | ORAL | 1 refills | Status: DC

## 2019-08-29 MED ORDER — VARENICLINE TARTRATE 1 MG PO TABS: 1.0000 mg | ORAL_TABLET | Freq: Two times a day (BID) | ORAL | 0 refills | Status: DC

## 2019-08-29 NOTE — Patient Instructions (Signed)
Continue chantix twice daily for the next 1 month. Begin Wellbutrin (Buproprion) once daily.

## 2019-08-29 NOTE — Progress Notes (Signed)
Subjective:    Patient ID: Judith Flynn, female    DOB: 1940-12-21, 79 y.o.   MRN: [id number]  HPI  Patient is an 79 year old female who presents today for routine follow-up.  Hypertension- maintained on hctz 25mg .  BP Readings from Last 3 Encounters:  07/11/19 (!) 142/65  06/03/19 127/86  02/25/19 130/69   Hyperlipidemia-maintained on lipitor 40mg .  Lab Results  Component Value Date   CHOL 154 02/25/2019   HDL 57.00 02/25/2019   LDLCALC 76 02/25/2019   LDLDIRECT 100.0 02/19/2015   TRIG 104.0 02/25/2019   CHOLHDL 3 02/25/2019   Tobacco abuse-she reports that she is smoking about 3 cigarettes a day.  She continues Chantix.  Feels like it takes the edge off of her cravings.  She would like to continue Chantix.    Review of Systems See HPI  Past Medical History:  Diagnosis Date  . Cancer [facility]) 2000   breast- left breast- s/p lumpectomy and radiation  . Colon polyp   . Hearing loss   . Hyperlipidemia   . Hypertension   . Osteoporosis   . Tobacco abuse      Social History   Socioeconomic History  . Marital status: Widowed    Spouse name: Not on file  . Number of children: 3  . Years of education: Not on file  . Highest education level: Not on file  Occupational History  . Occupation: retired    Fish farm manager: RETIRED  Tobacco Use  . Smoking status: Current Some Day Smoker    Packs/day: 0.50    Types: Cigarettes  . Smokeless tobacco: Never Used  . Tobacco comment: 05-22-14  smokes daily  Substance and Sexual Activity  . Alcohol use: Yes    Alcohol/week: 0.0 standard drinks    Comment: Rare  . Drug use: No  . Sexual activity: Yes  Other Topics Concern  . Not on file  Social History Narrative   Widow/ widower   Regular exercise- yes   Originally from [place] Strain:   . Difficulty of Paying Living Expenses: Not on file  Food Insecurity:   . Worried About Charity fundraiser in the Last  Year: Not on file  . Ran Out of Food in the Last Year: Not on file  Transportation Needs:   . Lack of Transportation (Medical): Not on file  . Lack of Transportation (Non-Medical): Not on file  Physical Activity:   . Days of Exercise per Week: Not on file  . Minutes of Exercise per Session: Not on file  Stress:   . Feeling of Stress : Not on file  Social Connections:   . Frequency of Communication with Friends and Family: Not on file  . Frequency of Social Gatherings with Friends and Family: Not on file  . Attends Religious Services: Not on file  . Active Member of Clubs or Organizations: Not on file  . Attends Archivist Meetings: Not on file  . Marital Status: Not on file  Intimate Partner Violence:   . Fear of Current or Ex-Partner: Not on file  . Emotionally Abused: Not on file  . Physically Abused: Not on file  . Sexually Abused: Not on file    Past Surgical History:  Procedure Laterality Date  . BREAST BIOPSY Left 2000  . BREAST LUMPECTOMY Left 2000   left  . CATARACT EXTRACTION, BILATERAL Bilateral July and August 2015  .  CHOLECYSTECTOMY    . surgery on ear drum     right    Family History  Problem Relation Age of Onset  . Alcohol abuse Mother   . Arthritis Mother   . Hypertension Mother 37  . Heart disease Mother        CHF  . Alcohol abuse Father   . Arthritis Father   . Hypertension Father 67  . Heart attack Father        Died of MI at age 87    Allergies  Allergen Reactions  . Bupropion     tremors  . Lisinopril     REACTION: cough    Current Outpatient Medications on File Prior to Visit  Medication Sig Dispense Refill  . acetaminophen (TYLENOL) 325 MG tablet Take 650 mg by mouth every 6 (six) hours as needed.      Marland Kitchen aspirin 81 MG tablet Take 81 mg by mouth daily.    Marland Kitchen atorvastatin (LIPITOR) 40 MG tablet TAKE 1 TABLET BY MOUTH  DAILY 90 tablet 1  . Calcium Carbonate-Vit D-Min (CALCIUM 1200) 1200-1000 MG-UNIT CHEW Chew 1 tablet by  mouth daily.     . CHANTIX CONTINUING MONTH PAK 1 MG tablet TAKE ONE (1) TABLET BY MOUTH TWO (2) TIMES DAILY 56 tablet 1  . Cholecalciferol (VITAMIN D3) 3000 units TABS Take 1 tablet by mouth daily. 30 tablet   . hydrochlorothiazide (HYDRODIURIL) 25 MG tablet TAKE 1 TABLET BY MOUTH  DAILY 90 tablet 1  . Multiple Vitamins-Minerals (PRESERVISION AREDS 2) CAPS Take 1 capsule by mouth daily.    Marland Kitchen omeprazole (PRILOSEC) 20 MG capsule TAKE 1 CAPSULE BY MOUTH TWO TIMES DAILY 180 capsule 1  . varenicline (CHANTIX STARTING MONTH PAK) 0.5 MG X 11 & 1 MG X 42 tablet one 0.5 mg tab by mouth 1 x  daily for 3 days, then increase to one 0.5 mg tablet 2 x daily for 4 days, then increase to one 1 mg tab bid 53 tablet 0  . zoledronic acid (RECLAST) 5 MG/100ML SOLN Inject 5 mg into the vein. Annual infusion     No current facility-administered medications on file prior to visit.    BP (!) 144/69 (BP Location: Right Arm, Patient Position: Sitting, Cuff Size: Small)   Pulse 87   Temp (!) 97.3 F (36.3 C) (Temporal)   Resp 16   Ht 4\' 11"  (1.499 m)   Wt 146 lb (66.2 kg)   SpO2 97%   BMI 29.49 kg/m       Objective:   Physical Exam Constitutional:      Appearance: She is well-developed.  Neck:     Thyroid: No thyromegaly.  Cardiovascular:     Rate and Rhythm: Normal rate and regular rhythm.     Heart sounds: Normal heart sounds. No murmur.  Pulmonary:     Effort: Pulmonary effort is normal. No respiratory distress.     Breath sounds: Normal breath sounds. No wheezing.  Musculoskeletal:     Cervical back: Neck supple.  Skin:    General: Skin is warm and dry.  Neurological:     Mental Status: She is alert and oriented to person, place, and time.  Psychiatric:        Behavior: Behavior normal.        Thought Content: Thought content normal.        Judgment: Judgment normal.           Assessment & Plan:  Tobacco abuse-advised patient  that Chantix is not a medication that we can continue  long-term however I will give her an additional month supply.  She has used bupropion in the past at 300 mg daily however it caused her some tremor at this dose.  She is open to retrying bupropion at a lower dose.  If she tolerates this this may help transition her off of Chantix.  Plan a follow-up visit in 4 to 6 weeks.  Hypertension-blood pressure looks good on current dose of hydrochlorothiazide.  Continue same.  Reviewed CMP which was performed back in December at the cancer center.  Hyperlipidemia-LDL at goal on statin.  Continue same.  30 minutes spent on today's visit.  This visit occurred during the SARS-CoV-2 public health emergency.  Safety protocols were in place, including screening questions prior to the visit, additional usage of staff PPE, and extensive cleaning of exam room while observing appropriate contact time as indicated for disinfecting solutions.

## 2019-10-13 ENCOUNTER — Other Ambulatory Visit: Payer: Self-pay

## 2019-10-13 ENCOUNTER — Encounter: Payer: Self-pay | Admitting: Family

## 2019-10-13 ENCOUNTER — Ambulatory Visit (INDEPENDENT_AMBULATORY_CARE_PROVIDER_SITE_OTHER): Payer: Medicare Other | Admitting: Family

## 2019-10-13 DIAGNOSIS — Z720 Tobacco use: Secondary | ICD-10-CM

## 2019-10-13 DIAGNOSIS — K219 Gastro-esophageal reflux disease without esophagitis: Secondary | ICD-10-CM

## 2019-10-13 MED ORDER — OMEPRAZOLE 40 MG PO CPDR: 40.0000 mg | DELAYED_RELEASE_CAPSULE | Freq: Every day | ORAL | 1 refills | Status: DC

## 2019-10-13 MED ORDER — BUPROPION HCL ER (XL) 150 MG PO TB24: 150.0000 mg | ORAL_TABLET | Freq: Every day | ORAL | 1 refills | Status: DC

## 2019-10-13 NOTE — Progress Notes (Signed)
Virtual Visit via Telephone Note  I connected with [name] on 10/13/19 at  9:00 AM EDT by telephone and verified that I am speaking with the correct person using two identifiers.  Location: Patient: home Provider: home   I discussed the limitations, risks, security and privacy concerns of performing an evaluation and management service by telephone and the availability of in person appointments. I also discussed with the patient that there may be a patient responsible charge related to this service. The patient expressed understanding and agreed to proceed.   History of Present Illness:  Patient is an 79 yr old female who presents today for follow up.  Last visit we discussed smoking cessation. She felt that chantix was helping her cigarette cravings. However, we discussed that she could not take this long term.  Instead, we added wellbutrin and 150mg  once daily.  Previously she had a tremor on the 300mg  dose.   She reports that she is doing well on Wellbutrin. She reports no tremor. Mood is actually improved. Denies any tremor side effect on this dose. She is no longer taking chantix.  Smoking 3 cigarettes/day. Past Medical History:  Diagnosis Date  . Cancer [facility]) 2000   breast- left breast- s/p lumpectomy and radiation  . Colon polyp   . Hearing loss   . Hyperlipidemia   . Hypertension   . Osteoporosis   . Tobacco abuse      Social History   Socioeconomic History  . Marital status: Widowed    Spouse name: Not on file  . Number of children: 3  . Years of education: Not on file  . Highest education level: Not on file  Occupational History  . Occupation: retired    Fish farm manager: RETIRED  Tobacco Use  . Smoking status: Current Some Day Smoker    Packs/day: 0.50    Types: Cigarettes  . Smokeless tobacco: Never Used  . Tobacco comment: 05-22-14  smokes daily  Substance and Sexual Activity  . Alcohol use: Yes    Alcohol/week: 0.0 standard drinks    Comment: Rare  .  Drug use: No  . Sexual activity: Yes  Other Topics Concern  . Not on file  Social History Narrative   Widow/ widower   Regular exercise- yes   Originally from [place] Strain:   . Difficulty of Paying Living Expenses:   Food Insecurity:   . Worried About Charity fundraiser in the Last Year:   . Arboriculturist in the Last Year:   Transportation Needs:   . Film/video editor (Medical):   Marland Kitchen Lack of Transportation (Non-Medical):   Physical Activity:   . Days of Exercise per Week:   . Minutes of Exercise per Session:   Stress:   . Feeling of Stress :   Social Connections:   . Frequency of Communication with Friends and Family:   . Frequency of Social Gatherings with Friends and Family:   . Attends Religious Services:   . Active Member of Clubs or Organizations:   . Attends Archivist Meetings:   Marland Kitchen Marital Status:   Intimate Partner Violence:   . Fear of Current or Ex-Partner:   . Emotionally Abused:   Marland Kitchen Physically Abused:   . Sexually Abused:     Past Surgical History:  Procedure Laterality Date  . BREAST BIOPSY Left 2000  . BREAST LUMPECTOMY Left 2000  left  . CATARACT EXTRACTION, BILATERAL Bilateral July and August 2015  . CHOLECYSTECTOMY    . surgery on ear drum     right    Family History  Problem Relation Age of Onset  . Alcohol abuse Mother   . Arthritis Mother   . Hypertension Mother 48  . Heart disease Mother        CHF  . Alcohol abuse Father   . Arthritis Father   . Hypertension Father 68  . Heart attack Father        Died of MI at age 41    Allergies  Allergen Reactions  . Bupropion     tremors  . Lisinopril     REACTION: cough    Current Outpatient Medications on File Prior to Visit  Medication Sig Dispense Refill  . acetaminophen (TYLENOL) 325 MG tablet Take 650 mg by mouth every 6 (six) hours as needed.      Marland Kitchen aspirin 81 MG tablet Take 81 mg by mouth daily.     Marland Kitchen atorvastatin (LIPITOR) 40 MG tablet TAKE 1 TABLET BY MOUTH  DAILY 90 tablet 1  . Calcium Carbonate-Vit D-Min (CALCIUM 1200) 1200-1000 MG-UNIT CHEW Chew 1 tablet by mouth daily.     . CHANTIX CONTINUING MONTH PAK 1 MG tablet TAKE ONE (1) TABLET BY MOUTH TWO (2) TIMES DAILY 56 tablet 1  . Cholecalciferol (VITAMIN D3) 3000 units TABS Take 1 tablet by mouth daily. 30 tablet   . hydrochlorothiazide (HYDRODIURIL) 25 MG tablet TAKE 1 TABLET BY MOUTH  DAILY 90 tablet 1  . Multiple Vitamins-Minerals (PRESERVISION AREDS 2) CAPS Take 1 capsule by mouth daily.    . zoledronic acid (RECLAST) 5 MG/100ML SOLN Inject 5 mg into the vein. Annual infusion     No current facility-administered medications on file prior to visit.    There were no vitals taken for this visit.     Observations/Objective:   Gen: Awake, alert, no acute distress Resp: Breathing sounds even and non-labored Psych: calm/pleasant demeanor Neuro: Alert and Oriented x 3, speech sounds clear.  Assessment and Plan:  Tobacco abuse- we discussed cutting down to 2 cigarettes/day for 1-2 weeks, then 1 cigarette/day for 1-2 weeks then stop.   Gerd- stable on omeprazole. She is currently taking 20mg  bid. Will change to 40mg  once daily for ease of administration.   Follow Up Instructions:    I discussed the assessment and treatment plan with the patient. The patient was provided an opportunity to ask questions and all were answered. The patient agreed with the plan and demonstrated an understanding of the instructions.   The patient was advised to call back or seek an in-person evaluation if the symptoms worsen or if the condition fails to improve as anticipated.  I provided 15 minutes of non-face-to-face time during this encounter.   Nance Pear, NP \

## 2020-01-01 DIAGNOSIS — H35033 Hypertensive retinopathy, bilateral: Secondary | ICD-10-CM | POA: Diagnosis not present

## 2020-01-01 DIAGNOSIS — H3540 Unspecified peripheral retinal degeneration: Secondary | ICD-10-CM | POA: Diagnosis not present

## 2020-01-01 DIAGNOSIS — H353132 Nonexudative age-related macular degeneration, bilateral, intermediate dry stage: Secondary | ICD-10-CM | POA: Diagnosis not present

## 2020-01-01 DIAGNOSIS — H43823 Vitreomacular adhesion, bilateral: Secondary | ICD-10-CM | POA: Diagnosis not present

## 2020-03-02 ENCOUNTER — Other Ambulatory Visit: Payer: Self-pay

## 2020-03-02 ENCOUNTER — Encounter: Payer: Self-pay | Admitting: Family Medicine

## 2020-03-02 ENCOUNTER — Ambulatory Visit (INDEPENDENT_AMBULATORY_CARE_PROVIDER_SITE_OTHER): Payer: Medicare Other | Admitting: Family Medicine

## 2020-03-02 VITALS — BP 156/65 | HR 78 | Temp 98.3°F | Resp 18 | Ht 59.0 in | Wt 149.2 lb

## 2020-03-02 DIAGNOSIS — B353 Tinea pedis: Secondary | ICD-10-CM

## 2020-03-02 DIAGNOSIS — Z23 Encounter for immunization: Secondary | ICD-10-CM | POA: Diagnosis not present

## 2020-03-02 DIAGNOSIS — S90821A Blister (nonthermal), right foot, initial encounter: Secondary | ICD-10-CM

## 2020-03-02 MED ORDER — CLOTRIMAZOLE-BETAMETHASONE 1-0.05 % EX CREA: 1.0000 "application " | TOPICAL_CREAM | Freq: Two times a day (BID) | CUTANEOUS | 0 refills | Status: DC

## 2020-03-02 MED ORDER — DOXYCYCLINE HYCLATE 100 MG PO TABS: 100.0000 mg | ORAL_TABLET | Freq: Two times a day (BID) | ORAL | 0 refills | Status: DC

## 2020-03-02 NOTE — Assessment & Plan Note (Signed)
lotrisone cream Skin very red and irritated  abx sent in as well  Td updated

## 2020-03-02 NOTE — Progress Notes (Signed)
Patient ID: Judith Flynn, female    DOB: May 03, 1941  Age: 79 y.o. MRN: [id number]    Subjective:  Subjective  HPI Judith Flynn presents for blister and rash on foot x 1 month-- she has used neosporin with little relief.  It was itchy--- the blister burst after a pedicure.  She said it has improved some   She has no idea exactly how long its been or what caused it   + itchy and some pain   Review of Systems  Constitutional: Negative for appetite change, diaphoresis, fatigue and unexpected weight change.  Eyes: Negative for pain, redness and visual disturbance.  Respiratory: Negative for cough, chest tightness, shortness of breath and wheezing.   Cardiovascular: Negative for chest pain, palpitations and leg swelling.  Endocrine: Negative for cold intolerance, heat intolerance, polydipsia, polyphagia and polyuria.  Genitourinary: Negative for difficulty urinating, dysuria and frequency.  Skin: Positive for color change, rash and wound.  Neurological: Negative for dizziness, light-headedness, numbness and headaches.    History Past Medical History:  Diagnosis Date  . Cancer [facility]) 2000   breast- left breast- s/p lumpectomy and radiation  . Colon polyp   . Hearing loss   . Hyperlipidemia   . Hypertension   . Osteoporosis   . Tobacco abuse     She has a past surgical history that includes surgery on ear drum; Cataract extraction, bilateral (Bilateral, July and August 2015); Cholecystectomy; Breast biopsy (Left, 2000); and Breast lumpectomy (Left, 2000).   Her family history includes Alcohol abuse in her father and mother; Arthritis in her father and mother; Heart attack in her father; Heart disease in her mother; Hypertension (age of onset: 57) in her father and mother.She reports that she has been smoking cigarettes. She has been smoking about 0.50 packs per day. She has never used smokeless tobacco. She reports current alcohol use. She reports that she does not use  drugs.  Current Outpatient Medications on File Prior to Visit  Medication Sig Dispense Refill  . acetaminophen (TYLENOL) 325 MG tablet Take 650 mg by mouth every 6 (six) hours as needed.      Marland Kitchen aspirin 81 MG tablet Take 81 mg by mouth daily.    Marland Kitchen atorvastatin (LIPITOR) 40 MG tablet TAKE 1 TABLET BY MOUTH  DAILY 90 tablet 1  . Calcium Carbonate-Vit D-Min (CALCIUM 1200) 1200-1000 MG-UNIT CHEW Chew 1 tablet by mouth daily.     . Cholecalciferol (VITAMIN D3) 3000 units TABS Take 1 tablet by mouth daily. 30 tablet   . hydrochlorothiazide (HYDRODIURIL) 25 MG tablet TAKE 1 TABLET BY MOUTH  DAILY 90 tablet 1  . Multiple Vitamins-Minerals (PRESERVISION AREDS 2) CAPS Take 1 capsule by mouth daily.    Marland Kitchen omeprazole (PRILOSEC) 40 MG capsule Take 1 capsule (40 mg total) by mouth daily. 90 capsule 1  . zoledronic acid (RECLAST) 5 MG/100ML SOLN Inject 5 mg into the vein. Annual infusion     No current facility-administered medications on file prior to visit.     Objective:  Objective  Physical Exam Vitals and nursing note reviewed.  Skin:    Findings: Erythema and rash present. Rash is scaling and vesicular.         BP (!) 156/65 (BP Location: Right Arm, Patient Position: Sitting, Cuff Size: Normal)   Pulse 78   Temp 98.3 F (36.8 C) (Oral)   Resp 18   Ht 4\' 11"  (1.499 m)   Wt 149 lb 3.2 oz (67.7 kg)  SpO2 97%   BMI 30.13 kg/m  Wt Readings from Last 3 Encounters:  03/02/20 149 lb 3.2 oz (67.7 kg)  08/29/19 146 lb (66.2 kg)  07/11/19 144 lb (65.3 kg)     Lab Results  Component Value Date   WBC 5.5 07/11/2019   HGB 13.4 07/11/2019   HCT 40.9 07/11/2019   PLT 196 07/11/2019   GLUCOSE 114 (H) 07/11/2019   CHOL 154 02/25/2019   TRIG 104.0 02/25/2019   HDL 57.00 02/25/2019   LDLDIRECT 100.0 02/19/2015   LDLCALC 76 02/25/2019   ALT 9 07/11/2019   AST 12 (L) 07/11/2019   NA 142 07/11/2019   K 4.2 07/11/2019   CL 106 07/11/2019   CREATININE 0.93 07/11/2019   BUN 16 07/11/2019    CO2 27 07/11/2019    DG Bone Density  Result Date: 07/08/2019 EXAM: DUAL X-RAY ABSORPTIOMETRY (DXA) FOR BONE MINERAL DENSITY IMPRESSION: Judith Flynn CHASE Your patient Judith Flynn completed a BMD test on 07/08/2019 using the Oretta (analysis version: 16.SP2) manufactured by [employer]. The following summarizes the results of our evaluation.[facility] PATIENT: Name: Judith Flynn, Judith Flynn Patient ID: [id number] Birth Date: July 23, 1941 Height: 59.0 in. Gender: Female Measured: 07/08/2019 Weight: 143.8 lbs. Indications: Advanced Age, Caucasian, Family Hx of Osteoporosis, Tobacco User Fractures: Treatments: Calcium, Reclast, Vitamin D ASSESSMENT: The BMD measured at AP Spine L1-L4 is 0.884 g/cm2 with a T-score of -2.5. This patient is considered osteoporotic according to [facility]) criteria. Compared with the prior study on, 03/15/2015 the BMD of the total mean shows a statistically significant decrease. The scan quality is good. Site Region Measured Date Measured Age WHO YA BMD Classification T-score AP Spine L1-L4 07/08/2019 79.9 Osteoporosis -2.5 0.884 g/cm2 AP Spine L1-L4 03/15/2015 75.6 Osteoporosis -2.5 0.876 g/cm2 DualFemur Total Mean 07/08/2019 79.9 Normal -0.8 0.909 g/cm2 DualFemur Total Mean 03/15/2015 75.6 Normal -0.5 0.942 g/cm2 World Health Organization [facility]) criteria for post-menopausal, Caucasian Women: Normal       T-score at or above -1 SD Osteopenia   T-score between -1 and -2.5 SD Osteoporosis T-score at or below -2.5 SD RECOMMENDATION:1. All patients should optimize calcium and vitamin D intake. 2. Consider FDA-approved medical therapies in postmenopausal women and men aged 28 years and older, based on the following: a. A hip or vertebral(clinical or morphometric) fracture. b. T-Score < -2.5 at the femoral neck or spine after appropriate evaluation to exclude secondary causes c. Low bone mass (T-score between -1.0 and -2.5 at the femoral neck or spine) and a 10 year  probability of a hip fracture >3% or a 10 year probability of major osteoporosis-related fracture > 20% based on the [place]-adapted WHO algorithm d. Clinical judgement and/or patient preferences may indicate treatment for people with 10-year fracture probabilities above or below these levels FOLLOW-UP: Patients with diagnosis of osteoporosis or at high risk for fracture should have regular bone mineral density tests. For patients eligible for Medicare, routine testing is allowed once every 2 years. The testing frequency can be increased to one year for patients who have rapidly progressing disease, those who are receiving or discontinuing medical therapy to restore bone mass, or have additional risk factors. I have reviewed this report, anf agree with the above findings. [facility] Radiology Electronically Signed   By: Lowella Grip III M.D.   On: 07/08/2019 14:17   MM 3D SCREEN BREAST BILATERAL  Result Date: 07/09/2019 CLINICAL DATA:  Screening. EXAM: DIGITAL SCREENING BILATERAL MAMMOGRAM WITH TOMO AND CAD COMPARISON:  Previous exam(s). ACR Breast Density Category b: There are scattered areas of fibroglandular density. FINDINGS: There are no findings suspicious for malignancy. Images were processed with CAD. IMPRESSION: No mammographic evidence of malignancy. A result letter of this screening mammogram will be mailed directly to the patient. RECOMMENDATION: Screening mammogram in one year. (Code:[id number]-01Y) BI-RADS CATEGORY  1: Negative. Electronically Signed   By: Abelardo Diesel M.D.   On: 07/09/2019 10:34     Assessment & Plan:  Plan  I have discontinued Makara Lanzo. Shamoon's buPROPion. I am also having her start on doxycycline and clotrimazole-betamethasone. Additionally, I am having her maintain her Calcium 1200, zoledronic acid, acetaminophen, PreserVision AREDS 2, aspirin, Vitamin D3, hydrochlorothiazide, atorvastatin, and omeprazole.  Meds ordered this encounter  Medications  . doxycycline  (VIBRA-TABS) 100 MG tablet    Sig: Take 1 tablet (100 mg total) by mouth 2 (two) times daily.    Dispense:  20 tablet    Refill:  0  . clotrimazole-betamethasone (LOTRISONE) cream    Sig: Apply 1 application topically 2 (two) times daily.    Dispense:  30 g    Refill:  0    Problem List Items Addressed This Visit      Unprioritized   Athlete's foot on right - Primary    lotrisone cream Skin very red and irritated  abx sent in as well  Td updated      Relevant Medications   doxycycline (VIBRA-TABS) 100 MG tablet   clotrimazole-betamethasone (LOTRISONE) cream   Blister of right foot    It did burst Area red / inflammed abx sent in and td updated       Relevant Orders   Td : Tetanus/diphtheria >7yo Preservative  free (Completed)      Follow-up: Return if symptoms worsen or fail to improve.  Ann Held, DO

## 2020-03-02 NOTE — Assessment & Plan Note (Signed)
It did burst Area red / inflammed abx sent in and td updated

## 2020-03-02 NOTE — Patient Instructions (Signed)

## 2020-04-16 ENCOUNTER — Other Ambulatory Visit: Payer: Self-pay | Admitting: Family

## 2020-04-19 ENCOUNTER — Other Ambulatory Visit: Payer: Self-pay | Admitting: Family

## 2020-04-19 DIAGNOSIS — H43823 Vitreomacular adhesion, bilateral: Secondary | ICD-10-CM | POA: Diagnosis not present

## 2020-04-19 DIAGNOSIS — H43813 Vitreous degeneration, bilateral: Secondary | ICD-10-CM | POA: Diagnosis not present

## 2020-04-19 DIAGNOSIS — H353132 Nonexudative age-related macular degeneration, bilateral, intermediate dry stage: Secondary | ICD-10-CM | POA: Diagnosis not present

## 2020-04-19 DIAGNOSIS — H35033 Hypertensive retinopathy, bilateral: Secondary | ICD-10-CM | POA: Diagnosis not present

## 2020-04-22 DIAGNOSIS — Z961 Presence of intraocular lens: Secondary | ICD-10-CM | POA: Diagnosis not present

## 2020-04-22 DIAGNOSIS — H01001 Unspecified blepharitis right upper eyelid: Secondary | ICD-10-CM | POA: Diagnosis not present

## 2020-04-22 DIAGNOSIS — H40011 Open angle with borderline findings, low risk, right eye: Secondary | ICD-10-CM | POA: Diagnosis not present

## 2020-04-22 DIAGNOSIS — H01004 Unspecified blepharitis left upper eyelid: Secondary | ICD-10-CM | POA: Diagnosis not present

## 2020-04-22 DIAGNOSIS — H353132 Nonexudative age-related macular degeneration, bilateral, intermediate dry stage: Secondary | ICD-10-CM | POA: Diagnosis not present

## 2020-05-06 DIAGNOSIS — H353132 Nonexudative age-related macular degeneration, bilateral, intermediate dry stage: Secondary | ICD-10-CM | POA: Diagnosis not present

## 2020-05-19 ENCOUNTER — Ambulatory Visit (INDEPENDENT_AMBULATORY_CARE_PROVIDER_SITE_OTHER): Payer: Medicare Other

## 2020-05-19 ENCOUNTER — Other Ambulatory Visit: Payer: Self-pay

## 2020-05-19 DIAGNOSIS — Z23 Encounter for immunization: Secondary | ICD-10-CM | POA: Diagnosis not present

## 2020-05-19 NOTE — Progress Notes (Signed)
Pt was given flu shot in left deltoid Pt tolerated well

## 2020-05-22 ENCOUNTER — Ambulatory Visit: Payer: Medicare Other

## 2020-05-25 ENCOUNTER — Other Ambulatory Visit: Payer: Self-pay | Admitting: Family

## 2020-05-27 DIAGNOSIS — H40022 Open angle with borderline findings, high risk, left eye: Secondary | ICD-10-CM | POA: Diagnosis not present

## 2020-05-27 DIAGNOSIS — H401111 Primary open-angle glaucoma, right eye, mild stage: Secondary | ICD-10-CM | POA: Diagnosis not present

## 2020-06-03 ENCOUNTER — Ambulatory Visit: Payer: Self-pay | Admitting: *Deleted

## 2020-06-05 DIAGNOSIS — H353132 Nonexudative age-related macular degeneration, bilateral, intermediate dry stage: Secondary | ICD-10-CM | POA: Diagnosis not present

## 2020-06-23 ENCOUNTER — Telehealth (INDEPENDENT_AMBULATORY_CARE_PROVIDER_SITE_OTHER): Payer: Medicare Other | Admitting: Family

## 2020-06-23 ENCOUNTER — Other Ambulatory Visit: Payer: Self-pay

## 2020-06-23 ENCOUNTER — Telehealth: Payer: Self-pay | Admitting: Family

## 2020-06-23 DIAGNOSIS — L989 Disorder of the skin and subcutaneous tissue, unspecified: Secondary | ICD-10-CM | POA: Diagnosis not present

## 2020-06-23 MED ORDER — CEPHALEXIN 500 MG PO CAPS: 500.0000 mg | ORAL_CAPSULE | Freq: Three times a day (TID) | ORAL | 0 refills | Status: DC

## 2020-06-23 NOTE — Telephone Encounter (Signed)
LVM for patient to call back. ?

## 2020-06-23 NOTE — Telephone Encounter (Signed)
Please disregard- this was in error.

## 2020-06-23 NOTE — Telephone Encounter (Signed)
Could you please ask pt if she ever completed her CT scan of her chest when she was out of town?  I never received a copy- I would like to request a copy if she did complete. Otherwise,  I would recommend that we reschedule.  Let me know please.

## 2020-06-23 NOTE — Telephone Encounter (Signed)
Lvm for patient to be aware no call back needed.

## 2020-06-23 NOTE — Progress Notes (Signed)
Virtual Visit via Video Note  I connected with Judith Flynn on 06/23/20 at  9:20 AM EST by a video enabled telemedicine application and verified that I am speaking with the correct person using two identifiers.  Location: Patient: home Provider: home   I discussed the limitations of evaluation and management by telemedicine and the availability of in person appointments. The patient expressed understanding and agreed to proceed. Only the patient and myself were present for today's video call.   History of Present Illness:  Patient is an 79 yr old female who presents today with chief complaint of lesion on her chin. She reports that it scabbed initially and she thought it was a pimple. She squeezed in and "it bled." She reports that it continues to bleed intermittently.  Has some surrounding redness and notes "it's white in the middle."  Denies associated tenderness.      Observations/Objective:   Gen: Awake, alert, no acute distress Resp: Breathing is even and non-labored Psych: calm/pleasant demeanor Neuro: Alert and Oriented x 3, + facial symmetry, speech is clear. Skin:  Small scabbed lesion on chin with small amount of surrounding erythema and swelling.   Assessment and Plan:  Skin lesion- will rx with keflex empirically. We discussed that if lesion worsens or if it fails to improve she will let me know. We would need to consider dermatology referral for biopsy at that time. Pt verbalizes understanding.   Follow Up Instructions:    I discussed the assessment and treatment plan with the patient. The patient was provided an opportunity to ask questions and all were answered. The patient agreed with the plan and demonstrated an understanding of the instructions.   The patient was advised to call back or seek an in-person evaluation if the symptoms worsen or if the condition fails to improve as anticipated.  Nance Pear, NP

## 2020-07-05 DIAGNOSIS — H353132 Nonexudative age-related macular degeneration, bilateral, intermediate dry stage: Secondary | ICD-10-CM | POA: Diagnosis not present

## 2020-07-06 ENCOUNTER — Other Ambulatory Visit: Payer: Self-pay | Admitting: Family

## 2020-07-07 DIAGNOSIS — D489 Neoplasm of uncertain behavior, unspecified: Secondary | ICD-10-CM | POA: Diagnosis not present

## 2020-07-09 ENCOUNTER — Inpatient Hospital Stay: Payer: Medicare Other | Attending: Family | Admitting: Family

## 2020-07-09 ENCOUNTER — Inpatient Hospital Stay: Payer: Medicare Other

## 2020-07-09 ENCOUNTER — Other Ambulatory Visit: Payer: Self-pay

## 2020-07-09 ENCOUNTER — Telehealth: Payer: Self-pay

## 2020-07-09 ENCOUNTER — Encounter: Payer: Self-pay | Admitting: Family

## 2020-07-09 VITALS — BP 138/77 | HR 78 | Temp 98.2°F | Resp 18 | Ht 59.0 in | Wt 142.1 lb

## 2020-07-09 DIAGNOSIS — M810 Age-related osteoporosis without current pathological fracture: Secondary | ICD-10-CM | POA: Insufficient documentation

## 2020-07-09 DIAGNOSIS — Z86000 Personal history of in-situ neoplasm of breast: Secondary | ICD-10-CM | POA: Diagnosis not present

## 2020-07-09 DIAGNOSIS — Z79899 Other long term (current) drug therapy: Secondary | ICD-10-CM | POA: Insufficient documentation

## 2020-07-09 DIAGNOSIS — Z853 Personal history of malignant neoplasm of breast: Secondary | ICD-10-CM | POA: Diagnosis not present

## 2020-07-09 DIAGNOSIS — M8000XA Age-related osteoporosis with current pathological fracture, unspecified site, initial encounter for fracture: Secondary | ICD-10-CM

## 2020-07-09 LAB — CBC WITH DIFFERENTIAL (CANCER CENTER ONLY)
Abs Immature Granulocytes: 0.02 10*3/uL (ref 0.00–0.07)
Basophils Absolute: 0.1 10*3/uL (ref 0.0–0.1)
Basophils Relative: 1 %
Eosinophils Absolute: 0.1 10*3/uL (ref 0.0–0.5)
Eosinophils Relative: 1 %
HCT: 41.8 % (ref 36.0–46.0)
Hemoglobin: 13.8 g/dL (ref 12.0–15.0)
Immature Granulocytes: 0 %
Lymphocytes Relative: 19 %
Lymphs Abs: 1.2 10*3/uL (ref 0.7–4.0)
MCH: 30.9 pg (ref 26.0–34.0)
MCHC: 33 g/dL (ref 30.0–36.0)
MCV: 93.5 fL (ref 80.0–100.0)
Monocytes Absolute: 0.4 10*3/uL (ref 0.1–1.0)
Monocytes Relative: 7 %
Neutro Abs: 4.5 10*3/uL (ref 1.7–7.7)
Neutrophils Relative %: 72 %
Platelet Count: 208 10*3/uL (ref 150–400)
RBC: 4.47 MIL/uL (ref 3.87–5.11)
RDW: 12.5 % (ref 11.5–15.5)
WBC Count: 6.2 10*3/uL (ref 4.0–10.5)
nRBC: 0 % (ref 0.0–0.2)

## 2020-07-09 LAB — CMP (CANCER CENTER ONLY)
ALT: 8 U/L (ref 0–44)
AST: 11 U/L — ABNORMAL LOW (ref 15–41)
Albumin: 4.7 g/dL (ref 3.5–5.0)
Alkaline Phosphatase: 59 U/L (ref 38–126)
Anion gap: 9 (ref 5–15)
BUN: 12 mg/dL (ref 8–23)
CO2: 29 mmol/L (ref 22–32)
Calcium: 9.9 mg/dL (ref 8.9–10.3)
Chloride: 102 mmol/L (ref 98–111)
Creatinine: 1.04 mg/dL — ABNORMAL HIGH (ref 0.44–1.00)
GFR, Estimated: 54 mL/min — ABNORMAL LOW (ref >60)
Glucose, Bld: 115 mg/dL — ABNORMAL HIGH (ref 70–99)
Potassium: 4.5 mmol/L (ref 3.5–5.1)
Sodium: 140 mmol/L (ref 135–145)
Total Bilirubin: 0.6 mg/dL (ref 0.3–1.2)
Total Protein: 7.2 g/dL (ref 6.5–8.1)

## 2020-07-09 MED ORDER — DENOSUMAB 60 MG/ML ~~LOC~~ SOSY
60.0000 mg | PREFILLED_SYRINGE | SUBCUTANEOUS | Status: DC
  Administered 2020-07-09: 12:00:00 60 mg via SUBCUTANEOUS

## 2020-07-09 MED ORDER — DENOSUMAB 60 MG/ML ~~LOC~~ SOSY
PREFILLED_SYRINGE | SUBCUTANEOUS | Status: AC
  Filled 2020-07-09: qty 1

## 2020-07-09 NOTE — Patient Instructions (Signed)
Denosumab injection (Prolia) What is this medicine? DENOSUMAB (den oh sue mab) slows bone breakdown. Prolia is used to treat osteoporosis in women after menopause and in men, and in people who are taking corticosteroids for 6 months or more. Xgeva is used to treat a high calcium level due to cancer and to prevent bone fractures and other bone problems caused by multiple myeloma or cancer bone metastases. Xgeva is also used to treat giant cell tumor of the bone. This medicine may be used for other purposes; ask your health care provider or pharmacist if you have questions. COMMON BRAND NAME(S): Prolia, XGEVA What should I tell my health care provider before I take this medicine? They need to know if you have any of these conditions:  dental disease  having surgery or tooth extraction  infection  kidney disease  low levels of calcium or Vitamin D in the blood  malnutrition  on hemodialysis  skin conditions or sensitivity  thyroid or parathyroid disease  an unusual reaction to denosumab, other medicines, foods, dyes, or preservatives  pregnant or trying to get pregnant  breast-feeding How should I use this medicine? This medicine is for injection under the skin. It is given by a health care professional in a hospital or clinic setting. A special MedGuide will be given to you before each treatment. Be sure to read this information carefully each time. For Prolia, talk to your pediatrician regarding the use of this medicine in children. Special care may be needed. For Xgeva, talk to your pediatrician regarding the use of this medicine in children. While this drug may be prescribed for children as young as 13 years for selected conditions, precautions do apply. Overdosage: If you think you have taken too much of this medicine contact a poison control center or emergency room at once. NOTE: This medicine is only for you. Do not share this medicine with others. What if I miss a dose? It  is important not to miss your dose. Call your doctor or health care professional if you are unable to keep an appointment. What may interact with this medicine? Do not take this medicine with any of the following medications:  other medicines containing denosumab This medicine may also interact with the following medications:  medicines that lower your chance of fighting infection  steroid medicines like prednisone or cortisone This list may not describe all possible interactions. Give your health care provider a list of all the medicines, herbs, non-prescription drugs, or dietary supplements you use. Also tell them if you smoke, drink alcohol, or use illegal drugs. Some items may interact with your medicine. What should I watch for while using this medicine? Visit your doctor or health care professional for regular checks on your progress. Your doctor or health care professional may order blood tests and other tests to see how you are doing. Call your doctor or health care professional for advice if you get a fever, chills or sore throat, or other symptoms of a cold or flu. Do not treat yourself. This drug may decrease your body's ability to fight infection. Try to avoid being around people who are sick. You should make sure you get enough calcium and vitamin D while you are taking this medicine, unless your doctor tells you not to. Discuss the foods you eat and the vitamins you take with your health care professional. See your dentist regularly. Brush and floss your teeth as directed. Before you have any dental work done, tell your dentist you   are receiving this medicine. Do not become pregnant while taking this medicine or for 5 months after stopping it. Talk with your doctor or health care professional about your birth control options while taking this medicine. Women should inform their doctor if they wish to become pregnant or think they might be pregnant. There is a potential for serious side  effects to an unborn child. Talk to your health care professional or pharmacist for more information. What side effects may I notice from receiving this medicine? Side effects that you should report to your doctor or health care professional as soon as possible:  allergic reactions like skin rash, itching or hives, swelling of the face, lips, or tongue  bone pain  breathing problems  dizziness  jaw pain, especially after dental work  redness, blistering, peeling of the skin  signs and symptoms of infection like fever or chills; cough; sore throat; pain or trouble passing urine  signs of low calcium like fast heartbeat, muscle cramps or muscle pain; pain, tingling, numbness in the hands or feet; seizures  unusual bleeding or bruising  unusually weak or tired Side effects that usually do not require medical attention (report to your doctor or health care professional if they continue or are bothersome):  constipation  diarrhea  headache  joint pain  loss of appetite  muscle pain  runny nose  tiredness  upset stomach This list may not describe all possible side effects. Call your doctor for medical advice about side effects. You may report side effects to FDA at 1-800-FDA-1088. Where should I keep my medicine? This medicine is only given in a clinic, doctor's office, or other health care setting and will not be stored at home. NOTE: This sheet is a summary. It may not cover all possible information. If you have questions about this medicine, talk to your doctor, pharmacist, or health care provider.  2020 Elsevier/Gold Standard (2017-11-16 16:10:44)

## 2020-07-09 NOTE — Progress Notes (Signed)
Hematology and Oncology Follow Up Visit  Judith Flynn [id number] Feb 16, 1941 79 y.o. 07/09/2020   Principle Diagnosis:  Ductal carcinoma in situ of the left breast Osteoporosis  Past Therapy: Zometa 5 mg IV q yr -  D/c on 07/11/2019  Current Therapy:        Prolia 60 mg IM once a year-- started on 06/2020   Interim History:  Judith Flynn is here today for follow-up. She is doing quite well and has no complaints at this time.  Bilateral breast exam today was negative. Left lumpectomy site at the 1 o'clock position intact. No mass, lesion or rash noted. She does regular self breast exams at home as well.  No adenopathy.  No fever, chills, n/v, cough, rash, dizziness, SOB, chest pain, palpitations, abdominal pain or changes in bowel or bladder habits.  No blood loss noted. No bruising or petechiae.  No swelling, tenderness, numbness or tingling in her extremities.  No falls or syncope.  She has maintained a good appetite and is staying well hydrated. Her weight is stable at 142 lbs.   ECOG Performance Status: 1 - Symptomatic but completely ambulatory  Medications:  Allergies as of 07/09/2020      Reactions   Bupropion    tremors   Lisinopril    REACTION: cough      Medication List       Accurate as of July 09, 2020 11:00 AM. If you have any questions, ask your nurse or doctor.        acetaminophen 325 MG tablet Commonly known as: TYLENOL Take 650 mg by mouth every 6 (six) hours as needed.   aspirin 81 MG tablet Take 81 mg by mouth daily.   atorvastatin 40 MG tablet Commonly known as: LIPITOR TAKE 1 TABLET BY MOUTH  DAILY   buPROPion 150 MG 24 hr tablet Commonly known as: WELLBUTRIN XL TAKE ONE (1) TABLET BY MOUTH EACH DAY   Calcium 1200 1200-1000 MG-UNIT Chew Chew 1 tablet by mouth daily.   cephALEXin 500 MG capsule Commonly known as: KEFLEX Take 1 capsule (500 mg total) by mouth 3 (three) times daily.   clotrimazole-betamethasone cream Commonly  known as: Lotrisone Apply 1 application topically 2 (two) times daily.   doxycycline 100 MG tablet Commonly known as: VIBRA-TABS Take 1 tablet (100 mg total) by mouth 2 (two) times daily.   hydrochlorothiazide 25 MG tablet Commonly known as: HYDRODIURIL TAKE 1 TABLET BY MOUTH  DAILY   omeprazole 40 MG capsule Commonly known as: PRILOSEC TAKE ONE (1) CAPSULE EACH DAY BY MOUTH   PreserVision AREDS 2 Caps Take 1 capsule by mouth daily.   Reclast 5 MG/100ML Soln injection Generic drug: zoledronic acid Inject 5 mg into the vein. Annual infusion   Vitamin D3 75 MCG (3000 UT) Tabs Take 1 tablet by mouth daily.       Allergies:  Allergies  Allergen Reactions   Bupropion     tremors   Lisinopril     REACTION: cough    Past Medical History, Surgical history, Social history, and Family History were reviewed and updated.  Review of Systems: All other 10 point review of systems is negative.   Physical Exam:  vitals were not taken for this visit.   Wt Readings from Last 3 Encounters:  03/02/20 149 lb 3.2 oz (67.7 kg)  08/29/19 146 lb (66.2 kg)  07/11/19 144 lb (65.3 kg)    Ocular: Sclerae unicteric, pupils equal, round and reactive to light Ear-nose-throat:  Oropharynx clear, dentition fair Lymphatic: No cervical, supraclavicular or axillary adenopathy Lungs no rales or rhonchi, good excursion bilaterally Heart regular rate and rhythm, no murmur appreciated Abd soft, nontender, positive bowel sounds MSK no focal spinal tenderness, no joint edema Neuro: non-focal, well-oriented, appropriate affect Breasts: No changes. No mass, lesion or rash noted.   Lab Results  Component Value Date   WBC 5.5 07/11/2019   HGB 13.4 07/11/2019   HCT 40.9 07/11/2019   MCV 93.8 07/11/2019   PLT 196 07/11/2019   No results found for: FERRITIN, IRON, TIBC, UIBC, IRONPCTSAT Lab Results  Component Value Date   RBC 4.36 07/11/2019   No results found for: KPAFRELGTCHN, LAMBDASER,  KAPLAMBRATIO No results found for: IGGSERUM, IGA, IGMSERUM No results found for: Odetta Pink, SPEI   Chemistry      Component Value Date/Time   NA 142 07/11/2019 1124   NA 146 (H) 06/27/2017 0903   K 4.2 07/11/2019 1124   K 3.9 06/27/2017 0903   CL 106 07/11/2019 1124   CL 106 06/27/2017 0903   CO2 27 07/11/2019 1124   CO2 27 06/27/2017 0903   BUN 16 07/11/2019 1124   BUN 11 06/27/2017 0903   CREATININE 0.93 07/11/2019 1124   CREATININE 0.8 06/27/2017 0903      Component Value Date/Time   CALCIUM 9.4 07/11/2019 1124   CALCIUM 9.2 06/27/2017 0903   ALKPHOS 60 07/11/2019 1124   ALKPHOS 68 06/27/2017 0903   AST 12 (L) 07/11/2019 1124   ALT 9 07/11/2019 1124   ALT 16 06/27/2017 0903   BILITOT 0.5 07/11/2019 1124       Impression and Plan: Judith Flynn is a very pleasant 78 yo caucasian female with history of ductal carcinoma in situ of the left breast almost 20 years ago. She continues to do well and so far there has been no evidence of recurrence.  She received Prolia today as planned.  Follow-up in 1 year.  She was encouraged to contact our office with any questions or concerns.   Laverna Peace, NP 12/17/202111:00 AM

## 2020-07-09 NOTE — Telephone Encounter (Signed)
S/w pt and she is aware of her one year appt per 07/09/20 los....Marland KitchenAOM

## 2020-07-12 ENCOUNTER — Other Ambulatory Visit: Payer: Self-pay | Admitting: Hematology & Oncology

## 2020-08-02 DIAGNOSIS — H401111 Primary open-angle glaucoma, right eye, mild stage: Secondary | ICD-10-CM | POA: Diagnosis not present

## 2020-08-02 DIAGNOSIS — H40022 Open angle with borderline findings, high risk, left eye: Secondary | ICD-10-CM | POA: Diagnosis not present

## 2020-08-04 DIAGNOSIS — H353132 Nonexudative age-related macular degeneration, bilateral, intermediate dry stage: Secondary | ICD-10-CM | POA: Diagnosis not present

## 2020-08-24 ENCOUNTER — Other Ambulatory Visit (HOSPITAL_BASED_OUTPATIENT_CLINIC_OR_DEPARTMENT_OTHER): Payer: Self-pay | Admitting: Family

## 2020-08-24 DIAGNOSIS — Z1231 Encounter for screening mammogram for malignant neoplasm of breast: Secondary | ICD-10-CM

## 2020-09-02 DIAGNOSIS — D1801 Hemangioma of skin and subcutaneous tissue: Secondary | ICD-10-CM | POA: Diagnosis not present

## 2020-09-02 DIAGNOSIS — D489 Neoplasm of uncertain behavior, unspecified: Secondary | ICD-10-CM | POA: Diagnosis not present

## 2020-09-03 DIAGNOSIS — H353132 Nonexudative age-related macular degeneration, bilateral, intermediate dry stage: Secondary | ICD-10-CM | POA: Diagnosis not present

## 2020-09-09 DIAGNOSIS — H401111 Primary open-angle glaucoma, right eye, mild stage: Secondary | ICD-10-CM | POA: Diagnosis not present

## 2020-09-09 DIAGNOSIS — H40022 Open angle with borderline findings, high risk, left eye: Secondary | ICD-10-CM | POA: Diagnosis not present

## 2020-09-20 ENCOUNTER — Other Ambulatory Visit: Payer: Self-pay

## 2020-09-20 ENCOUNTER — Ambulatory Visit (HOSPITAL_BASED_OUTPATIENT_CLINIC_OR_DEPARTMENT_OTHER)
Admission: RE | Admit: 2020-09-20 | Discharge: 2020-09-20 | Disposition: A | Discharge status: Home / Self Care | Payer: Medicare Other | Source: Ambulatory Visit | Attending: Family | Admitting: Family

## 2020-09-20 ENCOUNTER — Encounter (HOSPITAL_BASED_OUTPATIENT_CLINIC_OR_DEPARTMENT_OTHER): Payer: Self-pay

## 2020-09-20 DIAGNOSIS — Z1231 Encounter for screening mammogram for malignant neoplasm of breast: Secondary | ICD-10-CM | POA: Insufficient documentation

## 2020-09-20 IMAGING — MG MM DIGITAL SCREENING BILAT W/ TOMO AND CAD
8 series · 8 of 24 positions shown · non-contrast
Comparison: Previous exam(s).

ACR Breast Density Category a: The breast tissue is almost entirely
fatty.

CLINICAL DATA: Screening.

EXAM:
DIGITAL SCREENING BILATERAL MAMMOGRAM WITH TOMOSYNTHESIS AND CAD
TECHNIQUE: Bilateral screening digital craniocaudal and mediolateral oblique
mammograms were obtained. Bilateral screening digital breast
tomosynthesis was performed. The images were evaluated with
computer-aided detection.

[R CC synth-2D]
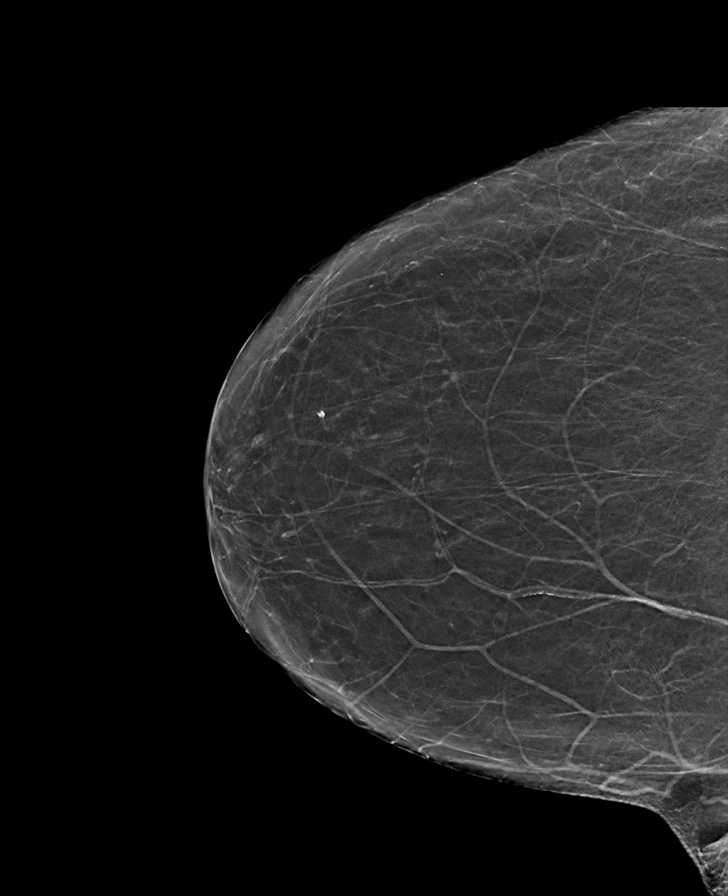

[L MLO synth-2D]
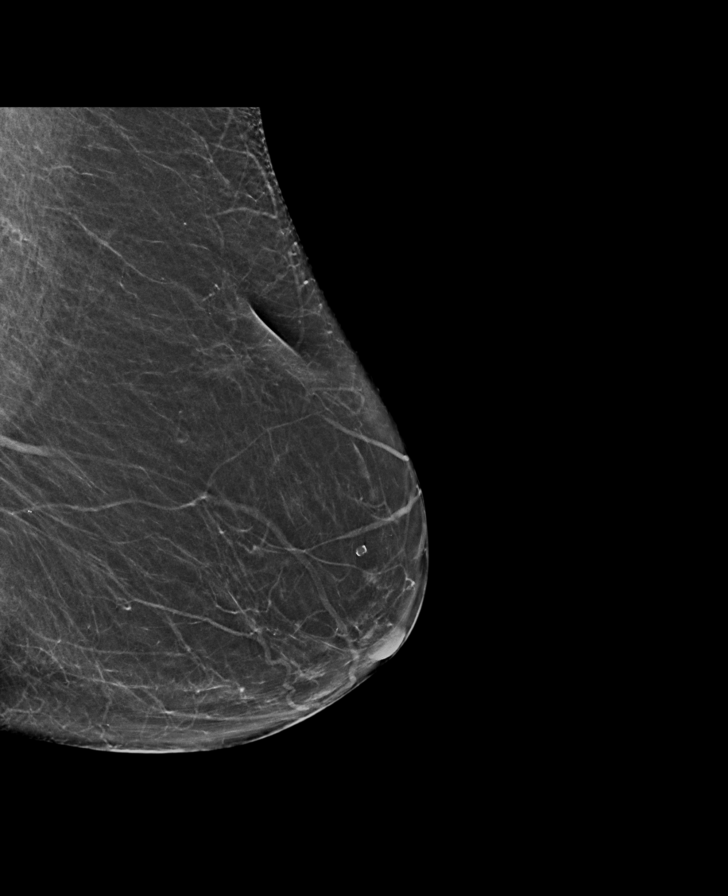

[L CC synth-2D]
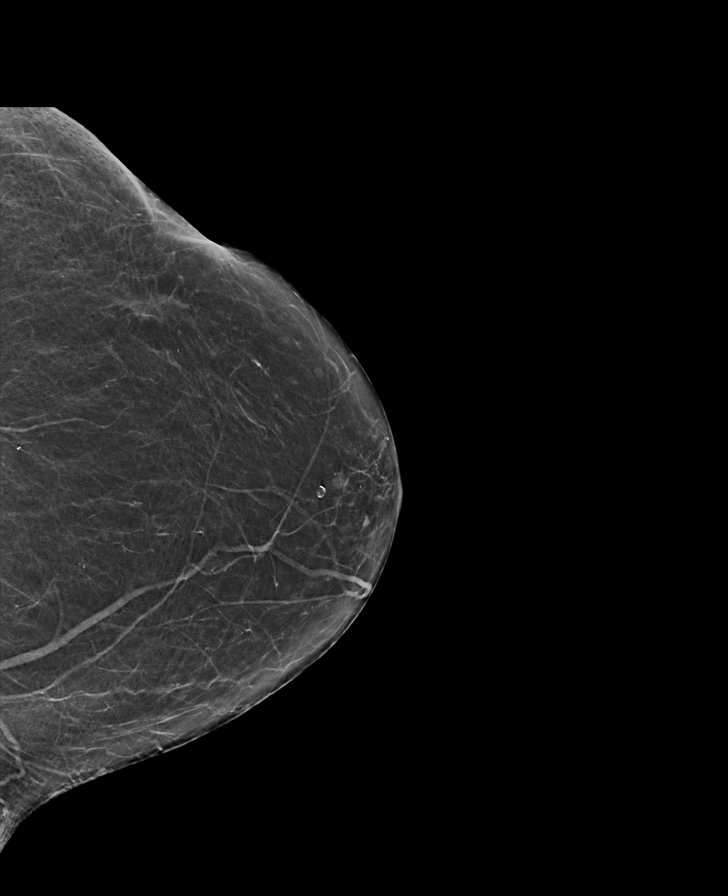

[R MLO synth-2D]
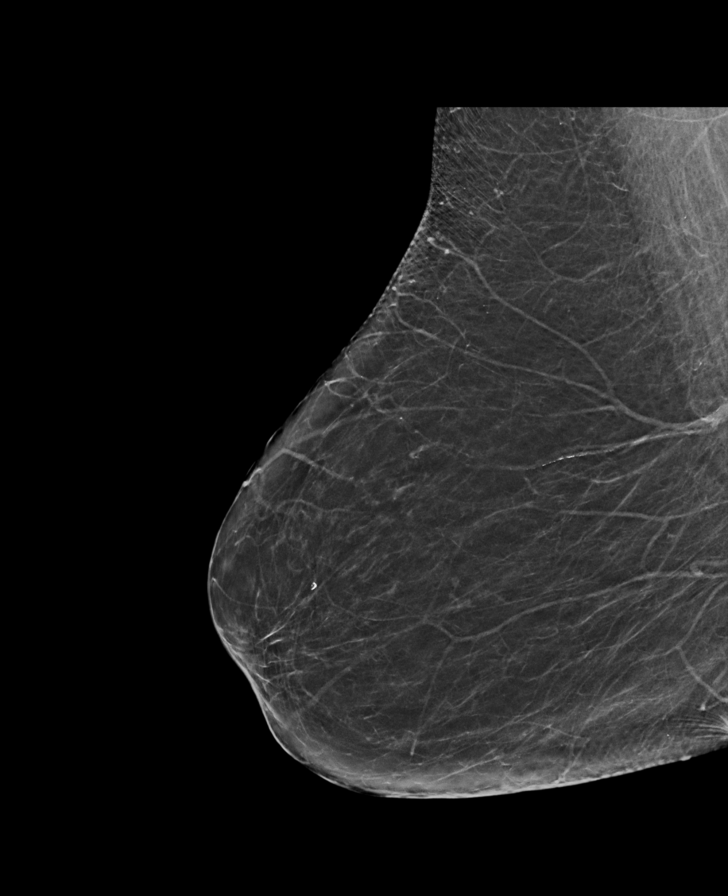

[L MLO tomo · tomo slice 27/53.0]
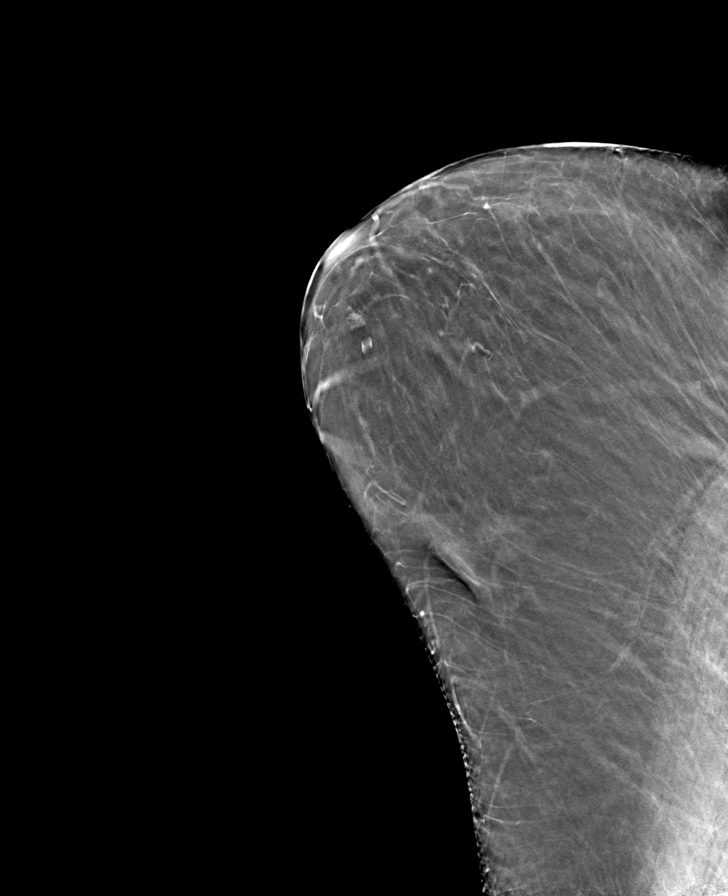

[R CC tomo · tomo slice 27/52.0]
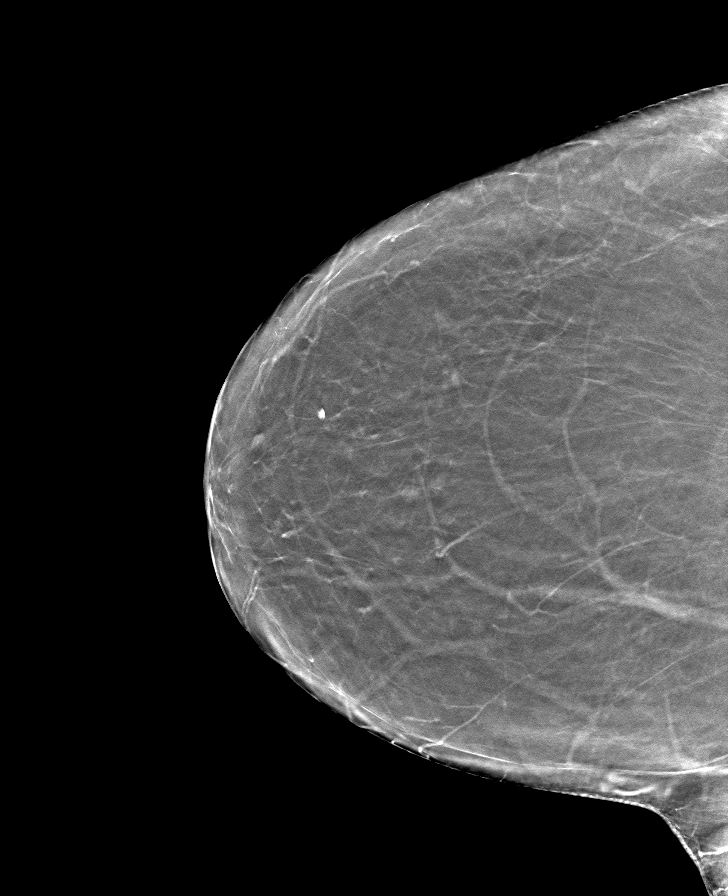

[L CC tomo · tomo slice 27/54.0]
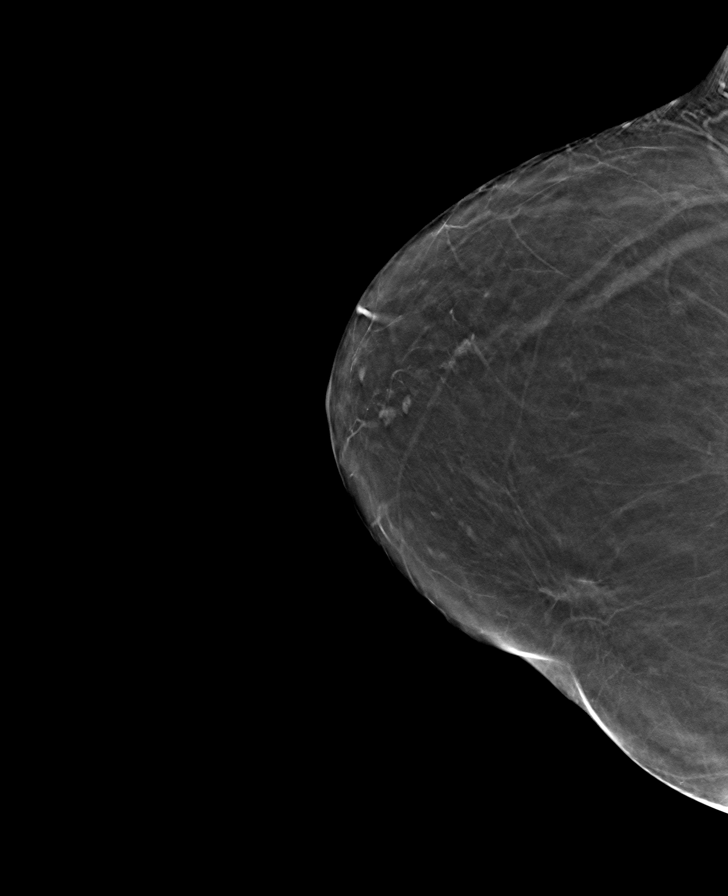

[R MLO tomo · tomo slice 31/62.0]
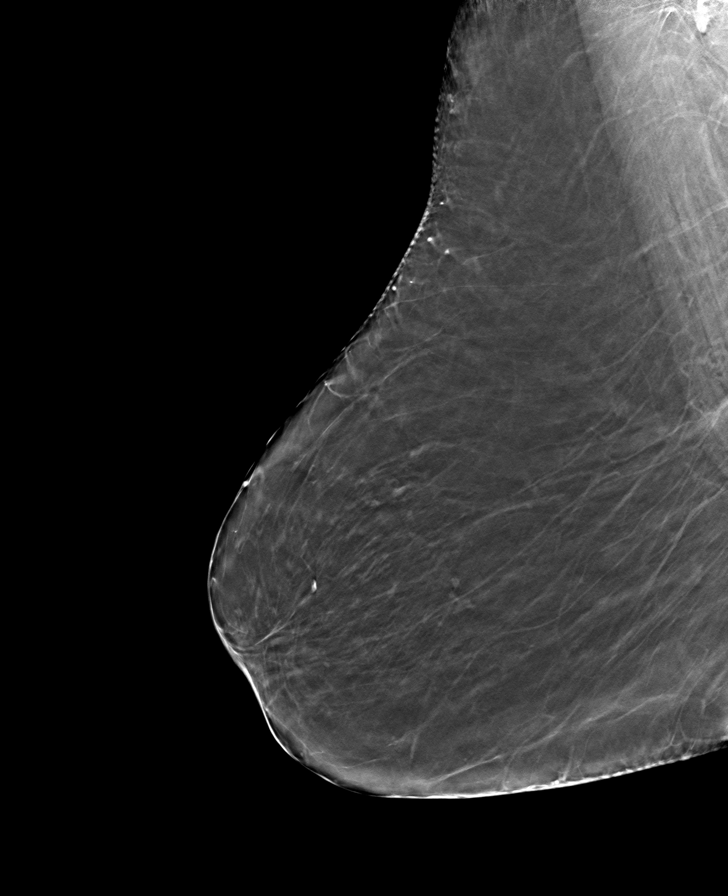

[8 of 24 positions shown; findings below may reference images not displayed]

FINDINGS: There are no findings suspicious for malignancy. The images were
evaluated with computer-aided detection.
IMPRESSION: No mammographic evidence of malignancy. A result letter of this
screening mammogram will be mailed directly to the patient.

RECOMMENDATION:
Screening mammogram in one year. (Code:[id number])

BI-RADS CATEGORY  1: Negative.

## 2020-10-03 DIAGNOSIS — H353132 Nonexudative age-related macular degeneration, bilateral, intermediate dry stage: Secondary | ICD-10-CM | POA: Diagnosis not present

## 2020-10-04 DIAGNOSIS — H353132 Nonexudative age-related macular degeneration, bilateral, intermediate dry stage: Secondary | ICD-10-CM | POA: Diagnosis not present

## 2020-10-04 DIAGNOSIS — H3540 Unspecified peripheral retinal degeneration: Secondary | ICD-10-CM | POA: Diagnosis not present

## 2020-10-04 DIAGNOSIS — H43813 Vitreous degeneration, bilateral: Secondary | ICD-10-CM | POA: Diagnosis not present

## 2020-10-04 DIAGNOSIS — H43823 Vitreomacular adhesion, bilateral: Secondary | ICD-10-CM | POA: Diagnosis not present

## 2020-10-06 ENCOUNTER — Other Ambulatory Visit: Payer: Self-pay | Admitting: Family

## 2020-10-06 MED ORDER — HYDROCHLOROTHIAZIDE 25 MG PO TABS: 25.0000 mg | ORAL_TABLET | Freq: Every day | ORAL | 1 refills | Status: DC

## 2020-11-02 DIAGNOSIS — H353122 Nonexudative age-related macular degeneration, left eye, intermediate dry stage: Secondary | ICD-10-CM | POA: Diagnosis not present

## 2020-11-05 ENCOUNTER — Other Ambulatory Visit: Payer: Self-pay | Admitting: Family

## 2020-11-26 ENCOUNTER — Ambulatory Visit: Payer: Medicare Other | Attending: Internal Medicine

## 2020-11-26 DIAGNOSIS — Z23 Encounter for immunization: Secondary | ICD-10-CM

## 2020-11-26 NOTE — Progress Notes (Signed)
   Covid-19 Vaccination Clinic  Name:  Judith Flynn    MRN: [id number] DOB: 15-Mar-1941  11/26/2020  Ms. Broccoli was observed post Covid-19 immunization for 15 minutes without incident. She was provided with Vaccine Information Sheet and instruction to access the V-Safe system.   [name] was instructed to call 911 with any severe reactions post vaccine: Marland Kitchen Difficulty breathing  . Swelling of face and throat  . A fast heartbeat  . A bad rash all over body  . Dizziness and weakness   Immunizations Administered    Name Date Dose VIS Date Route   PFIZER Comrnaty(Gray TOP) Covid-19 Vaccine 11/26/2020  3:00 PM 0.3 mL 07/01/2020 Intramuscular   Manufacturer: [place]   Lot: [id number]   NDC: [phone]

## 2020-12-02 DIAGNOSIS — H353122 Nonexudative age-related macular degeneration, left eye, intermediate dry stage: Secondary | ICD-10-CM | POA: Diagnosis not present

## 2020-12-03 ENCOUNTER — Other Ambulatory Visit (HOSPITAL_BASED_OUTPATIENT_CLINIC_OR_DEPARTMENT_OTHER): Payer: Self-pay

## 2020-12-03 MED ORDER — PFIZER-BIONT COVID-19 VAC-TRIS 30 MCG/0.3ML IM SUSP
INTRAMUSCULAR | 0 refills | Status: DC
  Filled 2020-12-03: qty 0.3, 1d supply, fill #0

## 2021-01-01 DIAGNOSIS — H353122 Nonexudative age-related macular degeneration, left eye, intermediate dry stage: Secondary | ICD-10-CM | POA: Diagnosis not present

## 2021-01-07 ENCOUNTER — Other Ambulatory Visit: Payer: Self-pay | Admitting: Family

## 2021-01-07 MED ORDER — SHINGRIX 50 MCG/0.5ML IM SUSR: 1.0000 mL | Freq: Once | INTRAMUSCULAR | 1 refills | Status: AC

## 2021-01-12 DIAGNOSIS — H353132 Nonexudative age-related macular degeneration, bilateral, intermediate dry stage: Secondary | ICD-10-CM | POA: Diagnosis not present

## 2021-01-12 DIAGNOSIS — H40022 Open angle with borderline findings, high risk, left eye: Secondary | ICD-10-CM | POA: Diagnosis not present

## 2021-01-12 DIAGNOSIS — H401111 Primary open-angle glaucoma, right eye, mild stage: Secondary | ICD-10-CM | POA: Diagnosis not present

## 2021-01-12 DIAGNOSIS — H01004 Unspecified blepharitis left upper eyelid: Secondary | ICD-10-CM | POA: Diagnosis not present

## 2021-01-31 DIAGNOSIS — H353122 Nonexudative age-related macular degeneration, left eye, intermediate dry stage: Secondary | ICD-10-CM | POA: Diagnosis not present

## 2021-02-07 ENCOUNTER — Other Ambulatory Visit: Payer: Self-pay | Admitting: Family

## 2021-03-02 DIAGNOSIS — H353122 Nonexudative age-related macular degeneration, left eye, intermediate dry stage: Secondary | ICD-10-CM | POA: Diagnosis not present

## 2021-04-01 DIAGNOSIS — H353122 Nonexudative age-related macular degeneration, left eye, intermediate dry stage: Secondary | ICD-10-CM | POA: Diagnosis not present

## 2021-04-04 DIAGNOSIS — H353132 Nonexudative age-related macular degeneration, bilateral, intermediate dry stage: Secondary | ICD-10-CM | POA: Diagnosis not present

## 2021-04-04 DIAGNOSIS — H43813 Vitreous degeneration, bilateral: Secondary | ICD-10-CM | POA: Diagnosis not present

## 2021-04-04 DIAGNOSIS — H3540 Unspecified peripheral retinal degeneration: Secondary | ICD-10-CM | POA: Diagnosis not present

## 2021-04-04 DIAGNOSIS — H43823 Vitreomacular adhesion, bilateral: Secondary | ICD-10-CM | POA: Diagnosis not present

## 2021-04-05 DIAGNOSIS — H906 Mixed conductive and sensorineural hearing loss, bilateral: Secondary | ICD-10-CM | POA: Diagnosis not present

## 2021-05-31 DIAGNOSIS — H353122 Nonexudative age-related macular degeneration, left eye, intermediate dry stage: Secondary | ICD-10-CM | POA: Diagnosis not present

## 2021-06-13 ENCOUNTER — Other Ambulatory Visit: Payer: Self-pay | Admitting: Family

## 2021-06-30 DIAGNOSIS — H353122 Nonexudative age-related macular degeneration, left eye, intermediate dry stage: Secondary | ICD-10-CM | POA: Diagnosis not present

## 2021-07-08 ENCOUNTER — Inpatient Hospital Stay: Payer: Medicare Other | Attending: Hematology & Oncology

## 2021-07-08 ENCOUNTER — Inpatient Hospital Stay: Payer: Medicare Other

## 2021-07-08 ENCOUNTER — Inpatient Hospital Stay: Payer: Medicare Other | Admitting: Family

## 2021-07-08 ENCOUNTER — Encounter: Payer: Self-pay | Admitting: Family

## 2021-07-08 ENCOUNTER — Other Ambulatory Visit: Payer: Self-pay

## 2021-07-08 ENCOUNTER — Telehealth: Payer: Self-pay | Admitting: Family

## 2021-07-08 VITALS — BP 130/66 | HR 74 | Temp 98.5°F | Resp 17 | Wt 133.8 lb

## 2021-07-08 DIAGNOSIS — M810 Age-related osteoporosis without current pathological fracture: Secondary | ICD-10-CM | POA: Insufficient documentation

## 2021-07-08 DIAGNOSIS — Z853 Personal history of malignant neoplasm of breast: Secondary | ICD-10-CM | POA: Diagnosis not present

## 2021-07-08 DIAGNOSIS — D0512 Intraductal carcinoma in situ of left breast: Secondary | ICD-10-CM | POA: Insufficient documentation

## 2021-07-08 LAB — CBC WITH DIFFERENTIAL (CANCER CENTER ONLY)
Abs Immature Granulocytes: 0.02 10*3/uL (ref 0.00–0.07)
Basophils Absolute: 0 10*3/uL (ref 0.0–0.1)
Basophils Relative: 1 %
Eosinophils Absolute: 0 10*3/uL (ref 0.0–0.5)
Eosinophils Relative: 0 %
HCT: 41.9 % (ref 36.0–46.0)
Hemoglobin: 14 g/dL (ref 12.0–15.0)
Immature Granulocytes: 0 %
Lymphocytes Relative: 19 %
Lymphs Abs: 1.2 10*3/uL (ref 0.7–4.0)
MCH: 31.3 pg (ref 26.0–34.0)
MCHC: 33.4 g/dL (ref 30.0–36.0)
MCV: 93.7 fL (ref 80.0–100.0)
Monocytes Absolute: 0.4 10*3/uL (ref 0.1–1.0)
Monocytes Relative: 6 %
Neutro Abs: 4.5 10*3/uL (ref 1.7–7.7)
Neutrophils Relative %: 74 %
Platelet Count: 189 10*3/uL (ref 150–400)
RBC: 4.47 MIL/uL (ref 3.87–5.11)
RDW: 12.3 % (ref 11.5–15.5)
WBC Count: 6.1 10*3/uL (ref 4.0–10.5)
nRBC: 0 % (ref 0.0–0.2)

## 2021-07-08 LAB — CMP (CANCER CENTER ONLY)
ALT: 9 U/L (ref 0–44)
AST: 11 U/L — ABNORMAL LOW (ref 15–41)
Albumin: 4.6 g/dL (ref 3.5–5.0)
Alkaline Phosphatase: 51 U/L (ref 38–126)
Anion gap: 9 (ref 5–15)
BUN: 16 mg/dL (ref 8–23)
CO2: 30 mmol/L (ref 22–32)
Calcium: 9.8 mg/dL (ref 8.9–10.3)
Chloride: 104 mmol/L (ref 98–111)
Creatinine: 1.09 mg/dL — ABNORMAL HIGH (ref 0.44–1.00)
GFR, Estimated: 51 mL/min — ABNORMAL LOW (ref >60)
Glucose, Bld: 117 mg/dL — ABNORMAL HIGH (ref 70–99)
Potassium: 4.5 mmol/L (ref 3.5–5.1)
Sodium: 143 mmol/L (ref 135–145)
Total Bilirubin: 0.7 mg/dL (ref 0.3–1.2)
Total Protein: 7.1 g/dL (ref 6.5–8.1)

## 2021-07-08 NOTE — Telephone Encounter (Signed)
Scheduled appt per 12/16 los and 12/16 sch msg . Patient is aware of appts for 12/20 zometa and 1 yr f/u

## 2021-07-08 NOTE — Progress Notes (Addendum)
Hematology and Oncology Follow Up Visit  Judith Flynn [id number] 01/23/1941 80 y.o. 07/08/2021   Principle Diagnosis:  Ductal carcinoma in situ of the left breast Osteoporosis   Past Therapy: Prolia stopped 07/08/2021 due to jaw pain and tightness with previous dose   Current Therapy:        Zometa IV Yearly   Interim History:  Judith Flynn is here today for follow-up and Prolia. She states that after her Prolia injection last visit (her first injection of this medication) she experienced jaw pain and tightness for 3 months concerning for drug reaction. She was evaluated by her dentist and they are in agreement with changing her back to Zometa which she has tolerated nicely in the past.  She has had no other issues. Her mammogram in March was negative.  No lymphedema or adenopathy noted on exam.  No fever, chills, n/v, cough, rash, dizziness, SOB, chest pain, palpitations, abdominal pain or changes in bowel or bladder habits.  No swelling, tenderness, numbness or tingling in her extremities.  No falls or syncope.  She has maintained a good appetite and is staying well hydrated. Her weight is stable at 133 lbs.    ECOG Performance Status: 1 - Symptomatic but completely ambulatory  Medications:  Allergies as of 07/08/2021       Reactions   Bupropion Other (See Comments)   tremors   Lisinopril Cough        Medication List        Accurate as of July 08, 2021 11:21 AM. If you have any questions, ask your nurse or doctor.          acetaminophen 325 MG tablet Commonly known as: TYLENOL Take 650 mg by mouth every 6 (six) hours as needed.   aspirin 81 MG tablet Take 81 mg by mouth daily.   atorvastatin 40 MG tablet Commonly known as: LIPITOR TAKE 1 TABLET BY MOUTH  DAILY   buPROPion 150 MG 24 hr tablet Commonly known as: WELLBUTRIN XL TAKE ONE (1) TABLET BY MOUTH EACH DAY   Calcium 1200 1200-1000 MG-UNIT Chew Chew 1 tablet by mouth daily.    hydrochlorothiazide 25 MG tablet Commonly known as: HYDRODIURIL Take 1 tablet (25 mg total) by mouth daily.   latanoprost 0.005 % ophthalmic solution Commonly known as: XALATAN   latanoprost 0.005 % ophthalmic solution Commonly known as: XALATAN 1 drop at bedtime.   omeprazole 40 MG capsule Commonly known as: PRILOSEC TAKE ONE (1) CAPSULE EACH DAY BY MOUTH   Pfizer-BioNT COVID-19 Vac-TriS Susp injection Generic drug: COVID-19 mRNA Vac-TriS (Pfizer) Inject into the muscle.   PreserVision AREDS 2 Caps Take 1 capsule by mouth daily.   Vitamin D3 75 MCG (3000 UT) Tabs Take 1 tablet by mouth daily.   zoledronic acid 5 MG/100ML Soln injection Commonly known as: RECLAST Inject 5 mg into the vein. Annual infusion        Allergies:  Allergies  Allergen Reactions   Bupropion Other (See Comments)    tremors   Lisinopril Cough    Past Medical History, Surgical history, Social history, and Family History were reviewed and updated.  Review of Systems: All other 10 point review of systems is negative.   Physical Exam:  weight is 133 lb 12.8 oz (60.7 kg). Her oral temperature is 98.5 F (36.9 C). Her blood pressure is 130/66. Her respiration is 17 and oxygen saturation is 99%.   Wt Readings from Last 3 Encounters:  07/08/21 133 lb 12.8 oz (  60.7 kg)  07/09/20 142 lb 1.9 oz (64.5 kg)  03/02/20 149 lb 3.2 oz (67.7 kg)    Ocular: Sclerae unicteric, pupils equal, round and reactive to light Ear-nose-throat: Oropharynx clear, dentition fair Lymphatic: No cervical or supraclavicular adenopathy Lungs no rales or rhonchi, good excursion bilaterally Heart regular rate and rhythm, no murmur appreciated Abd soft, nontender, positive bowel sounds MSK no focal spinal tenderness, no joint edema Neuro: non-focal, well-oriented, appropriate affect  Lab Results  Component Value Date   WBC 6.1 07/08/2021   HGB 14.0 07/08/2021   HCT 41.9 07/08/2021   MCV 93.7 07/08/2021   PLT 189  07/08/2021   No results found for: FERRITIN, IRON, TIBC, UIBC, IRONPCTSAT Lab Results  Component Value Date   RBC 4.47 07/08/2021   No results found for: KPAFRELGTCHN, LAMBDASER, KAPLAMBRATIO No results found for: IGGSERUM, IGA, IGMSERUM No results found for: Judith Flynn, SPEI   Chemistry      Component Value Date/Time   NA 140 07/09/2020 1052   NA 146 (H) 06/27/2017 0903   K 4.5 07/09/2020 1052   K 3.9 06/27/2017 0903   CL 102 07/09/2020 1052   CL 106 06/27/2017 0903   CO2 29 07/09/2020 1052   CO2 27 06/27/2017 0903   BUN 12 07/09/2020 1052   BUN 11 06/27/2017 0903   CREATININE 1.04 (H) 07/09/2020 1052   CREATININE 0.8 06/27/2017 0903      Component Value Date/Time   CALCIUM 9.9 07/09/2020 1052   CALCIUM 9.2 06/27/2017 0903   ALKPHOS 59 07/09/2020 1052   ALKPHOS 68 06/27/2017 0903   AST 11 (L) 07/09/2020 1052   ALT 8 07/09/2020 1052   ALT 16 06/27/2017 0903   BILITOT 0.6 07/09/2020 1052       Impression and Plan: Judith Flynn is a very pleasant 80 yo caucasian female with history of ductal carcinoma in situ of the left breast almost 20 years ago. She continues to do quite well and so far there has been no evidence of recurrence.  Mammogram due again in March. Patient will schedule.  Prolia held due to jaw pain and tightness with her last treatment. We will work to get her Zometa approved as she tolerated this nicely in the past.  Follow-up in 1 year.   Lottie Dawson, NP 12/16/202211:21 AM

## 2021-07-12 ENCOUNTER — Other Ambulatory Visit: Payer: Self-pay

## 2021-07-12 ENCOUNTER — Other Ambulatory Visit: Payer: Self-pay | Admitting: Family

## 2021-07-12 ENCOUNTER — Inpatient Hospital Stay: Payer: Medicare Other

## 2021-07-12 VITALS — BP 119/58 | HR 65 | Temp 98.0°F | Resp 20

## 2021-07-12 DIAGNOSIS — D0512 Intraductal carcinoma in situ of left breast: Secondary | ICD-10-CM | POA: Diagnosis not present

## 2021-07-12 DIAGNOSIS — M810 Age-related osteoporosis without current pathological fracture: Secondary | ICD-10-CM

## 2021-07-12 MED ORDER — SODIUM CHLORIDE 0.9 % IV SOLN: Freq: Once | INTRAVENOUS | Status: DC

## 2021-07-12 MED ORDER — ZOLEDRONIC ACID 5 MG/100ML IV SOLN: 5.0000 mg | Freq: Once | INTRAVENOUS | Status: DC

## 2021-07-12 MED ORDER — SODIUM CHLORIDE 0.9 % IV SOLN: INTRAVENOUS | Status: DC

## 2021-07-12 MED ORDER — ZOLEDRONIC ACID 4 MG/100ML IV SOLN
4.0000 mg | Freq: Once | INTRAVENOUS | Status: AC
  Administered 2021-07-12: 09:00:00 4 mg via INTRAVENOUS
  Filled 2021-07-12: qty 100

## 2021-07-12 NOTE — Patient Instructions (Signed)

## 2021-07-30 DIAGNOSIS — H353122 Nonexudative age-related macular degeneration, left eye, intermediate dry stage: Secondary | ICD-10-CM | POA: Diagnosis not present

## 2021-08-09 ENCOUNTER — Other Ambulatory Visit: Payer: Self-pay | Admitting: Family

## 2021-08-09 ENCOUNTER — Other Ambulatory Visit (HOSPITAL_BASED_OUTPATIENT_CLINIC_OR_DEPARTMENT_OTHER): Payer: Self-pay | Admitting: Family

## 2021-08-09 DIAGNOSIS — Z1231 Encounter for screening mammogram for malignant neoplasm of breast: Secondary | ICD-10-CM

## 2021-08-29 DIAGNOSIS — H353122 Nonexudative age-related macular degeneration, left eye, intermediate dry stage: Secondary | ICD-10-CM | POA: Diagnosis not present

## 2021-09-07 ENCOUNTER — Other Ambulatory Visit: Payer: Self-pay | Admitting: Family

## 2021-09-21 ENCOUNTER — Encounter (HOSPITAL_BASED_OUTPATIENT_CLINIC_OR_DEPARTMENT_OTHER): Payer: Self-pay

## 2021-09-21 ENCOUNTER — Other Ambulatory Visit: Payer: Self-pay

## 2021-09-21 ENCOUNTER — Ambulatory Visit (HOSPITAL_BASED_OUTPATIENT_CLINIC_OR_DEPARTMENT_OTHER)
Admission: RE | Admit: 2021-09-21 | Discharge: 2021-09-21 | Disposition: A | Discharge status: Home / Self Care | Payer: Medicare Other | Source: Ambulatory Visit | Attending: Family | Admitting: Family

## 2021-09-21 DIAGNOSIS — Z1231 Encounter for screening mammogram for malignant neoplasm of breast: Secondary | ICD-10-CM | POA: Insufficient documentation

## 2021-09-21 IMAGING — MG MM DIGITAL SCREENING BILAT W/ TOMO AND CAD
6 of 10 series · 6 of 30 positions shown · non-contrast
Comparison: Previous exam(s).

ACR Breast Density Category a: The breast tissue is almost entirely
fatty.

CLINICAL DATA: Screening.

EXAM:
DIGITAL SCREENING BILATERAL MAMMOGRAM WITH TOMOSYNTHESIS AND CAD
TECHNIQUE: Bilateral screening digital craniocaudal and mediolateral oblique
mammograms were obtained. Bilateral screening digital breast
tomosynthesis was performed. The images were evaluated with
computer-aided detection.

[R CC synth-2D]
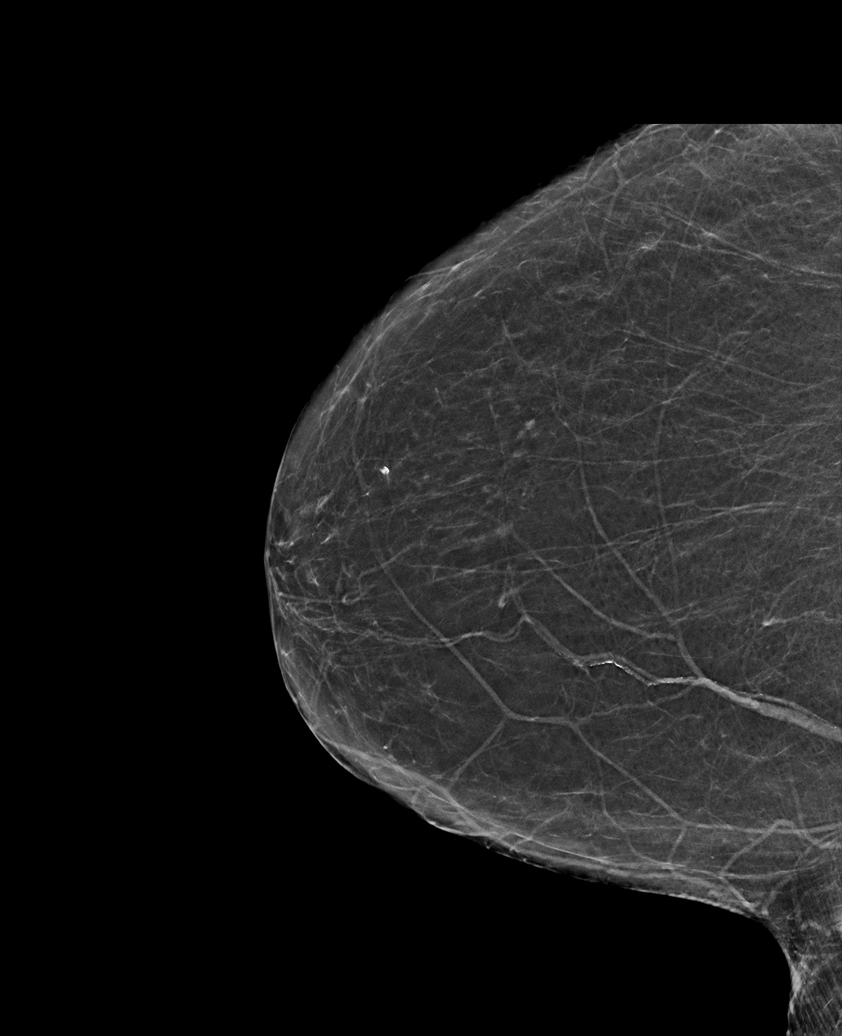

[L MLO synth-2D (1 of 2)]
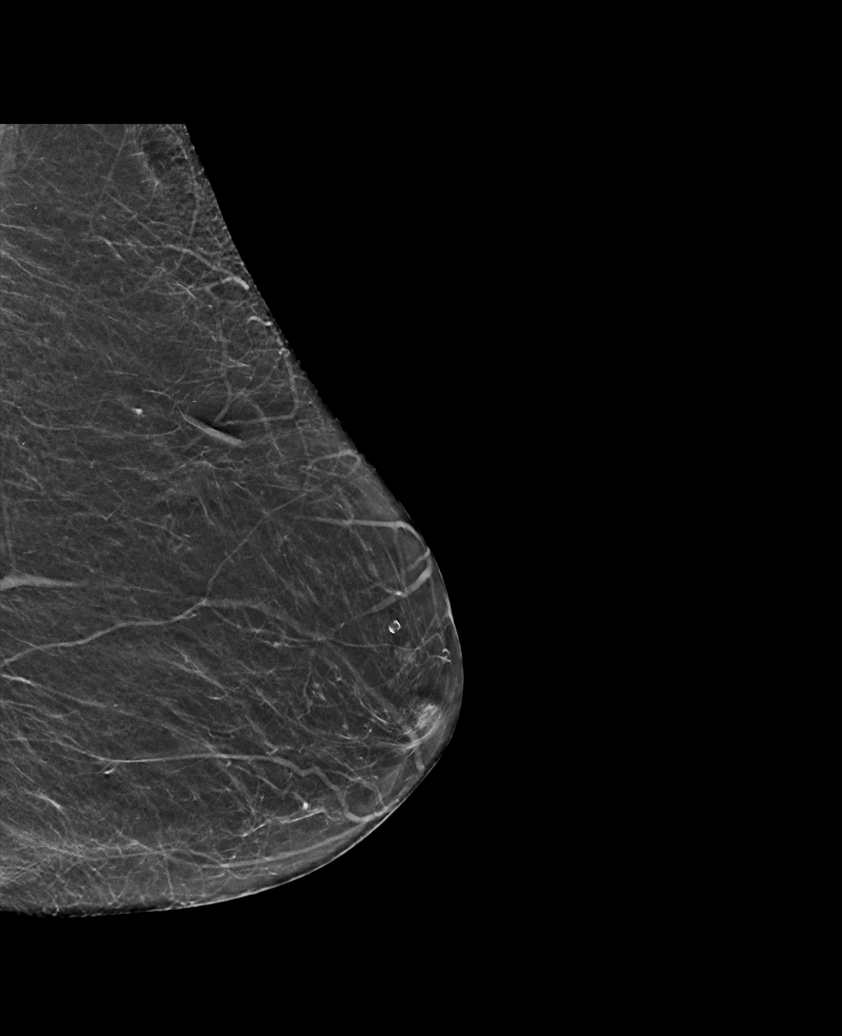

[R MLO synth-2D]
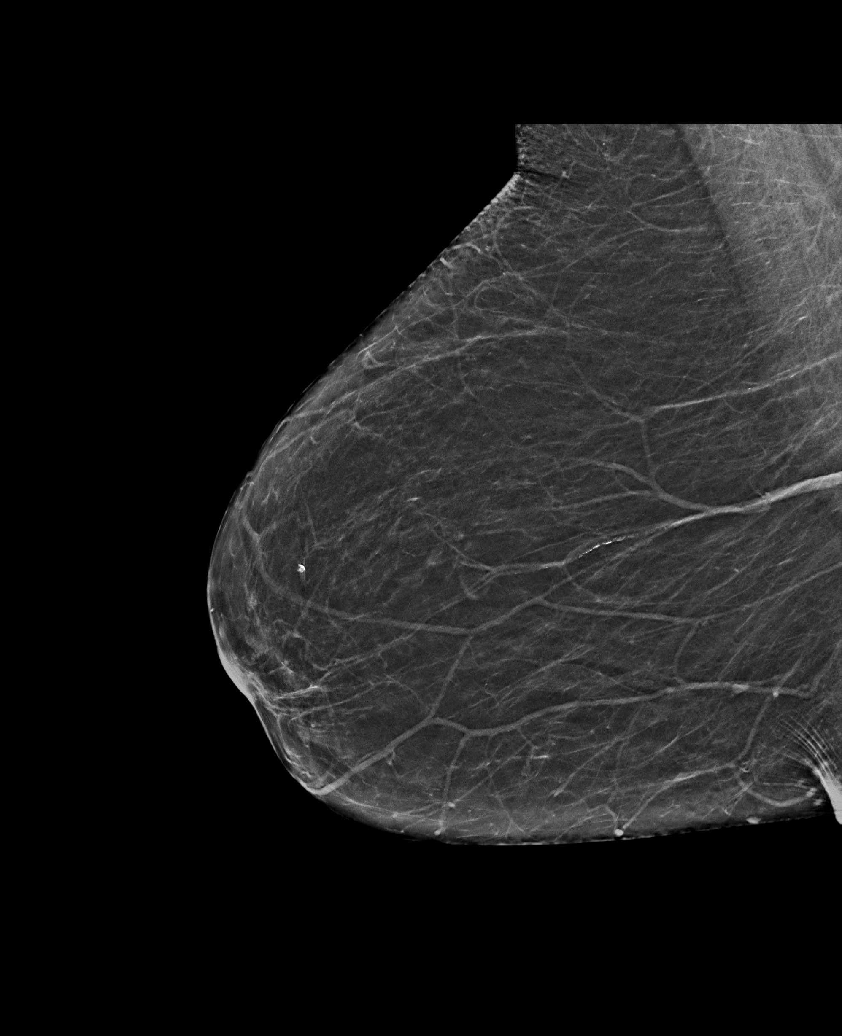

[L CC synth-2D]
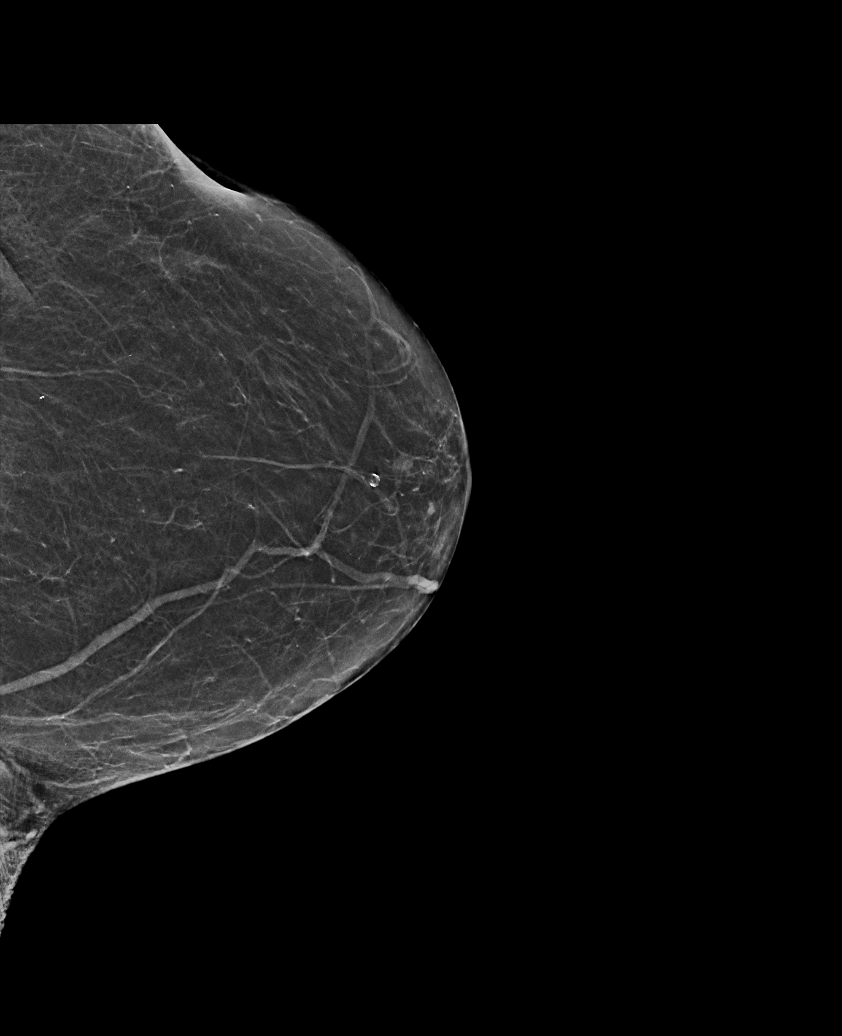

[L MLO synth-2D (2 of 2)]
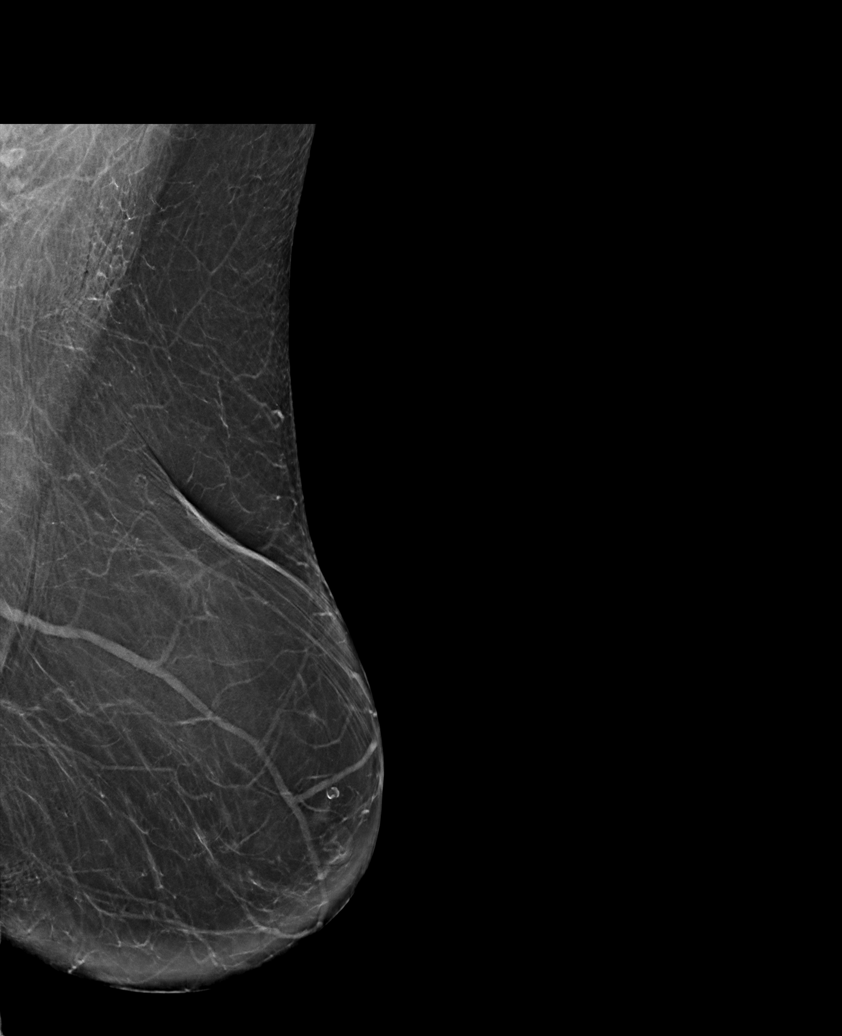

[L CC tomo · tomo slice 25/48.0]
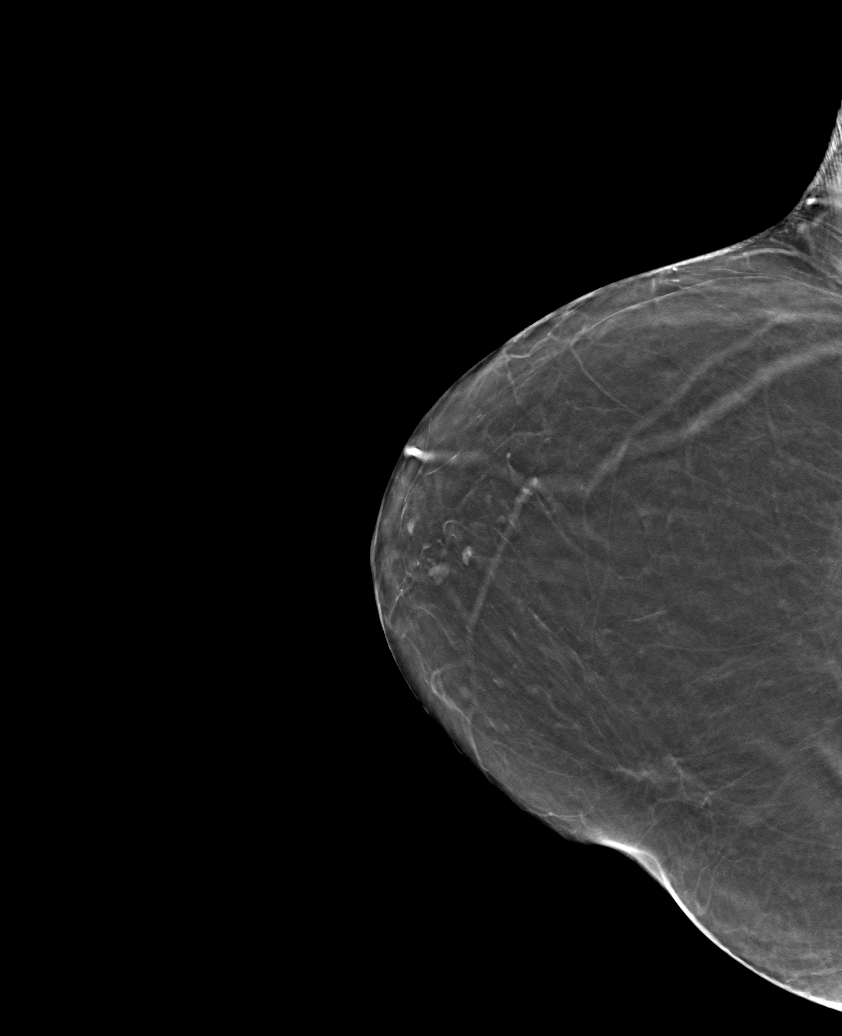

[6 of 30 positions shown; findings below may reference images not displayed]

FINDINGS: There are no findings suspicious for malignancy.
IMPRESSION: No mammographic evidence of malignancy. A result letter of this
screening mammogram will be mailed directly to the patient.

RECOMMENDATION:
Screening mammogram in one year. (Code:0E-3-N98)

BI-RADS CATEGORY  1: Negative.

## 2021-09-23 DIAGNOSIS — H401123 Primary open-angle glaucoma, left eye, severe stage: Secondary | ICD-10-CM | POA: Diagnosis not present

## 2021-09-23 DIAGNOSIS — H353132 Nonexudative age-related macular degeneration, bilateral, intermediate dry stage: Secondary | ICD-10-CM | POA: Diagnosis not present

## 2021-09-23 DIAGNOSIS — H401111 Primary open-angle glaucoma, right eye, mild stage: Secondary | ICD-10-CM | POA: Diagnosis not present

## 2021-09-28 DIAGNOSIS — H353122 Nonexudative age-related macular degeneration, left eye, intermediate dry stage: Secondary | ICD-10-CM | POA: Diagnosis not present

## 2021-09-29 ENCOUNTER — Telehealth: Payer: Self-pay | Admitting: Family

## 2021-09-29 NOTE — Telephone Encounter (Signed)
Pt states she needs second shingle shot..  ? ?Prescription is needed to be sent to [facility]  ? ?Please advise  ?

## 2021-09-30 NOTE — Telephone Encounter (Signed)
Per [facility] 1st shingles administered 01-10-2021 (records updated) injection ordered for second administration. ?

## 2021-10-03 DIAGNOSIS — H35033 Hypertensive retinopathy, bilateral: Secondary | ICD-10-CM | POA: Diagnosis not present

## 2021-10-03 DIAGNOSIS — H43823 Vitreomacular adhesion, bilateral: Secondary | ICD-10-CM | POA: Diagnosis not present

## 2021-10-03 DIAGNOSIS — H43813 Vitreous degeneration, bilateral: Secondary | ICD-10-CM | POA: Diagnosis not present

## 2021-10-03 DIAGNOSIS — H353132 Nonexudative age-related macular degeneration, bilateral, intermediate dry stage: Secondary | ICD-10-CM | POA: Diagnosis not present

## 2021-10-28 DIAGNOSIS — H353122 Nonexudative age-related macular degeneration, left eye, intermediate dry stage: Secondary | ICD-10-CM | POA: Diagnosis not present

## 2021-11-27 DIAGNOSIS — H353122 Nonexudative age-related macular degeneration, left eye, intermediate dry stage: Secondary | ICD-10-CM | POA: Diagnosis not present

## 2021-12-27 DIAGNOSIS — H353122 Nonexudative age-related macular degeneration, left eye, intermediate dry stage: Secondary | ICD-10-CM | POA: Diagnosis not present

## 2022-01-13 ENCOUNTER — Ambulatory Visit: Payer: Medicare Other | Admitting: Family

## 2022-01-16 ENCOUNTER — Ambulatory Visit (INDEPENDENT_AMBULATORY_CARE_PROVIDER_SITE_OTHER): Payer: Medicare Other | Admitting: Family

## 2022-01-16 VITALS — BP 139/56 | HR 71 | Temp 98.3°F | Resp 16 | Wt 132.0 lb

## 2022-01-16 DIAGNOSIS — E785 Hyperlipidemia, unspecified: Secondary | ICD-10-CM

## 2022-01-16 DIAGNOSIS — I10 Essential (primary) hypertension: Secondary | ICD-10-CM

## 2022-01-16 DIAGNOSIS — M8000XD Age-related osteoporosis with current pathological fracture, unspecified site, subsequent encounter for fracture with routine healing: Secondary | ICD-10-CM | POA: Diagnosis not present

## 2022-01-16 LAB — COMPREHENSIVE METABOLIC PANEL
ALT: 8 U/L (ref 0–35)
AST: 9 U/L (ref 0–37)
Albumin: 4.1 g/dL (ref 3.5–5.2)
Alkaline Phosphatase: 48 U/L (ref 39–117)
BUN: 20 mg/dL (ref 6–23)
CO2: 28 mEq/L (ref 19–32)
Calcium: 9 mg/dL (ref 8.4–10.5)
Chloride: 104 mEq/L (ref 96–112)
Creatinine, Ser: 0.97 mg/dL (ref 0.40–1.20)
GFR: 54.43 mL/min — ABNORMAL LOW (ref >60.00)
Glucose, Bld: 105 mg/dL — ABNORMAL HIGH (ref 70–99)
Potassium: 3.7 mEq/L (ref 3.5–5.1)
Sodium: 142 mEq/L (ref 135–145)
Total Bilirubin: 0.8 mg/dL (ref 0.2–1.2)
Total Protein: 6.1 g/dL (ref 6.0–8.3)

## 2022-01-16 LAB — LIPID PANEL
Cholesterol: 157 mg/dL (ref 0–200)
HDL: 59.3 mg/dL (ref >39.00)
LDL Cholesterol: 86 mg/dL (ref 0–99)
NonHDL: 98.09
Total CHOL/HDL Ratio: 3
Triglycerides: 59 mg/dL (ref 0.0–149.0)
VLDL: 11.8 mg/dL (ref 0.0–40.0)

## 2022-01-16 MED ORDER — HYDROCHLOROTHIAZIDE 25 MG PO TABS: 25.0000 mg | ORAL_TABLET | Freq: Every day | ORAL | 1 refills | Status: DC

## 2022-01-16 MED ORDER — ATORVASTATIN CALCIUM 40 MG PO TABS: 40.0000 mg | ORAL_TABLET | Freq: Every day | ORAL | 3 refills | Status: DC

## 2022-01-16 MED ORDER — OMEPRAZOLE 40 MG PO CPDR: DELAYED_RELEASE_CAPSULE | ORAL | 1 refills | Status: DC

## 2022-01-16 NOTE — Assessment & Plan Note (Signed)
BP Readings from Last 3 Encounters:  01/16/22 (!) 139/56  07/12/21 (!) 119/58  07/08/21 130/66   Stable on hctz. Continue same.

## 2022-01-16 NOTE — Progress Notes (Signed)
Subjective:   By signing my name below, I, Judith Flynn, attest that this documentation has been prepared under the direction and in the presence of Judith Craze, NP 01/16/2022    Patient ID: Judith Flynn, female    DOB: 12-29-1940, 81 y.o.   MRN: [id number]  Chief Complaint  Patient presents with   Hypertension    Here for follow up   Hyperlipidemia    Here for follow up    Patient is in today for a follow up visit.   Bone density- Her last bone density scan was in 2020 and she is interested in scheduling another one.   Cholesterol- She continues taking atorvastatin and reports no new issues while taking it. She is interested in checking her cholesterol during her next lab work.  Lab Results  Component Value Date   CHOL 154 02/25/2019   HDL 57.00 02/25/2019   LDLCALC 76 02/25/2019   LDLDIRECT 100.0 02/19/2015   TRIG 104.0 02/25/2019   CHOLHDL 3 02/25/2019    Health Maintenance Due  Topic Date Due   COVID-19 Vaccine (4 - Pfizer series) 09/13/2021    Past Medical History:  Diagnosis Date   Breast cancer (HCC) 2000   Left Breast Cancer   Cancer (HCC) 2000   breast- left breast- s/p lumpectomy and radiation   Colon polyp    Hearing loss    Hyperlipidemia    Hypertension    Osteoporosis    Personal history of radiation therapy 2000   Left Breast Cancer   Tobacco abuse     Past Surgical History:  Procedure Laterality Date   BREAST BIOPSY Left 2000   BREAST LUMPECTOMY Left 2000   CATARACT EXTRACTION, BILATERAL Bilateral July and August 2015   CHOLECYSTECTOMY     surgery on ear drum     right    Family History  Problem Relation Age of Onset   Alcohol abuse Mother    Arthritis Mother    Hypertension Mother 61   Heart disease Mother        CHF   Alcohol abuse Father    Arthritis Father    Hypertension Father 7   Heart attack Father        Died of MI at age 16    Social History   Socioeconomic History   Marital status: Widowed     Spouse name: Not on file   Number of children: 3   Years of education: Not on file   Highest education level: Not on file  Occupational History   Occupation: retired    Associate Professor: RETIRED  Tobacco Use   Smoking status: Former    Packs/day: 0.50    Types: Cigarettes   Smokeless tobacco: Never   Tobacco comments:    patient said she quit 10/2019  Vaping Use   Vaping Use: Never used  Substance and Sexual Activity   Alcohol use: Yes    Alcohol/week: 0.0 standard drinks of alcohol    Comment: Rare   Drug use: No   Sexual activity: Yes  Other Topics Concern   Not on file  Social History Narrative   Widow/ widower   Regular exercise- yes   Originally from [place]         Social Determinants of Health   Financial Resource Strain: Not on file  Food Insecurity: Not on file  Transportation Needs: Not on file  Physical Activity: Not on file  Stress: Not on file  Social Connections: Not on file  Intimate Partner Violence: Not on file    Outpatient Medications Prior to Visit  Medication Sig Dispense Refill   acetaminophen (TYLENOL) 325 MG tablet Take 650 mg by mouth every 6 (six) hours as needed.     aspirin 81 MG tablet Take 81 mg by mouth daily. (Patient not taking: Reported on 07/09/2020)     Calcium Carbonate-Vit D-Min (CALCIUM 1200) 1200-1000 MG-UNIT CHEW Chew 1 tablet by mouth daily.     Cholecalciferol (VITAMIN D3) 3000 units TABS Take 1 tablet by mouth daily. 30 tablet    COVID-19 mRNA Vac-TriS, Pfizer, (PFIZER-BIONT COVID-19 VAC-TRIS) SUSP injection Inject into the muscle. 0.3 mL 0   latanoprost (XALATAN) 0.005 % ophthalmic solution 1 drop at bedtime.     Multiple Vitamins-Minerals (PRESERVISION AREDS 2) CAPS Take 1 capsule by mouth daily.     zoledronic acid (RECLAST) 5 MG/100ML SOLN injection Inject 5 mg into the vein. Annual infusion     atorvastatin (LIPITOR) 40 MG tablet TAKE 1 TABLET BY MOUTH  DAILY 90 tablet 1   hydrochlorothiazide (HYDRODIURIL) 25 MG tablet Take 1  tablet (25 mg total) by mouth daily. 90 tablet 1   latanoprost (XALATAN) 0.005 % ophthalmic solution      omeprazole (PRILOSEC) 40 MG capsule TAKE ONE (1) CAPSULE EACH DAY BY MOUTH *DUE FOR APPOINTMENT* 30 capsule 0   No facility-administered medications prior to visit.    Allergies  Allergen Reactions   Prolia [Denosumab]     Jaw pain and tightness   Bupropion Other (See Comments)    tremors   Lisinopril Cough    ROS See HPI    Objective:    Physical Exam Constitutional:      General: She is not in acute distress.    Appearance: Normal appearance. She is not ill-appearing.  HENT:     Head: Normocephalic and atraumatic.     Right Ear: External ear normal.     Left Ear: External ear normal.  Eyes:     Extraocular Movements: Extraocular movements intact.     Pupils: Pupils are equal, round, and reactive to light.  Cardiovascular:     Rate and Rhythm: Normal rate and regular rhythm.     Heart sounds: Normal heart sounds. No murmur heard.    No gallop.  Pulmonary:     Effort: Pulmonary effort is normal. No respiratory distress.     Breath sounds: Normal breath sounds. No wheezing or rales.  Skin:    General: Skin is warm and dry.  Neurological:     Mental Status: She is alert and oriented to person, place, and time.  Psychiatric:        Judgment: Judgment normal.     BP (!) 139/56 (BP Location: Right Arm, Patient Position: Sitting, Cuff Size: Small)   Pulse 71   Temp 98.3 F (36.8 C) (Oral)   Resp 16   Wt 132 lb (59.9 kg)   SpO2 97%   BMI 26.66 kg/m  Wt Readings from Last 3 Encounters:  01/16/22 132 lb (59.9 kg)  07/08/21 133 lb 12.8 oz (60.7 kg)  07/09/20 142 lb 1.9 oz (64.5 kg)       Assessment & Flynn:   Problem List Items Addressed This Visit       Unprioritized   Osteoporosis - Primary    Continues annual reclast at Hematology. Due for follow up bone density.       Relevant Orders   DG Bone Density   Hyperlipidemia  Lab Results   Component Value Date   CHOL 154 02/25/2019   HDL 57.00 02/25/2019   LDLCALC 76 02/25/2019   LDLDIRECT 100.0 02/19/2015   TRIG 104.0 02/25/2019   CHOLHDL 3 02/25/2019  Tolerating statin. Due for repeat lipid panel.       Relevant Medications   atorvastatin (LIPITOR) 40 MG tablet   hydrochlorothiazide (HYDRODIURIL) 25 MG tablet   Other Relevant Orders   Lipid panel   Comp Met (CMET)   Essential hypertension    BP Readings from Last 3 Encounters:  01/16/22 (!) 139/56  07/12/21 (!) 119/58  07/08/21 130/66  Stable on hctz. Continue same.       Relevant Medications   atorvastatin (LIPITOR) 40 MG tablet   hydrochlorothiazide (HYDRODIURIL) 25 MG tablet     Meds ordered this encounter  Medications   atorvastatin (LIPITOR) 40 MG tablet    Sig: Take 1 tablet (40 mg total) by mouth daily.    Dispense:  90 tablet    Refill:  3    Supervising MD:  Danise Edge   DEA: [id number]   hydrochlorothiazide (HYDRODIURIL) 25 MG tablet    Sig: Take 1 tablet (25 mg total) by mouth daily.    Dispense:  90 tablet    Refill:  1    Supervising MD:  Danise Edge   DEA: [id number]   omeprazole (PRILOSEC) 40 MG capsule    Sig: TAKE ONE (1) CAPSULE EACH DAY BY MOUTH    Dispense:  90 capsule    Refill:  1    Order Specific Question:   Supervising Provider    Answer:   Danise Edge A [[id number]]    I, Lemont Fillers, NP, personally preformed the services described in this documentation.  All medical record entries made by the scribe were at my direction and in my presence.  I have reviewed the chart and discharge instructions (if applicable) and agree that the record reflects my personal performance and is accurate and complete. 01/16/2022   I,Judith Flynn,acting as a Neurosurgeon for Lemont Fillers, NP.,have documented all relevant documentation on the behalf of Lemont Fillers, NP,as directed by  Lemont Fillers, NP while in the presence of Lemont Fillers, NP.   Lemont Fillers, NP

## 2022-01-16 NOTE — Assessment & Plan Note (Signed)
Continues annual reclast at Hematology. Due for follow up bone density.

## 2022-01-26 DIAGNOSIS — H353122 Nonexudative age-related macular degeneration, left eye, intermediate dry stage: Secondary | ICD-10-CM | POA: Diagnosis not present

## 2022-02-06 DIAGNOSIS — D2372 Other benign neoplasm of skin of left lower limb, including hip: Secondary | ICD-10-CM | POA: Diagnosis not present

## 2022-02-13 ENCOUNTER — Ambulatory Visit (HOSPITAL_BASED_OUTPATIENT_CLINIC_OR_DEPARTMENT_OTHER)
Admission: RE | Admit: 2022-02-13 | Discharge: 2022-02-13 | Disposition: A | Discharge status: Home / Self Care | Payer: Medicare Other | Source: Ambulatory Visit | Attending: Family | Admitting: Family

## 2022-02-13 DIAGNOSIS — Z780 Asymptomatic menopausal state: Secondary | ICD-10-CM | POA: Diagnosis not present

## 2022-02-13 DIAGNOSIS — Z8262 Family history of osteoporosis: Secondary | ICD-10-CM | POA: Diagnosis not present

## 2022-02-13 DIAGNOSIS — M8000XD Age-related osteoporosis with current pathological fracture, unspecified site, subsequent encounter for fracture with routine healing: Secondary | ICD-10-CM | POA: Diagnosis not present

## 2022-02-13 DIAGNOSIS — Z13820 Encounter for screening for osteoporosis: Secondary | ICD-10-CM | POA: Diagnosis not present

## 2022-02-13 DIAGNOSIS — M8588 Other specified disorders of bone density and structure, other site: Secondary | ICD-10-CM | POA: Diagnosis not present

## 2022-02-25 DIAGNOSIS — H353122 Nonexudative age-related macular degeneration, left eye, intermediate dry stage: Secondary | ICD-10-CM | POA: Diagnosis not present

## 2022-03-27 DIAGNOSIS — H353122 Nonexudative age-related macular degeneration, left eye, intermediate dry stage: Secondary | ICD-10-CM | POA: Diagnosis not present

## 2022-03-31 ENCOUNTER — Ambulatory Visit (INDEPENDENT_AMBULATORY_CARE_PROVIDER_SITE_OTHER): Payer: Medicare Other | Admitting: *Deleted

## 2022-03-31 VITALS — BP 122/75 | HR 71 | Ht 59.0 in | Wt 128.0 lb

## 2022-03-31 DIAGNOSIS — Z0000 Encounter for general adult medical examination without abnormal findings: Secondary | ICD-10-CM

## 2022-03-31 NOTE — Patient Instructions (Signed)
Judith Flynn , Thank you for taking time to come for your Medicare Wellness Visit. I appreciate your ongoing commitment to your health goals. Please review the following plan we discussed and let me know if I can assist you in the future.   These are the goals we discussed:  Goals      Increase water intake     Weight (lb) < 135 lb (61.2 kg)        This is a list of the screening recommended for you and due dates:  Health Maintenance  Topic Date Due   COVID-19 Vaccine (4 - Pfizer risk series) 07/08/2021   Flu Shot  02/21/2022   Mammogram  09/22/2022   Tetanus Vaccine  03/02/2030   Pneumonia Vaccine  Completed   DEXA scan (bone density measurement)  Completed   Zoster (Shingles) Vaccine  Completed   HPV Vaccine  Aged Out      Next appointment: Follow up in one year for your annual wellness visit    Preventive Care 30 Years and Older, Female Preventive care refers to lifestyle choices and visits with your health care provider that can promote health and wellness. What does preventive care include? A yearly physical exam. This is also called an annual well check. Dental exams once or twice a year. Routine eye exams. Ask your health care provider how often you should have your eyes checked. Personal lifestyle choices, including: Daily care of your teeth and gums. Regular physical activity. Eating a healthy diet. Avoiding tobacco and drug use. Limiting alcohol use. Practicing safe sex. Taking low-dose aspirin every day. Taking vitamin and mineral supplements as recommended by your health care provider. What happens during an annual well check? The services and screenings done by your health care provider during your annual well check will depend on your age, overall health, lifestyle risk factors, and family history of disease. Counseling  Your health care provider may ask you questions about your: Alcohol use. Tobacco use. Drug use. Emotional well-being. Home and  relationship well-being. Sexual activity. Eating habits. History of falls. Memory and ability to understand (cognition). Work and work Statistician. Reproductive health. Screening  You may have the following tests or measurements: Height, weight, and BMI. Blood pressure. Lipid and cholesterol levels. These may be checked every 5 years, or more frequently if you are over 63 years old. Skin check. Lung cancer screening. You may have this screening every year starting at age 65 if you have a 30-pack-year history of smoking and currently smoke or have quit within the past 15 years. Fecal occult blood test (FOBT) of the stool. You may have this test every year starting at age 20. Flexible sigmoidoscopy or colonoscopy. You may have a sigmoidoscopy every 5 years or a colonoscopy every 10 years starting at age 25. Hepatitis C blood test. Hepatitis B blood test. Sexually transmitted disease (STD) testing. Diabetes screening. This is done by checking your blood sugar (glucose) after you have not eaten for a while (fasting). You may have this done every 1-3 years. Bone density scan. This is done to screen for osteoporosis. You may have this done starting at age 59. Mammogram. This may be done every 1-2 years. Talk to your health care provider about how often you should have regular mammograms. Talk with your health care provider about your test results, treatment options, and if necessary, the need for more tests. Vaccines  Your health care provider may recommend certain vaccines, such as: Influenza vaccine. This is recommended  every year. Tetanus, diphtheria, and acellular pertussis (Tdap, Td) vaccine. You may need a Td booster every 10 years. Zoster vaccine. You may need this after age 89. Pneumococcal 13-valent conjugate (PCV13) vaccine. One dose is recommended after age 80. Pneumococcal polysaccharide (PPSV23) vaccine. One dose is recommended after age 40. Talk to your health care provider  about which screenings and vaccines you need and how often you need them. This information is not intended to replace advice given to you by your health care provider. Make sure you discuss any questions you have with your health care provider. Document Released: 08/06/2015 Document Revised: 03/29/2016 Document Reviewed: 05/11/2015 Elsevier Interactive Patient Education  2017 [place] Prevention in the Home Falls can cause injuries. They can happen to people of all ages. There are many things you can do to make your home safe and to help prevent falls. What can I do on the outside of my home? Regularly fix the edges of walkways and driveways and fix any cracks. Remove anything that might make you trip as you walk through a door, such as a raised step or threshold. Trim any bushes or trees on the path to your home. Use bright outdoor lighting. Clear any walking paths of anything that might make someone trip, such as rocks or tools. Regularly check to see if handrails are loose or broken. Make sure that both sides of any steps have handrails. Any raised decks and porches should have guardrails on the edges. Have any leaves, snow, or ice cleared regularly. Use sand or salt on walking paths during winter. Clean up any spills in your garage right away. This includes oil or grease spills. What can I do in the bathroom? Use night lights. Install grab bars by the toilet and in the tub and shower. Do not use towel bars as grab bars. Use non-skid mats or decals in the tub or shower. If you need to sit down in the shower, use a plastic, non-slip stool. Keep the floor dry. Clean up any water that spills on the floor as soon as it happens. Remove soap buildup in the tub or shower regularly. Attach bath mats securely with double-sided non-slip rug tape. Do not have throw rugs and other things on the floor that can make you trip. What can I do in the bedroom? Use night lights. Make sure  that you have a light by your bed that is easy to reach. Do not use any sheets or blankets that are too big for your bed. They should not hang down onto the floor. Have a firm chair that has side arms. You can use this for support while you get dressed. Do not have throw rugs and other things on the floor that can make you trip. What can I do in the kitchen? Clean up any spills right away. Avoid walking on wet floors. Keep items that you use a lot in easy-to-reach places. If you need to reach something above you, use a strong step stool that has a grab bar. Keep electrical cords out of the way. Do not use floor polish or wax that makes floors slippery. If you must use wax, use non-skid floor wax. Do not have throw rugs and other things on the floor that can make you trip. What can I do with my stairs? Do not leave any items on the stairs. Make sure that there are handrails on both sides of the stairs and use them. Fix handrails that are broken  or loose. Make sure that handrails are as long as the stairways. Check any carpeting to make sure that it is firmly attached to the stairs. Fix any carpet that is loose or worn. Avoid having throw rugs at the top or bottom of the stairs. If you do have throw rugs, attach them to the floor with carpet tape. Make sure that you have a light switch at the top of the stairs and the bottom of the stairs. If you do not have them, ask someone to add them for you. What else can I do to help prevent falls? Wear shoes that: Do not have high heels. Have rubber bottoms. Are comfortable and fit you well. Are closed at the toe. Do not wear sandals. If you use a stepladder: Make sure that it is fully opened. Do not climb a closed stepladder. Make sure that both sides of the stepladder are locked into place. Ask someone to hold it for you, if possible. Clearly mark and make sure that you can see: Any grab bars or handrails. First and last steps. Where the edge of  each step is. Use tools that help you move around (mobility aids) if they are needed. These include: Canes. Walkers. Scooters. Crutches. Turn on the lights when you go into a dark area. Replace any light bulbs as soon as they burn out. Set up your furniture so you have a clear path. Avoid moving your furniture around. If any of your floors are uneven, fix them. If there are any pets around you, be aware of where they are. Review your medicines with your doctor. Some medicines can make you feel dizzy. This can increase your chance of falling. Ask your doctor what other things that you can do to help prevent falls. This information is not intended to replace advice given to you by your health care provider. Make sure you discuss any questions you have with your health care provider. Document Released: 05/06/2009 Document Revised: 12/16/2015 Document Reviewed: 08/14/2014 Elsevier Interactive Patient Education  2017 [employer].

## 2022-03-31 NOTE — Progress Notes (Signed)
Subjective:   Judith Flynn is a 81 y.o. female who presents for Medicare Annual (Subsequent) preventive examination.  Review of Systems    Defer to PCP Cardiac Risk Factors include: advanced age (>76mn, >>31women);hypertension;dyslipidemia     Objective:    Today's Vitals   03/31/22 1010  BP: 122/75  Pulse: 71  Weight: 128 lb (58.1 kg)  Height: '4\' 11"'$  (1.499 m)   Body mass index is 25.85 kg/m.     03/31/2022   10:14 AM 07/08/2021   11:17 AM 07/09/2020   11:10 AM 06/03/2019    9:09 AM 07/10/2018   11:51 AM 06/27/2017    9:23 AM 07/04/2016    8:14 AM  Advanced Directives  Does Patient Have a Medical Advance Directive? No No No No No No No  Would patient like information on creating a medical advance directive? No - Patient declined No - Patient declined No - Patient declined No - Patient declined No - Patient declined No - Patient declined Yes (MAU/Ambulatory/Procedural Areas - Information given)    Current Medications (verified) Outpatient Encounter Medications as of 03/31/2022  Medication Sig   acetaminophen (TYLENOL) 325 MG tablet Take 650 mg by mouth every 6 (six) hours as needed.   aspirin 81 MG tablet Take 81 mg by mouth daily. (Patient not taking: Reported on 07/09/2020)   atorvastatin (LIPITOR) 40 MG tablet Take 1 tablet (40 mg total) by mouth daily.   Calcium Carbonate-Vit D-Min (CALCIUM 1200) 1200-1000 MG-UNIT CHEW Chew 1 tablet by mouth daily.   Cholecalciferol (VITAMIN D3) 3000 units TABS Take 1 tablet by mouth daily.   COVID-19 mRNA Vac-TriS, Pfizer, (PFIZER-BIONT COVID-19 VAC-TRIS) SUSP injection Inject into the muscle.   hydrochlorothiazide (HYDRODIURIL) 25 MG tablet Take 1 tablet (25 mg total) by mouth daily.   latanoprost (XALATAN) 0.005 % ophthalmic solution 1 drop at bedtime.   Multiple Vitamins-Minerals (PRESERVISION AREDS 2) CAPS Take 1 capsule by mouth daily.   omeprazole (PRILOSEC) 40 MG capsule TAKE ONE (1) CAPSULE EACH DAY BY MOUTH   zoledronic  acid (RECLAST) 5 MG/100ML SOLN injection Inject 5 mg into the vein. Annual infusion   No facility-administered encounter medications on file as of 03/31/2022.    Allergies (verified) Prolia [denosumab], Bupropion, and Lisinopril   History: Past Medical History:  Diagnosis Date   [place]   [place]   [place]   breast- left breast- s/p lumpectomy and radiation   Colon polyp    Hearing loss    Hyperlipidemia    Hypertension    Osteoporosis    Personal history of radiation therapy 2000   Left Breast Cancer   Tobacco abuse    Past Surgical History:  Procedure Laterality Date   BREAST BIOPSY Left 2000   BREAST LUMPECTOMY Left 2000   CATARACT EXTRACTION, BILATERAL Bilateral July and August 2015   CHOLECYSTECTOMY     surgery on ear drum     right   Family History  Problem Relation Age of Onset   Alcohol abuse Mother    Arthritis Mother    Hypertension Mother 75  Heart disease Mother        CHF   Alcohol abuse Father    Arthritis Father    Hypertension Father 734  Heart attack Father        Died of MI at age 83  Social History   Socioeconomic History   Marital status: Widowed    Spouse name:  Not on file   Number of children: 3   Years of education: Not on file   Highest education level: Not on file  Occupational History   Occupation: retired    Fish farm manager: RETIRED  Tobacco Use   Smoking status: Former    Packs/day: 0.50    Types: Cigarettes   Smokeless tobacco: Never   Tobacco comments:    patient said she quit 10/2019  Vaping Use   Vaping Use: Never used  Substance and Sexual Activity   Alcohol use: Yes    Alcohol/week: 0.0 standard drinks of alcohol    Comment: Rare   Drug use: No   Sexual activity: Yes  Other Topics Concern   Not on file  Social History Narrative   Widow/ widower   Regular exercise- yes   Originally from [place] Strain: Low Risk   (03/31/2022)   Overall Financial Resource Strain (CARDIA)    Difficulty of Paying Living Expenses: Not hard at all  Food Insecurity: No Food Insecurity (03/31/2022)   Hunger Vital Sign    Worried About Running Out of Food in the Last Year: Never true    Ran Out of Food in the Last Year: Never true  Transportation Needs: No Transportation Needs (03/31/2022)   PRAPARE - Hydrologist (Medical): No    Lack of Transportation (Non-Medical): No  Physical Activity: Inactive (03/31/2022)   Exercise Vital Sign    Days of Exercise per Week: 0 days    Minutes of Exercise per Session: 0 min  Stress: No Stress Concern Present (03/31/2022)   [place]    Feeling of Stress : Not at all  Social Connections: Socially Isolated (03/31/2022)   Social Connection and Isolation Panel [NHANES]    Frequency of Communication with Friends and Family: Three times a week    Frequency of Social Gatherings with Friends and Family: More than three times a week    Attends Religious Services: Never    Marine scientist or Organizations: No    Attends Archivist Meetings: Never    Marital Status: Widowed    Tobacco Counseling Counseling given: Not Answered Tobacco comments: patient said she quit 10/2019   Clinical Intake:  Pre-visit preparation completed: Yes  Pain : No/denies pain     Diabetes: No  How often do you need to have someone help you when you read instructions, pamphlets, or other written materials from your doctor or pharmacy?: 1 - Never  Diabetic? No  Interpreter Needed?: No Activities of Daily Living    03/31/2022   10:25 AM  In your present state of health, do you have any difficulty performing the following activities:  Hearing? 1  Comment wears hearing aids  Vision? 0  Difficulty concentrating or making decisions? 0  Walking or climbing stairs? 0  Dressing or bathing? 0  Doing  errands, shopping? 0  Preparing Food and eating ? N  Using the Toilet? N  In the past six months, have you accidently leaked urine? N  Do you have problems with loss of bowel control? N  Managing your Medications? N  Managing your Finances? N  Housekeeping or managing your Housekeeping? N  Comment has housekeeper but can perform her chores independently    Patient Care Team: Debbrah Alar, NP as PCP - General Volanda Napoleon, MD as  Attending Physician (Hematology and Oncology)  Indicate any recent Medical Services you may have received from other than [facility] providers in the past year (date may be approximate).     Assessment:   This is a routine wellness examination for Laconya.  Hearing/Vision screen No results found.  Dietary issues and exercise activities discussed: Current Exercise Habits: Home exercise routine, Type of exercise: walking, Time (Minutes): 15, Frequency (Times/Week): 4, Weekly Exercise (Minutes/Week): 60, Exercise limited by: None identified   Goals Addressed   None    Depression Screen    03/31/2022   10:17 AM 01/16/2022    9:58 AM 06/23/2020    9:08 AM 06/03/2019    9:09 AM 07/04/2016    8:17 AM 12/13/2015   10:02 AM 08/05/2014    9:08 AM  PHQ 2/9 Scores  PHQ - 2 Score 0 0 0 0 0 0 0    Fall Risk    03/31/2022   10:15 AM 01/16/2022    9:58 AM 06/03/2019    9:09 AM 07/04/2016    8:17 AM 05/31/2016   10:00 AM  [place] in the past year? 0 0 0 No No  Number falls in past yr: 0 0 0    Injury with Fall? 0 0 0    Risk for fall due to : No Fall Risks      Follow up Follow up appointment        [place]:  Any stairs in or around the home? Yes  If so, are there any without handrails? No  Home free of loose throw rugs in walkways, pet beds, electrical cords, etc? Yes  Adequate lighting in your home to reduce risk of falls? Yes   ASSISTIVE DEVICES UTILIZED TO PREVENT FALLS:  Life alert? No  Use of a  cane, walker or w/c? No  Grab bars in the bathroom? Yes  Shower chair or bench in shower? No  Elevated toilet seat or a handicapped toilet? No   TIMED UP AND GO:  Was the test performed? Yes .  Length of time to ambulate 10 feet: 5 sec.   Gait steady and fast without use of assistive device  Cognitive Function:    07/04/2016    8:18 AM  MMSE - Mini Mental State Exam  Orientation to time 5  Orientation to Place 5  Registration 3  Attention/ Calculation 5  Recall 2  Language- name 2 objects 2  Language- repeat 1  Language- follow 3 step command 3  Language- read & follow direction 1  Write a sentence 1  Copy design 1  Total score 29        03/31/2022   10:31 AM  6CIT Screen  What Year? 0 points  What month? 0 points  What time? 0 points  Count back from 20 0 points  Months in reverse 0 points  Repeat phrase 0 points  Total Score 0 points    Immunizations Immunization History  Administered Date(s) Administered   Fluad Quad(high Dose 65+) 05/23/2019, 05/19/2020   Influenza Split 04/19/2011, 05/01/2012, 05/13/2021   Influenza Whole 05/03/2009, 06/03/2009, 05/02/2010   Influenza, High Dose Seasonal PF 06/10/2013, 05/05/2015, 05/31/2016, 07/12/2017, 05/16/2018   Influenza,inj,Quad PF,6+ Mos 05/22/2014   PFIZER Comirnaty(Gray Top)Covid-19 Tri-Sucrose Vaccine 11/26/2020   PFIZER(Purple Top)SARS-COV-2 Vaccination 08/20/2019   [place] Pediatric Vaccine(47mo to <550yr 05/13/2021   Pneumococcal Conjugate-13 02/10/2014   Pneumococcal Polysaccharide-23 05/07/2006, 06/03/2009   Td 06/03/2009,  03/02/2020   Zoster Recombinat (Shingrix) 01/10/2021, 10/20/2021    TDAP status: Up to date  Flu Vaccine status: Due, Education has been provided regarding the importance of this vaccine. Advised may receive this vaccine at local pharmacy or Health Dept. Aware to provide a copy of the vaccination record if obtained from local pharmacy or Health Dept. Verbalized  acceptance and understanding.  Pneumococcal vaccine status: Up to date  Covid-19 vaccine status: Information provided on how to obtain vaccines.   Qualifies for Shingles Vaccine? Yes   Zostavax completed No   Shingrix Completed?: Yes  Screening Tests Health Maintenance  Topic Date Due   COVID-19 Vaccine (4 - Pfizer risk series) 07/08/2021   INFLUENZA VACCINE  02/21/2022   MAMMOGRAM  09/22/2022   TETANUS/TDAP  03/02/2030   Pneumonia Vaccine 87+ Years old  Completed   DEXA SCAN  Completed   Zoster Vaccines- Shingrix  Completed   HPV VACCINES  Aged Out    Health Maintenance  Health Maintenance Due  Topic Date Due   COVID-19 Vaccine (4 - Pfizer risk series) 07/08/2021   INFLUENZA VACCINE  02/21/2022    Colorectal cancer screening: No longer required.   Mammogram status: Completed 09/21/21. Repeat every year  Bone Density status: Completed 02/13/22. Results reflect: Bone density results: OSTEOPOROSIS. Repeat every 2 years.  Lung Cancer Screening: (Low Dose CT Chest recommended if Age 47-80 years, 30 pack-year currently smoking OR have quit w/in 15years.) does not qualify.   Lung Cancer Screening Referral: Na  Additional Screening:  Hepatitis C Screening: does qualify; Completed N/a  Vision Screening: Recommended annual ophthalmology exams for early detection of glaucoma and other disorders of the eye. Is the patient up to date with their annual eye exam?  Yes  Who is the provider or what is the name of the office in which the patient attends annual eye exams? Has new eye doctor and she doesn't know his name If pt is not established with a provider, would they like to be referred to a provider to establish care? No .   Dental Screening: Recommended annual dental exams for proper oral hygiene  Community Resource Referral / Chronic Care Management: CRR required this visit?  No   CCM required this visit?  No      Plan:     I have personally reviewed and noted the  following in the patient's chart:   Medical and social history Use of alcohol, tobacco or illicit drugs  Current medications and supplements including opioid prescriptions. Patient is not currently taking opioid prescriptions. Functional ability and status Nutritional status Physical activity Advanced directives List of other physicians Hospitalizations, surgeries, and ER visits in previous 12 months Vitals Screenings to include cognitive, depression, and falls Referrals and appointments  In addition, I have reviewed and discussed with patient certain preventive protocols, quality metrics, and best practice recommendations. A written personalized care plan for preventive services as well as general preventive health recommendations were provided to patient.     Beatris Ship, [place]   03/31/2022   Nurse Notes: None

## 2022-04-03 DIAGNOSIS — H43823 Vitreomacular adhesion, bilateral: Secondary | ICD-10-CM | POA: Diagnosis not present

## 2022-04-03 DIAGNOSIS — H35033 Hypertensive retinopathy, bilateral: Secondary | ICD-10-CM | POA: Diagnosis not present

## 2022-04-03 DIAGNOSIS — H353132 Nonexudative age-related macular degeneration, bilateral, intermediate dry stage: Secondary | ICD-10-CM | POA: Diagnosis not present

## 2022-04-03 DIAGNOSIS — H43813 Vitreous degeneration, bilateral: Secondary | ICD-10-CM | POA: Diagnosis not present

## 2022-04-19 ENCOUNTER — Telehealth: Payer: Self-pay | Admitting: Family

## 2022-04-19 NOTE — Telephone Encounter (Signed)
Patient called to speak with Nance Pear nurse but she stated she would prefer to tell Rod Holler Directly. Please call patient to discuss.

## 2022-04-20 NOTE — Telephone Encounter (Signed)
Talked to patient yesterday she was requesting  a rx for her son to be resend to pharmacy due to computer issues at the pharmacy. Rx was sent yesterday

## 2022-04-26 DIAGNOSIS — H353122 Nonexudative age-related macular degeneration, left eye, intermediate dry stage: Secondary | ICD-10-CM | POA: Diagnosis not present

## 2022-05-26 DIAGNOSIS — H353122 Nonexudative age-related macular degeneration, left eye, intermediate dry stage: Secondary | ICD-10-CM | POA: Diagnosis not present

## 2022-06-25 DIAGNOSIS — H353122 Nonexudative age-related macular degeneration, left eye, intermediate dry stage: Secondary | ICD-10-CM | POA: Diagnosis not present

## 2022-07-06 ENCOUNTER — Other Ambulatory Visit: Payer: Self-pay | Admitting: *Deleted

## 2022-07-06 DIAGNOSIS — M810 Age-related osteoporosis without current pathological fracture: Secondary | ICD-10-CM

## 2022-07-07 ENCOUNTER — Inpatient Hospital Stay: Payer: Medicare Other

## 2022-07-07 ENCOUNTER — Encounter: Payer: Self-pay | Admitting: Hematology & Oncology

## 2022-07-07 ENCOUNTER — Inpatient Hospital Stay: Payer: Medicare Other | Attending: Hematology & Oncology

## 2022-07-07 ENCOUNTER — Inpatient Hospital Stay: Payer: Medicare Other | Admitting: Hematology & Oncology

## 2022-07-07 VITALS — BP 138/73 | HR 84 | Temp 97.9°F | Resp 20 | Ht <= 58 in | Wt 126.0 lb

## 2022-07-07 VITALS — BP 120/52 | HR 66 | Resp 17

## 2022-07-07 DIAGNOSIS — M8000XA Age-related osteoporosis with current pathological fracture, unspecified site, initial encounter for fracture: Secondary | ICD-10-CM | POA: Diagnosis not present

## 2022-07-07 DIAGNOSIS — Z170 Estrogen receptor positive status [ER+]: Secondary | ICD-10-CM | POA: Insufficient documentation

## 2022-07-07 DIAGNOSIS — Z79899 Other long term (current) drug therapy: Secondary | ICD-10-CM | POA: Insufficient documentation

## 2022-07-07 DIAGNOSIS — D0512 Intraductal carcinoma in situ of left breast: Secondary | ICD-10-CM | POA: Insufficient documentation

## 2022-07-07 DIAGNOSIS — M810 Age-related osteoporosis without current pathological fracture: Secondary | ICD-10-CM

## 2022-07-07 LAB — CBC WITH DIFFERENTIAL (CANCER CENTER ONLY)
Abs Immature Granulocytes: 0.01 10*3/uL (ref 0.00–0.07)
Basophils Absolute: 0 10*3/uL (ref 0.0–0.1)
Basophils Relative: 1 %
Eosinophils Absolute: 0 10*3/uL (ref 0.0–0.5)
Eosinophils Relative: 1 %
HCT: 41 % (ref 36.0–46.0)
Hemoglobin: 13.9 g/dL (ref 12.0–15.0)
Immature Granulocytes: 0 %
Lymphocytes Relative: 23 %
Lymphs Abs: 1.1 10*3/uL (ref 0.7–4.0)
MCH: 31.4 pg (ref 26.0–34.0)
MCHC: 33.9 g/dL (ref 30.0–36.0)
MCV: 92.8 fL (ref 80.0–100.0)
Monocytes Absolute: 0.3 10*3/uL (ref 0.1–1.0)
Monocytes Relative: 7 %
Neutro Abs: 3.3 10*3/uL (ref 1.7–7.7)
Neutrophils Relative %: 68 %
Platelet Count: 187 10*3/uL (ref 150–400)
RBC: 4.42 MIL/uL (ref 3.87–5.11)
RDW: 12.3 % (ref 11.5–15.5)
WBC Count: 4.7 10*3/uL (ref 4.0–10.5)
nRBC: 0 % (ref 0.0–0.2)

## 2022-07-07 LAB — CMP (CANCER CENTER ONLY)
ALT: 7 U/L (ref 0–44)
AST: 11 U/L — ABNORMAL LOW (ref 15–41)
Albumin: 4.7 g/dL (ref 3.5–5.0)
Alkaline Phosphatase: 49 U/L (ref 38–126)
Anion gap: 10 (ref 5–15)
BUN: 15 mg/dL (ref 8–23)
CO2: 28 mmol/L (ref 22–32)
Calcium: 9.2 mg/dL (ref 8.9–10.3)
Chloride: 104 mmol/L (ref 98–111)
Creatinine: 1.06 mg/dL — ABNORMAL HIGH (ref 0.44–1.00)
GFR, Estimated: 52 mL/min — ABNORMAL LOW (ref >60)
Glucose, Bld: 115 mg/dL — ABNORMAL HIGH (ref 70–99)
Potassium: 3.7 mmol/L (ref 3.5–5.1)
Sodium: 142 mmol/L (ref 135–145)
Total Bilirubin: 0.8 mg/dL (ref 0.3–1.2)
Total Protein: 6.8 g/dL (ref 6.5–8.1)

## 2022-07-07 MED ORDER — ZOLEDRONIC ACID 4 MG/100ML IV SOLN
4.0000 mg | Freq: Once | INTRAVENOUS | Status: AC
  Administered 2022-07-07: 4 mg via INTRAVENOUS
  Filled 2022-07-07: qty 100

## 2022-07-07 MED ORDER — SODIUM CHLORIDE 0.9 % IV SOLN: Freq: Once | INTRAVENOUS | Status: AC

## 2022-07-07 NOTE — Patient Instructions (Signed)

## 2022-07-07 NOTE — Progress Notes (Signed)
Hematology and Oncology Follow Up Visit  Judith Flynn [id number] 1941-01-21 81 y.o. 07/07/2022   Principle Diagnosis:  Ductal carcinoma in situ of the left breast Osteoporosis   Past Therapy: Prolia stopped 07/08/2021 due to jaw pain and tightness with previous dose   Current Therapy:        Zometa IV Yearly   Interim History:  Judith Flynn is here today for follow-up Zometa.  She is doing quite well.  We see her yearly.  She has had no problems since we last saw her.  She has had no issues with COVID.  There is been no fever.  She has had no change in bowel or bladder habits.  She has had no cough or shortness of breath.  She has had no rashes.  There is been no bleeding.  She has had a good appetite.  She had a nice Thanksgiving.  The 1 problem is that her elderly dog is not doing well.  They have not been able to go up to [place] because of this.  It sounds like they will have a very quiet Christmas as they do not want to have too much activity for their dog.  Her last mammogram was on 09/21/2021.  Everything looked fine.  Currently, I would have to say that her performance status is probably ECOG 1.   Medications:  Allergies as of 07/07/2022       Reactions   Prolia [denosumab]    Jaw pain and tightness   Bupropion Other (See Comments)   tremors   Lisinopril Cough        Medication List        Accurate as of July 07, 2022 10:47 AM. If you have any questions, ask your nurse or doctor.          STOP taking these medications    aspirin 81 MG tablet Stopped by: Volanda Napoleon, MD   Pfizer-BioNT COVID-19 Vac-TriS Susp injection Generic drug: COVID-19 mRNA Vac-TriS Therapist, music) Stopped by: Volanda Napoleon, MD       TAKE these medications    acetaminophen 325 MG tablet Commonly known as: TYLENOL Take 650 mg by mouth every 6 (six) hours as needed.   atorvastatin 40 MG tablet Commonly known as: LIPITOR Take 1 tablet (40 mg total) by mouth daily.    Calcium 1200 1200-1000 MG-UNIT Chew Chew 1 tablet by mouth daily.   hydrochlorothiazide 25 MG tablet Commonly known as: HYDRODIURIL Take 1 tablet (25 mg total) by mouth daily.   latanoprost 0.005 % ophthalmic solution Commonly known as: XALATAN Place 1 drop into both eyes at bedtime.   omeprazole 40 MG capsule Commonly known as: PRILOSEC TAKE ONE (1) CAPSULE EACH DAY BY MOUTH   PreserVision AREDS 2 Caps Take 1 capsule by mouth daily.   Vitamin D3 75 MCG (3000 UT) Tabs Take 1 tablet by mouth daily.   zoledronic acid 5 MG/100ML Soln injection Commonly known as: RECLAST Inject 5 mg into the vein. Annual infusion        Allergies:  Allergies  Allergen Reactions   Prolia [Denosumab]     Jaw pain and tightness   Bupropion Other (See Comments)    tremors   Lisinopril Cough    Past Medical History, Surgical history, Social history, and Family History were reviewed and updated.  Review of Systems: Review of Systems  Constitutional: Negative.   HENT: Negative.    Eyes: Negative.   Respiratory: Negative.    Cardiovascular: Negative.  Gastrointestinal: Negative.   Genitourinary: Negative.   Musculoskeletal: Negative.   Skin: Negative.   Neurological: Negative.   Endo/Heme/Allergies: Negative.   Psychiatric/Behavioral: Negative.       Physical Exam:  height is '4\' 9"'$  (1.448 m) and weight is 126 lb (57.2 kg). Her oral temperature is 97.9 F (36.6 C). Her blood pressure is 138/73 and her pulse is 84. Her respiration is 20 and oxygen saturation is 98%.   Wt Readings from Last 3 Encounters:  07/07/22 126 lb (57.2 kg)  03/31/22 128 lb (58.1 kg)  01/16/22 132 lb (59.9 kg)    Physical Exam Vitals reviewed.  Constitutional:      Comments: Right breast shows no masses, edema or erythema.  There is no right axillary adenopathy.  Left breast shows well-healed lumpectomy at about the 4 o'clock position.  There is no erythema or warmth.  There is no left axillary  adenopathy.  HENT:     Head: Normocephalic and atraumatic.  Eyes:     Pupils: Pupils are equal, round, and reactive to light.  Cardiovascular:     Rate and Rhythm: Normal rate and regular rhythm.     Heart sounds: Normal heart sounds.  Pulmonary:     Effort: Pulmonary effort is normal.     Breath sounds: Normal breath sounds.  Abdominal:     General: Bowel sounds are normal.     Palpations: Abdomen is soft.  Musculoskeletal:        General: No tenderness or deformity. Normal range of motion.     Cervical back: Normal range of motion.     Comments: She does have little bit of kyphosis.  Lymphadenopathy:     Cervical: No cervical adenopathy.  Skin:    General: Skin is warm and dry.     Findings: No erythema or rash.  Neurological:     Mental Status: She is alert and oriented to person, place, and time.  Psychiatric:        Behavior: Behavior normal.        Thought Content: Thought content normal.        Judgment: Judgment normal.      Lab Results  Component Value Date   WBC 4.7 07/07/2022   HGB 13.9 07/07/2022   HCT 41.0 07/07/2022   MCV 92.8 07/07/2022   PLT 187 07/07/2022   No results found for: "FERRITIN", "IRON", "TIBC", "UIBC", "IRONPCTSAT" Lab Results  Component Value Date   RBC 4.42 07/07/2022   No results found for: "KPAFRELGTCHN", "LAMBDASER", "KAPLAMBRATIO" No results found for: "IGGSERUM", "IGA", "IGMSERUM" No results found for: "TOTALPROTELP", "ALBUMINELP", "A1GS", "A2GS", "BETS", "BETA2SER", "GAMS", "MSPIKE", "SPEI"   Chemistry      Component Value Date/Time   NA 142 07/07/2022 0956   NA 146 (H) 06/27/2017 0903   K 3.7 07/07/2022 0956   K 3.9 06/27/2017 0903   CL 104 07/07/2022 0956   CL 106 06/27/2017 0903   CO2 28 07/07/2022 0956   CO2 27 06/27/2017 0903   BUN 15 07/07/2022 0956   BUN 11 06/27/2017 0903   CREATININE 1.06 (H) 07/07/2022 0956   CREATININE 0.8 06/27/2017 0903      Component Value Date/Time   CALCIUM 9.2 07/07/2022 0956    CALCIUM 9.2 06/27/2017 0903   ALKPHOS 49 07/07/2022 0956   ALKPHOS 68 06/27/2017 0903   AST 11 (L) 07/07/2022 0956   ALT 7 07/07/2022 0956   ALT 16 06/27/2017 0903   BILITOT 0.8 07/07/2022 0956  Impression and Plan: Judith Flynn is a very pleasant 81 yo caucasian female with history of ductal carcinoma in situ of the left breast almost 21 years ago.  She continues to do quite well and so far there has been no evidence of recurrence.   We will go ahead and give her the Zometa today.  She does take vitamin D which certainly will help.  Will plan to get her back in 1 year.   Volanda Napoleon, MD 12/15/202310:47 AM

## 2022-07-18 ENCOUNTER — Ambulatory Visit: Payer: Medicare Other | Admitting: Family

## 2022-07-25 ENCOUNTER — Ambulatory Visit (INDEPENDENT_AMBULATORY_CARE_PROVIDER_SITE_OTHER): Payer: Medicare Other | Admitting: Family

## 2022-07-25 VITALS — BP 116/93 | HR 82 | Temp 97.6°F | Resp 16 | Wt 127.0 lb

## 2022-07-25 DIAGNOSIS — I10 Essential (primary) hypertension: Secondary | ICD-10-CM | POA: Diagnosis not present

## 2022-07-25 DIAGNOSIS — R7309 Other abnormal glucose: Secondary | ICD-10-CM

## 2022-07-25 DIAGNOSIS — M8000XD Age-related osteoporosis with current pathological fracture, unspecified site, subsequent encounter for fracture with routine healing: Secondary | ICD-10-CM | POA: Diagnosis not present

## 2022-07-25 DIAGNOSIS — K219 Gastro-esophageal reflux disease without esophagitis: Secondary | ICD-10-CM

## 2022-07-25 DIAGNOSIS — Z87891 Personal history of nicotine dependence: Secondary | ICD-10-CM | POA: Diagnosis not present

## 2022-07-25 DIAGNOSIS — H409 Unspecified glaucoma: Secondary | ICD-10-CM | POA: Diagnosis not present

## 2022-07-25 DIAGNOSIS — E785 Hyperlipidemia, unspecified: Secondary | ICD-10-CM | POA: Diagnosis not present

## 2022-07-25 DIAGNOSIS — H3530 Unspecified macular degeneration: Secondary | ICD-10-CM | POA: Diagnosis not present

## 2022-07-25 DIAGNOSIS — H353122 Nonexudative age-related macular degeneration, left eye, intermediate dry stage: Secondary | ICD-10-CM | POA: Diagnosis not present

## 2022-07-25 LAB — HEMOGLOBIN A1C: Hgb A1c MFr Bld: 6.1 % (ref 4.6–6.5)

## 2022-07-25 MED ORDER — HYDROCHLOROTHIAZIDE 25 MG PO TABS: 25.0000 mg | ORAL_TABLET | Freq: Every day | ORAL | 1 refills | Status: DC

## 2022-07-25 MED ORDER — OMEPRAZOLE 40 MG PO CPDR: DELAYED_RELEASE_CAPSULE | ORAL | 1 refills | Status: DC

## 2022-07-25 NOTE — Assessment & Plan Note (Signed)
Clinically stable. This is being managed by Dr. Ernst Breach.

## 2022-07-25 NOTE — Assessment & Plan Note (Signed)
Stable on xalatan and she is being followed by Dr. Antionette Fairy.

## 2022-07-25 NOTE — Assessment & Plan Note (Addendum)
BP Readings from Last 3 Encounters:  07/25/22 (!) 116/93  07/07/22 (!) 120/52  07/07/22 138/73   BP stable on hctz, continue same. Completed CMET with hematology- stable renal function. Chronic mild hyperglycemia again noted.

## 2022-07-25 NOTE — Assessment & Plan Note (Addendum)
Reports symptoms well controlled on omeprazole.  She will occasionally skip a day and then she develops symptoms.

## 2022-07-25 NOTE — Progress Notes (Signed)
Subjective:   By signing my name below, I, Judith Flynn, attest that this documentation has been prepared under the direction and in the presence of Debbrah Alar, NP. 07/25/2022   Patient ID: Judith Flynn, female    DOB: 1941-01-12, 82 y.o.   MRN: [id number]  Chief Complaint  Patient presents with   Hypertension    Here for follow up    Hypertension   Patient is in today for a follow up visit.   Refills: She is requesting a refill for 25 mg hydrochlorothiazide and 40 mg omeprazole.   Blood pressure: Her blood pressure is stable while taking 25 mg hydrochlorothiazide daily PO and she reports no new issues while taking it.  BP Readings from Last 3 Encounters:  07/25/22 (!) 116/93  07/07/22 (!) 120/52  07/07/22 138/73   Pulse Readings from Last 3 Encounters:  07/25/22 82  07/07/22 66  07/07/22 84   Cholesterol: Her last cholesterol looked stable while taking 40 mg atorvastatin daily PO. Lab Results  Component Value Date   CHOL 157 01/16/2022   HDL 59.30 01/16/2022   LDLCALC 86 01/16/2022   LDLDIRECT 100.0 02/19/2015   TRIG 59.0 01/16/2022   CHOLHDL 3 01/16/2022   Reflux: Her reflux is stable while taking 40 mg omeprazole daily PO. She takes it every morning at 10 am. She notices her reflux worsens when she forgets to take a dose.   Reclast: She had her last reclast dose a couple of weeks ago and reports no new issues since then.   Bone density: She is UTD on bone density scans.   Blood sugar: Her last kidney function test showed her blood sugars were elevated. She is interested in checking her A1c during her next blood work.   Eye doctor: She continues seeing her eye doctor regularly. She is taking latanoprost drops to manage her glaucoma in her left eye and macular degeneration in her right eye.   Immunizations: She reports receiving her flu and the latest Covid-19 vaccine in the beginning of November 2023.   Smoking: She is smoke free for the past 2  years. She used to smoke around 10-12 cigarettes daily when she was smoking regularly for the past 50 years.  She is a candidate for yearly lung CT screenings and is interested in enrolling in the program.    Past Medical History:  Diagnosis Date   Breast cancer ([place]) 2000   Left Breast Cancer   Cancer ([place]) 2000   breast- left breast- s/p lumpectomy and radiation   Colon polyp    Hearing loss    Hyperlipidemia    Hypertension    Osteoporosis    Personal history of radiation therapy 2000   Left Breast Cancer   Tobacco abuse     Past Surgical History:  Procedure Laterality Date   BREAST BIOPSY Left 2000   BREAST LUMPECTOMY Left 2000   CATARACT EXTRACTION, BILATERAL Bilateral July and August 2015   CHOLECYSTECTOMY     surgery on ear drum     right    Family History  Problem Relation Age of Onset   Alcohol abuse Mother    Arthritis Mother    Hypertension Mother 64   Heart disease Mother        CHF   Alcohol abuse Father    Arthritis Father    Hypertension Father 78   Heart attack Father        Died of MI at age 35  Social History   Socioeconomic History   Marital status: Widowed    Spouse name: Not on file   Number of children: 3   Years of education: Not on file   Highest education level: Not on file  Occupational History   Occupation: retired    Fish farm manager: RETIRED  Tobacco Use   Smoking status: Former    Packs/day: 0.50    Years: 50.00    Total pack years: 25.00    Types: Cigarettes    Quit date: 2021    Years since quitting: 3.0   Smokeless tobacco: Never   Tobacco comments:    patient said she quit 10/2019  Vaping Use   Vaping Use: Never used  Substance and Sexual Activity   Alcohol use: Yes    Alcohol/week: 0.0 standard drinks of alcohol    Comment: Rare   Drug use: No   Sexual activity: Not Currently  Other Topics Concern   Not on file  Social History Narrative   Widow/ widower   Regular exercise- yes   Originally from [place] Strain: Low Risk  (03/31/2022)   Overall Financial Resource Strain (CARDIA)    Difficulty of Paying Living Expenses: Not hard at all  Food Insecurity: No Food Insecurity (03/31/2022)   Hunger Vital Sign    Worried About Running Out of Food in the Last Year: Never true    Ran Out of Food in the Last Year: Never true  Transportation Needs: No Transportation Needs (03/31/2022)   PRAPARE - Hydrologist (Medical): No    Lack of Transportation (Non-Medical): No  Physical Activity: Inactive (03/31/2022)   Exercise Vital Sign    Days of Exercise per Week: 0 days    Minutes of Exercise per Session: 0 min  Stress: No Stress Concern Present (03/31/2022)   [place]    Feeling of Stress : Not at all  Social Connections: Socially Isolated (03/31/2022)   Social Connection and Isolation Panel [NHANES]    Frequency of Communication with Friends and Family: Three times a week    Frequency of Social Gatherings with Friends and Family: More than three times a week    Attends Religious Services: Never    Marine scientist or Organizations: No    Attends Archivist Meetings: Never    Marital Status: Widowed  Intimate Partner Violence: Not At Risk (03/31/2022)   Humiliation, Afraid, Rape, and Kick questionnaire    Fear of Current or Ex-Partner: No    Emotionally Abused: No    Physically Abused: No    Sexually Abused: No    Outpatient Medications Prior to Visit  Medication Sig Dispense Refill   acetaminophen (TYLENOL) 325 MG tablet Take 650 mg by mouth every 6 (six) hours as needed.     atorvastatin (LIPITOR) 40 MG tablet Take 1 tablet (40 mg total) by mouth daily. 90 tablet 3   Calcium Carbonate-Vit D-Min (CALCIUM 1200) 1200-1000 MG-UNIT CHEW Chew 1 tablet by mouth daily.     Cholecalciferol (VITAMIN D3) 3000 units TABS Take 1 tablet by mouth daily.  30 tablet    latanoprost (XALATAN) 0.005 % ophthalmic solution Place 1 drop into both eyes at bedtime.     Multiple Vitamins-Minerals (PRESERVISION AREDS 2) CAPS Take 1 capsule by mouth daily.     zoledronic acid (RECLAST) 5 MG/100ML  SOLN injection Inject 5 mg into the vein. Annual infusion     hydrochlorothiazide (HYDRODIURIL) 25 MG tablet Take 1 tablet (25 mg total) by mouth daily. 90 tablet 1   omeprazole (PRILOSEC) 40 MG capsule TAKE ONE (1) CAPSULE EACH DAY BY MOUTH 90 capsule 1   No facility-administered medications prior to visit.    Allergies  Allergen Reactions   Prolia [Denosumab]     Jaw pain and tightness   Bupropion Other (See Comments)    tremors   Lisinopril Cough    ROS    See HPI Objective:    Physical Exam Constitutional:      General: She is not in acute distress.    Appearance: Normal appearance. She is not ill-appearing.  HENT:     Head: Normocephalic and atraumatic.     Right Ear: External ear normal.     Left Ear: External ear normal.  Eyes:     Extraocular Movements: Extraocular movements intact.     Pupils: Pupils are equal, round, and reactive to light.  Cardiovascular:     Rate and Rhythm: Normal rate and regular rhythm.     Heart sounds: Normal heart sounds. No murmur heard.    No gallop.  Pulmonary:     Effort: Pulmonary effort is normal. No respiratory distress.     Breath sounds: Normal breath sounds. No wheezing or rales.  Skin:    General: Skin is warm and dry.  Neurological:     Mental Status: She is alert and oriented to person, place, and time.  Psychiatric:        Judgment: Judgment normal.     BP (!) 116/93 (BP Location: Right Arm, Patient Position: Sitting, Cuff Size: Small)   Pulse 82   Temp 97.6 F (36.4 C) (Oral)   Resp 16   Wt 127 lb (57.6 kg)   SpO2 96%   BMI 27.48 kg/m  Wt Readings from Last 3 Encounters:  07/25/22 127 lb (57.6 kg)  07/07/22 126 lb (57.2 kg)  03/31/22 128 lb (58.1 kg)       Assessment &  Plan:  Essential hypertension Assessment & Plan: BP Readings from Last 3 Encounters:  07/25/22 (!) 116/93  07/07/22 (!) 120/52  07/07/22 138/73   BP stable on hctz, continue same. Completed CMET with hematology- stable renal function. Chronic mild hyperglycemia again noted.    Gastroesophageal reflux disease without esophagitis Assessment & Plan: Reports symptoms well controlled on omeprazole.  She will occasionally skip a day and then she develops symptoms.    Hyperlipidemia, unspecified hyperlipidemia type Assessment & Plan: Lab Results  Component Value Date   CHOL 157 01/16/2022   HDL 59.30 01/16/2022   LDLCALC 86 01/16/2022   LDLDIRECT 100.0 02/19/2015   TRIG 59.0 01/16/2022   CHOLHDL 3 01/16/2022   LDL at goal on atorvastatin '40mg'$ . Continue same.    Osteoporosis with current pathological fracture with routine healing, unspecified osteoporosis type, subsequent encounter Assessment & Plan: Received reclast on 07/07/22. Bone density up to date.    Glaucoma, unspecified glaucoma type, unspecified laterality Assessment & Plan: Stable on xalatan and she is being followed by Dr. Antionette Fairy.    Macular degeneration, unspecified laterality, unspecified type Assessment & Plan: Clinically stable. This is being managed by Dr. Ernst Breach.    History of tobacco abuse -     CT CHEST LUNG CANCER SCREENING LOW DOSE WO CONTRAST; Future  Elevated glucose level -     Hemoglobin A1c  Other orders -  Omeprazole; TAKE ONE (1) CAPSULE EACH DAY BY MOUTH  Dispense: 90 capsule; Refill: 1 -     hydroCHLOROthiazide; Take 1 tablet (25 mg total) by mouth daily.  Dispense: 90 tablet; Refill: 1    I, Judith Pear, NP, personally preformed the services described in this documentation.  All medical record entries made by the scribe were at my direction and in my presence.  I have reviewed the chart and discharge instructions (if applicable) and agree that the record  reflects my personal performance and is accurate and complete. 07/25/2022   I,Judith Flynn,acting as a Education administrator for Judith Pear, NP.,have documented all relevant documentation on the behalf of Judith Pear, NP,as directed by  Judith Pear, NP while in the presence of Judith Pear, NP.  Judith Pear, NP

## 2022-07-25 NOTE — Assessment & Plan Note (Signed)
Lab Results  Component Value Date   CHOL 157 01/16/2022   HDL 59.30 01/16/2022   LDLCALC 86 01/16/2022   LDLDIRECT 100.0 02/19/2015   TRIG 59.0 01/16/2022   CHOLHDL 3 01/16/2022   LDL at goal on atorvastatin '40mg'$ . Continue same.

## 2022-07-25 NOTE — Assessment & Plan Note (Addendum)
Received reclast on 07/07/22. Bone density up to date.

## 2022-07-26 ENCOUNTER — Encounter: Payer: Self-pay | Admitting: Family

## 2022-07-26 DIAGNOSIS — R739 Hyperglycemia, unspecified: Secondary | ICD-10-CM | POA: Insufficient documentation

## 2022-07-27 DIAGNOSIS — H401134 Primary open-angle glaucoma, bilateral, indeterminate stage: Secondary | ICD-10-CM | POA: Diagnosis not present

## 2022-07-27 DIAGNOSIS — H35363 Drusen (degenerative) of macula, bilateral: Secondary | ICD-10-CM | POA: Diagnosis not present

## 2022-07-27 DIAGNOSIS — H524 Presbyopia: Secondary | ICD-10-CM | POA: Diagnosis not present

## 2022-07-27 DIAGNOSIS — H52203 Unspecified astigmatism, bilateral: Secondary | ICD-10-CM | POA: Diagnosis not present

## 2022-07-27 DIAGNOSIS — Z961 Presence of intraocular lens: Secondary | ICD-10-CM | POA: Diagnosis not present

## 2022-07-27 DIAGNOSIS — H353231 Exudative age-related macular degeneration, bilateral, with active choroidal neovascularization: Secondary | ICD-10-CM | POA: Diagnosis not present

## 2022-07-27 DIAGNOSIS — H5213 Myopia, bilateral: Secondary | ICD-10-CM | POA: Diagnosis not present

## 2022-08-07 DIAGNOSIS — H35033 Hypertensive retinopathy, bilateral: Secondary | ICD-10-CM | POA: Diagnosis not present

## 2022-08-07 DIAGNOSIS — H43813 Vitreous degeneration, bilateral: Secondary | ICD-10-CM | POA: Diagnosis not present

## 2022-08-07 DIAGNOSIS — H35423 Microcystoid degeneration of retina, bilateral: Secondary | ICD-10-CM | POA: Diagnosis not present

## 2022-08-07 DIAGNOSIS — H353132 Nonexudative age-related macular degeneration, bilateral, intermediate dry stage: Secondary | ICD-10-CM | POA: Diagnosis not present

## 2022-08-07 DIAGNOSIS — H43823 Vitreomacular adhesion, bilateral: Secondary | ICD-10-CM | POA: Diagnosis not present

## 2022-08-16 ENCOUNTER — Other Ambulatory Visit (HOSPITAL_BASED_OUTPATIENT_CLINIC_OR_DEPARTMENT_OTHER): Payer: Self-pay | Admitting: Family

## 2022-08-16 DIAGNOSIS — Z1231 Encounter for screening mammogram for malignant neoplasm of breast: Secondary | ICD-10-CM

## 2022-09-23 DIAGNOSIS — H353122 Nonexudative age-related macular degeneration, left eye, intermediate dry stage: Secondary | ICD-10-CM | POA: Diagnosis not present

## 2022-09-26 DIAGNOSIS — H906 Mixed conductive and sensorineural hearing loss, bilateral: Secondary | ICD-10-CM | POA: Diagnosis not present

## 2022-10-16 ENCOUNTER — Ambulatory Visit (HOSPITAL_BASED_OUTPATIENT_CLINIC_OR_DEPARTMENT_OTHER)
Admission: RE | Admit: 2022-10-16 | Discharge: 2022-10-16 | Disposition: A | Discharge status: Home / Self Care | Payer: Medicare Other | Source: Ambulatory Visit | Attending: Family | Admitting: Family

## 2022-10-16 ENCOUNTER — Encounter (HOSPITAL_BASED_OUTPATIENT_CLINIC_OR_DEPARTMENT_OTHER): Payer: Self-pay

## 2022-10-16 DIAGNOSIS — Z87891 Personal history of nicotine dependence: Secondary | ICD-10-CM

## 2022-10-16 DIAGNOSIS — Z1231 Encounter for screening mammogram for malignant neoplasm of breast: Secondary | ICD-10-CM | POA: Insufficient documentation

## 2022-10-23 DIAGNOSIS — H353122 Nonexudative age-related macular degeneration, left eye, intermediate dry stage: Secondary | ICD-10-CM | POA: Diagnosis not present

## 2022-10-25 ENCOUNTER — Ambulatory Visit (HOSPITAL_BASED_OUTPATIENT_CLINIC_OR_DEPARTMENT_OTHER): Payer: Medicare Other

## 2022-11-09 DIAGNOSIS — H35033 Hypertensive retinopathy, bilateral: Secondary | ICD-10-CM | POA: Diagnosis not present

## 2022-11-09 DIAGNOSIS — H353132 Nonexudative age-related macular degeneration, bilateral, intermediate dry stage: Secondary | ICD-10-CM | POA: Diagnosis not present

## 2022-11-09 DIAGNOSIS — H43823 Vitreomacular adhesion, bilateral: Secondary | ICD-10-CM | POA: Diagnosis not present

## 2022-11-09 DIAGNOSIS — H43813 Vitreous degeneration, bilateral: Secondary | ICD-10-CM | POA: Diagnosis not present

## 2022-11-09 DIAGNOSIS — H401134 Primary open-angle glaucoma, bilateral, indeterminate stage: Secondary | ICD-10-CM | POA: Diagnosis not present

## 2022-11-09 DIAGNOSIS — H35423 Microcystoid degeneration of retina, bilateral: Secondary | ICD-10-CM | POA: Diagnosis not present

## 2022-11-22 DIAGNOSIS — H353122 Nonexudative age-related macular degeneration, left eye, intermediate dry stage: Secondary | ICD-10-CM | POA: Diagnosis not present

## 2022-12-22 DIAGNOSIS — H353122 Nonexudative age-related macular degeneration, left eye, intermediate dry stage: Secondary | ICD-10-CM | POA: Diagnosis not present

## 2023-01-21 DIAGNOSIS — H353122 Nonexudative age-related macular degeneration, left eye, intermediate dry stage: Secondary | ICD-10-CM | POA: Diagnosis not present

## 2023-01-30 ENCOUNTER — Other Ambulatory Visit: Payer: Self-pay | Admitting: Family

## 2023-02-10 ENCOUNTER — Other Ambulatory Visit: Payer: Self-pay | Admitting: Family

## 2023-02-16 DIAGNOSIS — H401112 Primary open-angle glaucoma, right eye, moderate stage: Secondary | ICD-10-CM | POA: Diagnosis not present

## 2023-02-16 DIAGNOSIS — H401134 Primary open-angle glaucoma, bilateral, indeterminate stage: Secondary | ICD-10-CM | POA: Diagnosis not present

## 2023-02-16 DIAGNOSIS — H401121 Primary open-angle glaucoma, left eye, mild stage: Secondary | ICD-10-CM | POA: Diagnosis not present

## 2023-02-16 DIAGNOSIS — Z961 Presence of intraocular lens: Secondary | ICD-10-CM | POA: Diagnosis not present

## 2023-02-16 DIAGNOSIS — H353232 Exudative age-related macular degeneration, bilateral, with inactive choroidal neovascularization: Secondary | ICD-10-CM | POA: Diagnosis not present

## 2023-02-20 DIAGNOSIS — H353122 Nonexudative age-related macular degeneration, left eye, intermediate dry stage: Secondary | ICD-10-CM | POA: Diagnosis not present

## 2023-02-28 DIAGNOSIS — H401134 Primary open-angle glaucoma, bilateral, indeterminate stage: Secondary | ICD-10-CM | POA: Diagnosis not present

## 2023-02-28 DIAGNOSIS — H35423 Microcystoid degeneration of retina, bilateral: Secondary | ICD-10-CM | POA: Diagnosis not present

## 2023-02-28 DIAGNOSIS — H43813 Vitreous degeneration, bilateral: Secondary | ICD-10-CM | POA: Diagnosis not present

## 2023-02-28 DIAGNOSIS — H353114 Nonexudative age-related macular degeneration, right eye, advanced atrophic with subfoveal involvement: Secondary | ICD-10-CM | POA: Diagnosis not present

## 2023-02-28 DIAGNOSIS — H353122 Nonexudative age-related macular degeneration, left eye, intermediate dry stage: Secondary | ICD-10-CM | POA: Diagnosis not present

## 2023-02-28 DIAGNOSIS — H35033 Hypertensive retinopathy, bilateral: Secondary | ICD-10-CM | POA: Diagnosis not present

## 2023-02-28 DIAGNOSIS — H43823 Vitreomacular adhesion, bilateral: Secondary | ICD-10-CM | POA: Diagnosis not present

## 2023-03-26 ENCOUNTER — Other Ambulatory Visit: Payer: Self-pay | Admitting: Family

## 2023-05-07 ENCOUNTER — Encounter: Payer: Self-pay | Admitting: Family Medicine

## 2023-05-07 ENCOUNTER — Ambulatory Visit (INDEPENDENT_AMBULATORY_CARE_PROVIDER_SITE_OTHER): Payer: Medicare Other | Admitting: Family Medicine

## 2023-05-07 VITALS — BP 122/66 | HR 99 | Temp 98.1°F | Ht <= 58 in | Wt 115.0 lb

## 2023-05-07 DIAGNOSIS — U071 COVID-19: Secondary | ICD-10-CM | POA: Diagnosis not present

## 2023-05-07 MED ORDER — FLUTICASONE PROPIONATE 50 MCG/ACT NA SUSP: 2.0000 | Freq: Every day | NASAL | 6 refills | Status: DC

## 2023-05-07 MED ORDER — BENZONATATE 100 MG PO CAPS: 100.0000 mg | ORAL_CAPSULE | Freq: Two times a day (BID) | ORAL | 0 refills | Status: DC | PRN

## 2023-05-07 MED ORDER — GUAIFENESIN ER 600 MG PO TB12: 1200.0000 mg | ORAL_TABLET | Freq: Two times a day (BID) | ORAL | 0 refills | Status: DC

## 2023-05-07 MED ORDER — LIDOCAINE VISCOUS HCL 2 % MT SOLN: 15.0000 mL | OROMUCOSAL | 0 refills | Status: DC | PRN

## 2023-05-07 NOTE — Progress Notes (Signed)
Acute Office Visit  Subjective:     Patient ID: Judith Flynn, female    DOB: [birth year], 82 y.o.   MRN: [id number]  Chief Complaint  Patient presents with   Covid Positive    HPI Patient is in today for positive home COVID test.   Discussed the use of AI scribe software for clinical note transcription with the patient, who gave verbal consent to proceed.  History of Present Illness   The patient, with a history of hearing impairment, presented with symptoms suggestive of COVID-19. She reported feeling unwell for the past two days, with the initial symptom being body aches, followed by chills. The patient denied experiencing fever or feeling feverish, but noted a significant sore throat, rating the pain as close to 10 on a scale of 0 to 10. She also reported a slight cough, with occasional expectoration, but did not inspect the sputum. The patient denied any gastrointestinal symptoms but noted a slight headache that came and went. She also reported significant fatigue and a feeling of being run down.  The patient's worst symptom at the time of the consultation was the sore throat. She reported a lot of swallowing to clear stuff, which was painful. Despite the pain, she was trying to drink plenty of fluids. The patient also reported a runny nose. She had taken over-the-counter Aleve and Alka Seltzer Plus, which helped with the body aches and made her feel a little better.  The patient had a positive home test for COVID-19 the morning of the consultation. This was her first known episode of COVID-19.   The patient was concerned about a worsening sore throat, which had kept her up the previous night. The patient was urinating normally and denied any signs of dehydration such as dark urine or dizziness.            ROS All review of systems negative except what is listed in the HPI      Objective:    BP 122/66   Pulse 99   Temp 98.1 F (36.7 C) (Oral)   Ht 4\' 9"  (1.448 m)    Wt 115 lb (52.2 kg)   SpO2 94%   BMI 24.89 kg/m    Physical Exam Vitals reviewed.  Constitutional:      Appearance: Normal appearance.  HENT:     Head: Normocephalic and atraumatic.     Right Ear: Tympanic membrane normal.     Left Ear: Tympanic membrane normal.     Mouth/Throat:     Pharynx: Posterior oropharyngeal erythema present.     Comments: Cobblestoning/PND Eyes:     Conjunctiva/sclera: Conjunctivae normal.  Cardiovascular:     Rate and Rhythm: Normal rate and regular rhythm.     Heart sounds: Normal heart sounds.  Pulmonary:     Effort: Pulmonary effort is normal. No respiratory distress.     Breath sounds: Normal breath sounds. No wheezing, rhonchi or rales.     Comments: Occasional cough Musculoskeletal:     Cervical back: Normal range of motion and neck supple.  Lymphadenopathy:     Cervical: No cervical adenopathy.  Skin:    General: Skin is warm and dry.  Neurological:     Mental Status: She is alert and oriented to person, place, and time.  Psychiatric:        Mood and Affect: Mood normal.        Behavior: Behavior normal.        Thought Content: Thought content normal.  Judgment: Judgment normal.       No results found for any visits on 05/07/23.      Assessment & Plan:   Problem List Items Addressed This Visit   None Visit Diagnoses     COVID-19    -  Primary   Relevant Medications   lidocaine (XYLOCAINE) 2 % solution   guaiFENesin (MUCINEX) 600 MG 12 hr tablet   fluticasone (FLONASE) 50 MCG/ACT nasal spray   benzonatate (TESSALON) 100 MG capsule     Adding lidocaine for sore throat Start Mucinex twice daily and Flonase daily Giving you some Tessalon pearls for cough Continue supportive measures including rest, hydration, humidifier use, steam showers, warm compresses to sinuses, warm liquids with lemon and honey, and over-the-counter cough, cold, and analgesics as needed.  Please contact office for follow-up if symptoms do  not improve or worsen. Seek emergency care if symptoms become severe.   Meds ordered this encounter  Medications   lidocaine (XYLOCAINE) 2 % solution    Sig: Use as directed 15 mLs in the mouth or throat every 4 (four) hours as needed for mouth pain.    Dispense:  100 mL    Refill:  0    Order Specific Question:   Supervising Provider    Answer:   Danise Edge A [[id number]]   guaiFENesin (MUCINEX) 600 MG 12 hr tablet    Sig: Take 2 tablets (1,200 mg total) by mouth 2 (two) times daily.    Dispense:  30 tablet    Refill:  0    Order Specific Question:   Supervising Provider    Answer:   Danise Edge A [[id number]]   fluticasone (FLONASE) 50 MCG/ACT nasal spray    Sig: Place 2 sprays into both nostrils daily.    Dispense:  16 g    Refill:  6    Order Specific Question:   Supervising Provider    Answer:   Danise Edge A [[id number]]   benzonatate (TESSALON) 100 MG capsule    Sig: Take 1 capsule (100 mg total) by mouth 2 (two) times daily as needed for cough.    Dispense:  20 capsule    Refill:  0    Order Specific Question:   Supervising Provider    Answer:   Danise Edge A [[id number]]    Return if symptoms worsen or fail to improve.  Clayborne Dana, NP

## 2023-05-07 NOTE — Patient Instructions (Signed)
Adding lidocaine for sore throat Start Mucinex twice daily and Flonase daily Giving you some Tessalon pearls for cough Continue supportive measures including rest, hydration, humidifier use, steam showers, warm compresses to sinuses, warm liquids with lemon and honey, and over-the-counter cough, cold, and analgesics as needed.   Please contact office for follow-up if symptoms do not improve or worsen. Seek emergency care if symptoms become severe.

## 2023-05-08 ENCOUNTER — Ambulatory Visit: Payer: Medicare Other | Admitting: Physician Assistant

## 2023-05-10 ENCOUNTER — Other Ambulatory Visit: Payer: Self-pay | Admitting: Family Medicine

## 2023-05-10 DIAGNOSIS — U071 COVID-19: Secondary | ICD-10-CM

## 2023-06-06 ENCOUNTER — Ambulatory Visit (INDEPENDENT_AMBULATORY_CARE_PROVIDER_SITE_OTHER): Payer: Medicare Other | Admitting: Family

## 2023-06-06 VITALS — BP 130/65 | HR 59 | Temp 97.1°F | Resp 16 | Ht <= 58 in | Wt 111.0 lb

## 2023-06-06 DIAGNOSIS — F32A Depression, unspecified: Secondary | ICD-10-CM

## 2023-06-06 DIAGNOSIS — I10 Essential (primary) hypertension: Secondary | ICD-10-CM

## 2023-06-06 DIAGNOSIS — F419 Anxiety disorder, unspecified: Secondary | ICD-10-CM | POA: Diagnosis not present

## 2023-06-06 DIAGNOSIS — R591 Generalized enlarged lymph nodes: Secondary | ICD-10-CM

## 2023-06-06 DIAGNOSIS — R634 Abnormal weight loss: Secondary | ICD-10-CM

## 2023-06-06 LAB — COMPREHENSIVE METABOLIC PANEL
ALT: 5 U/L (ref 0–35)
AST: 9 U/L (ref 0–37)
Albumin: 4.2 g/dL (ref 3.5–5.2)
Alkaline Phosphatase: 53 U/L (ref 39–117)
BUN: 17 mg/dL (ref 6–23)
CO2: 29 meq/L (ref 19–32)
Calcium: 9.5 mg/dL (ref 8.4–10.5)
Chloride: 104 meq/L (ref 96–112)
Creatinine, Ser: 0.98 mg/dL (ref 0.40–1.20)
GFR: 53.24 mL/min — ABNORMAL LOW (ref >60.00)
Glucose, Bld: 104 mg/dL — ABNORMAL HIGH (ref 70–99)
Potassium: 4.2 meq/L (ref 3.5–5.1)
Sodium: 141 meq/L (ref 135–145)
Total Bilirubin: 0.6 mg/dL (ref 0.2–1.2)
Total Protein: 6.6 g/dL (ref 6.0–8.3)

## 2023-06-06 MED ORDER — ESCITALOPRAM OXALATE 5 MG PO TABS: 5.0000 mg | ORAL_TABLET | Freq: Every day | ORAL | 2 refills | Status: DC

## 2023-06-06 NOTE — Progress Notes (Signed)
Subjective:     Patient ID: Judith Flynn, female    DOB: Aug 26, 1940, 82 y.o.   MRN: [id number]  Chief Complaint  Patient presents with   Weight Loss    Patient reports weight loss   Anxiety    Patient complains of feeling anxious/ stressed  about the future    HPI  Discussed the use of AI scribe software for clinical note transcription with the patient, who gave verbal consent to proceed.  History of Present Illness   The patient presents with significant stress and weight loss. She is primarily concerned about her son, Chrissie Noa, who has been dealing with an unspecified health issue since March. The patient is hopeful about the potential for medication to help Chrissie Noa, but is anxious about the possibility of a transplant. She expresses that Chrissie Noa has been through a lot since birth, but maintains a positive attitude.  The patient also discusses family dynamics, particularly with her daughters, Bonita Quin and Paul Dykes. Bonita Quin lives with the patient and there seems to be tension and misunderstanding between the sisters. The patient feels that Bonita Quin is resentful of the attention Chrissie Noa receives and does not understand Doreen's emotions. The patient also feels that Bonita Quin does not appreciate the things she does for her, which adds to her stress.  The patient has noticed a decrease in her appetite and has lost weight. She also noticed a lump on her neck in July, which she initially attributed to carrying a heavy purse. The patient reports that her stress does not affect her sleep or her ability to do things like grocery shopping. However, she does feel more irritable and gets upset more quickly than she used to.      Wt Readings from Last 3 Encounters:  06/06/23 111 lb (50.3 kg)  05/07/23 115 lb (52.2 kg)  07/25/22 127 lb (57.6 kg)       Health Maintenance Due  Topic Date Due   Medicare Annual Wellness (AWV)  04/01/2023    Past Medical History:  Diagnosis Date   Breast cancer (HCC)  2000   Left Breast Cancer   Cancer (HCC) 2000   breast- left breast- s/p lumpectomy and radiation   Colon polyp    Hearing loss    Hyperglycemia    Hyperlipidemia    Hypertension    Osteoporosis    Personal history of radiation therapy 2000   Left Breast Cancer   Tobacco abuse     Past Surgical History:  Procedure Laterality Date   BREAST BIOPSY Left 2000   BREAST LUMPECTOMY Left 2000   CATARACT EXTRACTION, BILATERAL Bilateral July and August 2015   CHOLECYSTECTOMY     surgery on ear drum     right    Family History  Problem Relation Age of Onset   Alcohol abuse Mother    Arthritis Mother    Hypertension Mother 79   Heart disease Mother        CHF   Alcohol abuse Father    Arthritis Father    Hypertension Father 77   Heart attack Father        Died of MI at age 41    Social History   Socioeconomic History   Marital status: Widowed    Spouse name: Not on file   Number of children: 3   Years of education: Not on file   Highest education level: GED or equivalent  Occupational History   Occupation: retired    Associate Professor: RETIRED  Tobacco Use  Smoking status: Former    Current packs/day: 0.00    Average packs/day: 0.5 packs/day for 50.0 years (25.0 ttl pk-yrs)    Types: Cigarettes    Start date: 36    Quit date: 2021    Years since quitting: 3.8   Smokeless tobacco: Never   Tobacco comments:    patient said she quit 10/2019  Vaping Use   Vaping status: Never Used  Substance and Sexual Activity   Alcohol use: Yes    Alcohol/week: 0.0 standard drinks of alcohol    Comment: Rare   Drug use: No   Sexual activity: Not Currently  Other Topics Concern   Not on file  Social History Narrative   Widow/ widower   Regular exercise- yes   Originally from [place]         Social Determinants of Health   Financial Resource Strain: Low Risk  (06/05/2023)   Overall Financial Resource Strain (CARDIA)    Difficulty of Paying Living Expenses: Not hard at all   Food Insecurity: No Food Insecurity (06/05/2023)   Hunger Vital Sign    Worried About Running Out of Food in the Last Year: Never true    Ran Out of Food in the Last Year: Never true  Transportation Needs: No Transportation Needs (06/05/2023)   PRAPARE - Administrator, Civil Service (Medical): No    Lack of Transportation (Non-Medical): No  Physical Activity: Unknown (06/05/2023)   Exercise Vital Sign    Days of Exercise per Week: 2 days    Minutes of Exercise per Session: Patient declined  Stress: Stress Concern Present (06/05/2023)   [employer] of Occupational Health - Occupational Stress Questionnaire    Feeling of Stress : To some extent  Social Connections: Unknown (06/05/2023)   Social Connection and Isolation Panel [NHANES]    Frequency of Communication with Friends and Family: More than three times a week    Frequency of Social Gatherings with Friends and Family: Patient declined    Attends Religious Services: Patient declined    Database administrator or Organizations: No    Attends Engineer, structural: Not on file    Marital Status: Widowed  Intimate Partner Violence: Not At Risk (03/31/2022)   Humiliation, Afraid, Rape, and Kick questionnaire    Fear of Current or Ex-Partner: No    Emotionally Abused: No    Physically Abused: No    Sexually Abused: No    Outpatient Medications Prior to Visit  Medication Sig Dispense Refill   acetaminophen (TYLENOL) 325 MG tablet Take 650 mg by mouth every 6 (six) hours as needed.     atorvastatin (LIPITOR) 40 MG tablet TAKE ONE (1) TABLET BY MOUTH EVERY DAY 90 tablet 3   Calcium Carbonate-Vit D-Min (CALCIUM 1200) 1200-1000 MG-UNIT CHEW Chew 1 tablet by mouth daily.     Cholecalciferol (VITAMIN D3) 3000 units TABS Take 1 tablet by mouth daily. 30 tablet    fluticasone (FLONASE) 50 MCG/ACT nasal spray Place 2 sprays into both nostrils daily. 16 g 6   hydrochlorothiazide (HYDRODIURIL) 25 MG tablet TAKE ONE  (1) TABLET BY MOUTH EVERY DAY 90 tablet 1   latanoprost (XALATAN) 0.005 % ophthalmic solution Place 1 drop into both eyes at bedtime.     lidocaine (XYLOCAINE) 2 % solution Use as directed 15 mLs in the mouth or throat every 4 (four) hours as needed for mouth pain. 100 mL 0   Multiple Vitamins-Minerals (PRESERVISION AREDS 2) CAPS Take 1  capsule by mouth daily.     omeprazole (PRILOSEC) 40 MG capsule TAKE ONE CAPSULE BY MOUTH DAILY 90 capsule 1   zoledronic acid (RECLAST) 5 MG/100ML SOLN injection Inject 5 mg into the vein. Annual infusion     benzonatate (TESSALON) 100 MG capsule Take 1 capsule (100 mg total) by mouth 2 (two) times daily as needed for cough. 20 capsule 0   guaiFENesin (MUCINEX) 600 MG 12 hr tablet Take 2 tablets (1,200 mg total) by mouth 2 (two) times daily. 30 tablet 0   No facility-administered medications prior to visit.    Allergies  Allergen Reactions   Prolia [Denosumab]     Jaw pain and tightness   Bupropion Other (See Comments)    tremors   Lisinopril Cough    ROS    See HPI Objective:    Physical Exam Constitutional:      General: She is not in acute distress.    Appearance: Normal appearance. She is well-developed.  HENT:     Head: Normocephalic and atraumatic.     Right Ear: External ear normal.     Left Ear: External ear normal.  Eyes:     General: No scleral icterus. Neck:     Thyroid: No thyromegaly.  Cardiovascular:     Rate and Rhythm: Normal rate and regular rhythm.     Heart sounds: Normal heart sounds. No murmur heard. Pulmonary:     Effort: Pulmonary effort is normal. No respiratory distress.     Breath sounds: Normal breath sounds. No wheezing.  Musculoskeletal:     Cervical back: Neck supple.  Lymphadenopathy:     Cervical: No cervical adenopathy.     Upper Body:     Left upper body: No supraclavicular adenopathy.     Comments: Right supraclavicular lymphadenopathy  Skin:    General: Skin is warm and dry.  Neurological:      Mental Status: She is alert and oriented to person, place, and time.  Psychiatric:        Mood and Affect: Mood normal.        Behavior: Behavior normal.        Thought Content: Thought content normal.        Judgment: Judgment normal.      BP 130/65   Pulse (!) 59   Temp (!) 97.1 F (36.2 C) (Oral)   Resp 16   Ht 4\' 9"  (1.448 m)   Wt 111 lb (50.3 kg)   SpO2 98%   BMI 24.02 kg/m  Wt Readings from Last 3 Encounters:  06/06/23 111 lb (50.3 kg)  05/07/23 115 lb (52.2 kg)  07/25/22 127 lb (57.6 kg)       Assessment & Flynn:   Problem List Items Addressed This Visit       Unprioritized   Lymphadenopathy    I am concerned about the possibility of underlying malignancy.  Recommend labs as ordered and CT chest/abdomen and pelvis for further evaluation.       Relevant Orders   CT CHEST W CONTRAST   CT ABDOMEN PELVIS W CONTRAST   Essential hypertension    BP Readings from Last 3 Encounters:  06/06/23 130/65  05/07/23 122/66  07/25/22 (!) 116/93   Continue hydrochlorothiazide.       Relevant Orders   Comp Met (CMET)   Anxiety and depression - Primary    Uncontrolled. Reports of increased irritability, stress, and feeling overwhelmed due to family dynamics and concerns for son's health. -Start  Lexapro 5mg  daily. -Consider counseling services for additional emotional support.  Follow-up in 1 month to assess response to Lexapro and any new developments.      Relevant Medications   escitalopram (LEXAPRO) 5 MG tablet   Other Visit Diagnoses     Unexplained weight loss       Relevant Orders   CT CHEST W CONTRAST   CT ABDOMEN PELVIS W CONTRAST       I have discontinued Liam Rogers. Storck's guaiFENesin and benzonatate. I am also having her start on escitalopram. Additionally, I am having her maintain her Calcium 1200, zoledronic acid, acetaminophen, PreserVision AREDS 2, Vitamin D3, latanoprost, hydrochlorothiazide, atorvastatin, omeprazole, lidocaine, and  fluticasone.  Meds ordered this encounter  Medications   escitalopram (LEXAPRO) 5 MG tablet    Sig: Take 1 tablet (5 mg total) by mouth daily.    Dispense:  30 tablet    Refill:  2    Order Specific Question:   Supervising Provider    Answer:   Danise Edge A [id number]

## 2023-06-06 NOTE — Assessment & Plan Note (Addendum)
I am concerned about the possibility of underlying malignancy.  Recommend labs as ordered and CT chest/abdomen and pelvis for further evaluation.

## 2023-06-06 NOTE — Assessment & Plan Note (Signed)
Uncontrolled. Reports of increased irritability, stress, and feeling overwhelmed due to family dynamics and concerns for son's health. -Start Lexapro 5mg  daily. -Consider counseling services for additional emotional support.  Follow-up in 1 month to assess response to Lexapro and any new developments.

## 2023-06-06 NOTE — Patient Instructions (Signed)
VISIT SUMMARY:  During today's visit, we discussed your significant stress and weight loss, primarily related to concerns about your son William's health and family dynamics. We also addressed a lump on your neck that you noticed in July.  YOUR PLAN:  -UNEXPLAINED WEIGHT LOSS AND NECK swelling: Unexplained weight loss and a neck mass can be concerning for various health issues, including potential malignancy. We have ordered a CT scan of your chest, abdomen, and pelvis to investigate further. We will contact you with the results as soon as they are available.  -DEPRESSION: Depression can manifest as increased irritability, stress, and feeling overwhelmed. We have started you on Lexapro 5mg  daily to help manage these symptoms. Additionally, we recommend considering counseling services for further emotional support.  INSTRUCTIONS:  Please follow up in 1 month to assess your response to Lexapro and discuss any new developments. We will contact you with the results of your CT scan as soon as they are available.

## 2023-06-06 NOTE — Assessment & Plan Note (Addendum)
BP Readings from Last 3 Encounters:  06/06/23 130/65  05/07/23 122/66  07/25/22 (!) 116/93   Continue hydrochlorothiazide.

## 2023-06-15 ENCOUNTER — Ambulatory Visit (HOSPITAL_BASED_OUTPATIENT_CLINIC_OR_DEPARTMENT_OTHER): Admission: RE | Admit: 2023-06-15 | Payer: Medicare Other | Source: Ambulatory Visit

## 2023-06-15 ENCOUNTER — Ambulatory Visit (HOSPITAL_BASED_OUTPATIENT_CLINIC_OR_DEPARTMENT_OTHER)
Admission: RE | Admit: 2023-06-15 | Discharge: 2023-06-15 | Disposition: A | Discharge status: Home / Self Care | Payer: Medicare Other | Source: Ambulatory Visit | Attending: Family | Admitting: Family

## 2023-06-15 ENCOUNTER — Telehealth: Payer: Self-pay | Admitting: Family

## 2023-06-15 DIAGNOSIS — R634 Abnormal weight loss: Secondary | ICD-10-CM | POA: Insufficient documentation

## 2023-06-15 DIAGNOSIS — R591 Generalized enlarged lymph nodes: Secondary | ICD-10-CM | POA: Insufficient documentation

## 2023-06-15 DIAGNOSIS — R590 Localized enlarged lymph nodes: Secondary | ICD-10-CM | POA: Diagnosis not present

## 2023-06-15 DIAGNOSIS — J432 Centrilobular emphysema: Secondary | ICD-10-CM | POA: Diagnosis not present

## 2023-06-15 MED ORDER — IOHEXOL 300 MG/ML  SOLN
80.0000 mL | Freq: Once | INTRAMUSCULAR | Status: AC | PRN
  Administered 2023-06-15: 80 mL via INTRAVENOUS

## 2023-06-15 NOTE — Telephone Encounter (Signed)
Patient states CT scan is scheduled for 06/14/24 at 5pm, just wanted provider to know

## 2023-06-17 NOTE — Telephone Encounter (Signed)
Noted. Requested read from Radiology reading room.

## 2023-06-28 ENCOUNTER — Ambulatory Visit: Payer: Medicare Other

## 2023-06-28 VITALS — Ht <= 58 in | Wt 111.0 lb

## 2023-06-28 DIAGNOSIS — Z0000 Encounter for general adult medical examination without abnormal findings: Secondary | ICD-10-CM | POA: Diagnosis not present

## 2023-06-28 NOTE — Progress Notes (Signed)
Subjective:   Judith Flynn is a 82 y.o. female who presents for Medicare Annual (Subsequent) preventive examination.  Visit Complete: Virtual I connected with  [name] on 06/28/23 by a audio enabled telemedicine application and verified that I am speaking with the correct person using two identifiers.  Patient Location: Home  Provider Location: Home Office  I discussed the limitations of evaluation and management by telemedicine. The patient expressed understanding and agreed to proceed.  Vital Signs: Because this visit was a virtual/telehealth visit, some criteria may be missing or patient reported. Any vitals not documented were not able to be obtained and vitals that have been documented are patient reported.   Cardiac Risk Factors include: advanced age (>88men, >39 women);hypertension     Objective:    Today's Vitals   06/28/23 1100  Weight: 111 lb (50.3 kg)  Height: 4\' 9"  (1.448 m)   Body mass index is 24.02 kg/m.     06/28/2023   11:08 AM 07/07/2022   10:17 AM 03/31/2022   10:14 AM 07/08/2021   11:17 AM 07/09/2020   11:10 AM 06/03/2019    9:09 AM 07/10/2018   11:51 AM  Advanced Directives  Does Patient Have a Medical Advance Directive? No No No No No No No  Would patient like information on creating a medical advance directive? No - Patient declined No - Patient declined No - Patient declined No - Patient declined No - Patient declined No - Patient declined No - Patient declined    Current Medications (verified) Outpatient Encounter Medications as of 06/28/2023  Medication Sig   acetaminophen (TYLENOL) 325 MG tablet Take 650 mg by mouth every 6 (six) hours as needed.   atorvastatin (LIPITOR) 40 MG tablet TAKE ONE (1) TABLET BY MOUTH EVERY DAY   Calcium Carbonate-Vit D-Min (CALCIUM 1200) 1200-1000 MG-UNIT CHEW Chew 1 tablet by mouth daily.   Cholecalciferol (VITAMIN D3) 3000 units TABS Take 1 tablet by mouth daily.   escitalopram (LEXAPRO) 5 MG tablet  Take 1 tablet (5 mg total) by mouth daily.   fluticasone (FLONASE) 50 MCG/ACT nasal spray Place 2 sprays into both nostrils daily.   hydrochlorothiazide (HYDRODIURIL) 25 MG tablet TAKE ONE (1) TABLET BY MOUTH EVERY DAY   latanoprost (XALATAN) 0.005 % ophthalmic solution Place 1 drop into both eyes at bedtime.   lidocaine (XYLOCAINE) 2 % solution Use as directed 15 mLs in the mouth or throat every 4 (four) hours as needed for mouth pain.   Multiple Vitamins-Minerals (PRESERVISION AREDS 2) CAPS Take 1 capsule by mouth daily.   omeprazole (PRILOSEC) 40 MG capsule TAKE ONE CAPSULE BY MOUTH DAILY   zoledronic acid (RECLAST) 5 MG/100ML SOLN injection Inject 5 mg into the vein. Annual infusion   No facility-administered encounter medications on file as of 06/28/2023.    Allergies (verified) Prolia [denosumab], Bupropion, and Lisinopril   History: Past Medical History:  Diagnosis Date   Breast cancer (HCC) 2000   Left Breast Cancer   Cancer (HCC) 2000   breast- left breast- s/p lumpectomy and radiation   Colon polyp    Hearing loss    Hyperglycemia    Hyperlipidemia    Hypertension    Osteoporosis    Personal history of radiation therapy 2000   Left Breast Cancer   Tobacco abuse    Past Surgical History:  Procedure Laterality Date   BREAST BIOPSY Left 2000   BREAST LUMPECTOMY Left 2000   CATARACT EXTRACTION, BILATERAL Bilateral July and August 2015  CHOLECYSTECTOMY     surgery on ear drum     right   Family History  Problem Relation Age of Onset   Alcohol abuse Mother    Arthritis Mother    Hypertension Mother 4   Heart disease Mother        CHF   Alcohol abuse Father    Arthritis Father    Hypertension Father 71   Heart attack Father        Died of MI at age 66   Social History   Socioeconomic History   Marital status: Widowed    Spouse name: Not on file   Number of children: 3   Years of education: Not on file   Highest education level: GED or equivalent   Occupational History   Occupation: retired    Associate Professor: RETIRED  Tobacco Use   Smoking status: Former    Current packs/day: 0.00    Average packs/day: 0.5 packs/day for 50.0 years (25.0 ttl pk-yrs)    Types: Cigarettes    Start date: 60    Quit date: 2021    Years since quitting: 3.9   Smokeless tobacco: Never   Tobacco comments:    patient said she quit 10/2019  Vaping Use   Vaping status: Never Used  Substance and Sexual Activity   Alcohol use: Yes    Alcohol/week: 0.0 standard drinks of alcohol    Comment: Rare   Drug use: No   Sexual activity: Not Currently  Other Topics Concern   Not on file  Social History Narrative   Widow/ widower   Regular exercise- yes   Originally from [place]         Social Determinants of Health   Financial Resource Strain: Low Risk  (06/28/2023)   Overall Financial Resource Strain (CARDIA)    Difficulty of Paying Living Expenses: Not hard at all  Food Insecurity: No Food Insecurity (06/28/2023)   Hunger Vital Sign    Worried About Running Out of Food in the Last Year: Never true    Ran Out of Food in the Last Year: Never true  Transportation Needs: No Transportation Needs (06/28/2023)   PRAPARE - Administrator, Civil Service (Medical): No    Lack of Transportation (Non-Medical): No  Physical Activity: Inactive (06/28/2023)   Exercise Vital Sign    Days of Exercise per Week: 0 days    Minutes of Exercise per Session: 0 min  Stress: No Stress Concern Present (06/28/2023)   [employer] of Occupational Health - Occupational Stress Questionnaire    Feeling of Stress : Not at all  Recent Concern: Stress - Stress Concern Present (06/05/2023)   [employer] of Occupational Health - Occupational Stress Questionnaire    Feeling of Stress : To some extent  Social Connections: Socially Isolated (06/28/2023)   Social Connection and Isolation Panel [NHANES]    Frequency of Communication with Friends and Family: More than three  times a week    Frequency of Social Gatherings with Friends and Family: More than three times a week    Attends Religious Services: Never    Database administrator or Organizations: No    Attends Banker Meetings: Never    Marital Status: Widowed    Tobacco Counseling Counseling given: Not Answered Tobacco comments: patient said she quit 10/2019   Clinical Intake:  Pre-visit preparation completed: Yes  Pain : No/denies pain     BMI - recorded: 24.02 Nutritional Status: BMI of  19-24  Normal Nutritional Risks: None Diabetes: No  How often do you need to have someone help you when you read instructions, pamphlets, or other written materials from your doctor or pharmacy?: 1 - Never  Interpreter Needed?: No  Information entered by :: Theresa Mulligan LPN   Activities of Daily Living    06/28/2023   11:06 AM  In your present state of health, do you have any difficulty performing the following activities:  Hearing? 0  Vision? 0  Difficulty concentrating or making decisions? 0  Walking or climbing stairs? 0  Dressing or bathing? 0  Doing errands, shopping? 0  Preparing Food and eating ? N  Using the Toilet? N  In the past six months, have you accidently leaked urine? N  Do you have problems with loss of bowel control? N  Managing your Medications? N  Managing your Finances? N  Housekeeping or managing your Housekeeping? N    Patient Care Team: [name], NP as PCP - General Myna Hidalgo Rose Phi, MD as Attending Physician (Hematology and Oncology)  Indicate any recent Medical Services you may have received from other than [facility] providers in the past year (date may be approximate).     Assessment:   This is a routine wellness examination for Blayklee.  Hearing/Vision screen Hearing Screening - Comments:: Wears hearing aids Vision Screening - Comments:: Wears rx glasses - up to date with routine eye exams with  Dr Ainsley Spinner   Goals Addressed                This Visit's Progress     Help get my son well (pt-stated)         Depression Screen    06/28/2023   11:05 AM 06/06/2023   11:47 AM 03/31/2022   10:17 AM 01/16/2022    9:58 AM 06/23/2020    9:08 AM 06/03/2019    9:09 AM 07/04/2016    8:17 AM  PHQ 2/9 Scores  PHQ - 2 Score 0 3 0 0 0 0 0  PHQ- 9 Score 0 6         Fall Risk    06/28/2023   11:06 AM 06/06/2023   10:28 AM 03/31/2022   10:15 AM 01/16/2022    9:58 AM 06/03/2019    9:09 AM  Fall Risk   Falls in the past year? 0 0 0 0 0  Number falls in past yr: 0 0 0 0 0  Injury with Fall? 0 0 0 0 0  Risk for fall due to : No Fall Risks No Fall Risks No Fall Risks    Follow up Falls prevention discussed Falls evaluation completed Follow up appointment      MEDICARE RISK AT HOME: Medicare Risk at Home Any stairs in or around the home?: Yes If so, are there any without handrails?: No Home free of loose throw rugs in walkways, pet beds, electrical cords, etc?: Yes Adequate lighting in your home to reduce risk of falls?: Yes Life alert?: No Use of a cane, walker or w/c?: No Grab bars in the bathroom?: Yes Shower chair or bench in shower?: No Elevated toilet seat or a handicapped toilet?: No  TIMED UP AND GO:  Was the test performed?  No    Cognitive Function:    07/04/2016    8:18 AM  MMSE - Mini Mental State Exam  Orientation to time 5  Orientation to Place 5  Registration 3  Attention/ Calculation 5  Recall 2  Language- name 2 objects 2  Language- repeat 1  Language- follow 3 step command 3  Language- read & follow direction 1  Write a sentence 1  Copy design 1  Total score 29        06/28/2023   11:08 AM 03/31/2022   10:31 AM  6CIT Screen  What Year? 0 points 0 points  What month? 0 points 0 points  What time? 0 points 0 points  Count back from 20 0 points 0 points  Months in reverse 0 points 0 points  Repeat phrase 0 points 0 points  Total Score 0 points 0 points    Immunizations Immunization  History  Administered Date(s) Administered   Fluad Quad(high Dose 65+) 05/23/2019, 05/19/2020, 05/26/2022   Influenza Split 04/19/2011, 05/01/2012, 05/13/2021   Influenza Whole 05/03/2009, 06/03/2009, 05/02/2010   Influenza, High Dose Seasonal PF 06/10/2013, 05/05/2015, 05/31/2016, 07/12/2017, 05/16/2018   Influenza,inj,Quad PF,6+ Mos 05/22/2014   Influenza-Unspecified 05/24/2022   PFIZER Comirnaty(Gray Top)Covid-19 Tri-Sucrose Vaccine 11/26/2020   PFIZER(Purple Top)SARS-COV-2 Vaccination 08/20/2019   Pfizer Covid Bivalent Pediatric Vaccine(72mos to <81yrs) 05/13/2021   Pfizer(Comirnaty)Fall Seasonal Vaccine 12 years and older 05/24/2022   Pneumococcal Conjugate-13 02/10/2014   Pneumococcal Polysaccharide-23 05/07/2006, 06/03/2009   RSV IGIV 07/10/2022   Td 06/03/2009, 03/02/2020   Zoster Recombinant(Shingrix) 01/10/2021, 10/20/2021    TDAP status: Up to date  Flu Vaccine status: Due, Education has been provided regarding the importance of this vaccine. Advised may receive this vaccine at local pharmacy or Health Dept. Aware to provide a copy of the vaccination record if obtained from local pharmacy or Health Dept. Verbalized acceptance and understanding.  Pneumococcal vaccine status: Up to date  Covid-19 vaccine status: Declined, Education has been provided regarding the importance of this vaccine but patient still declined. Advised may receive this vaccine at local pharmacy or Health Dept.or vaccine clinic. Aware to provide a copy of the vaccination record if obtained from local pharmacy or Health Dept. Verbalized acceptance and understanding.  Qualifies for Shingles Vaccine? Yes   Zostavax completed Yes   Shingrix Completed?: Yes  Screening Tests Health Maintenance  Topic Date Due   INFLUENZA VACCINE  10/22/2023 (Originally 02/22/2023)   COVID-19 Vaccine (5 - 2023-24 season) 05/06/2024 (Originally 03/25/2023)   MAMMOGRAM  10/16/2023   Medicare Annual Wellness (AWV)  06/27/2024    DTaP/Tdap/Td (3 - Tdap) 03/02/2030   Pneumonia Vaccine 96+ Years old  Completed   DEXA SCAN  Completed   Zoster Vaccines- Shingrix  Completed   HPV VACCINES  Aged Out    Health Maintenance  There are no preventive care reminders to display for this patient.     Mammogram status: Completed 10/16/22. Repeat every year  Bone Density status: Completed 02/13/22. Results reflect: Bone density results: OSTEOPENIA. Repeat every   years.    Additional Screening:   Vision Screening: Recommended annual ophthalmology exams for early detection of glaucoma and other disorders of the eye. Is the patient up to date with their annual eye exam?  Yes  Who is the provider or what is the name of the office in which the patient attends annual eye exams? Dr Ainsley Spinner If pt is not established with a provider, would they like to be referred to a provider to establish care? No .   Dental Screening: Recommended annual dental exams for proper oral hygiene    Community Resource Referral / Chronic Care Management:  CRR required this visit?  No   CCM required this visit?  No  Plan:     I have personally reviewed and noted the following in the patient's chart:   Medical and social history Use of alcohol, tobacco or illicit drugs  Current medications and supplements including opioid prescriptions. Patient is not currently taking opioid prescriptions. Functional ability and status Nutritional status Physical activity Advanced directives List of other physicians Hospitalizations, surgeries, and ER visits in previous 12 months Vitals Screenings to include cognitive, depression, and falls Referrals and appointments  In addition, I have reviewed and discussed with patient certain preventive protocols, quality metrics, and best practice recommendations. A written personalized care plan for preventive services as well as general preventive health recommendations were provided to patient.      Tillie Rung, LPN   16/07/958   After Visit Summary: (MyChart) Due to this being a telephonic visit, the after visit summary with patients personalized plan was offered to patient via MyChart   Nurse Notes: None

## 2023-06-28 NOTE — Patient Instructions (Addendum)
Ms. Judith Flynn , Thank you for taking time to come for your Medicare Wellness Visit. I appreciate your ongoing commitment to your health goals. Please review the following plan we discussed and let me know if I can assist you in the future.   Referrals/Orders/Follow-Ups/Clinician Recommendations:   This is a list of the screening recommended for you and due dates:  Health Maintenance  Topic Date Due   Flu Shot  10/22/2023*   COVID-19 Vaccine (5 - 2023-24 season) 05/06/2024*   Mammogram  10/16/2023   Medicare Annual Wellness Visit  06/27/2024   DTaP/Tdap/Td vaccine (3 - Tdap) 03/02/2030   Pneumonia Vaccine  Completed   DEXA scan (bone density measurement)  Completed   Zoster (Shingles) Vaccine  Completed   HPV Vaccine  Aged Out  *Topic was postponed. The date shown is not the original due date.    Advanced directives: (Declined) Advance directive discussed with you today. Even though you declined this today, please call our office should you change your mind, and we can give you the proper paperwork for you to fill out.  Next Medicare Annual Wellness Visit scheduled for next year: Yes

## 2023-07-04 ENCOUNTER — Ambulatory Visit (INDEPENDENT_AMBULATORY_CARE_PROVIDER_SITE_OTHER): Payer: Medicare Other | Admitting: Family

## 2023-07-04 VITALS — BP 138/62 | HR 75 | Temp 98.7°F | Ht <= 58 in | Wt 110.0 lb

## 2023-07-04 DIAGNOSIS — F419 Anxiety disorder, unspecified: Secondary | ICD-10-CM | POA: Diagnosis not present

## 2023-07-04 DIAGNOSIS — L309 Dermatitis, unspecified: Secondary | ICD-10-CM | POA: Diagnosis not present

## 2023-07-04 DIAGNOSIS — R634 Abnormal weight loss: Secondary | ICD-10-CM | POA: Insufficient documentation

## 2023-07-04 DIAGNOSIS — F32A Depression, unspecified: Secondary | ICD-10-CM | POA: Diagnosis not present

## 2023-07-04 DIAGNOSIS — L821 Other seborrheic keratosis: Secondary | ICD-10-CM | POA: Diagnosis not present

## 2023-07-04 DIAGNOSIS — L570 Actinic keratosis: Secondary | ICD-10-CM | POA: Diagnosis not present

## 2023-07-04 DIAGNOSIS — D1801 Hemangioma of skin and subcutaneous tissue: Secondary | ICD-10-CM | POA: Diagnosis not present

## 2023-07-04 MED ORDER — ESCITALOPRAM OXALATE 5 MG PO TABS: 5.0000 mg | ORAL_TABLET | Freq: Every day | ORAL | 1 refills | Status: DC

## 2023-07-04 NOTE — Assessment & Plan Note (Signed)
  Improved mood and stress levels on low dose Lexapro. No adverse effects reported. -Continue Lexapro at current dose. -Send 90-day refill to [facility].

## 2023-07-04 NOTE — Patient Instructions (Signed)
VISIT SUMMARY:  During today's visit, we discussed your progress with stress and weight loss. You reported feeling better and more like yourself since starting Lexapro, with a noticeable decrease in stress. We also talked about your weight loss efforts and the challenges you face with appetite and early satiety, likely due to your hiatal hernia. Additionally, we reviewed your recent CT scan findings of emphysema and coronary artery calcification, although you are not experiencing any symptoms from these conditions.  YOUR PLAN:  -STRESS AND MOOD DISORDER: Your mood and stress levels have improved since starting Lexapro, and you have not experienced any adverse effects. Lexapro is a medication that helps manage anxiety and depression. We will continue your current dose and send a 90-day refill to [facility].  -WEIGHT LOSS AND APPETITE: You have experienced mild weight loss and improved appetite, but you feel full quickly, likely due to your hiatal hernia. A hiatal hernia occurs when part of the stomach pushes up through the diaphragm. Continue eating small, frequent meals to avoid overeating and discomfort.  -HIATAL HERNIA: Your early satiety and occasional pain are likely due to your hiatal hernia. This condition happens when part of your stomach pushes through the diaphragm. To manage this, continue with small, frequent meals to prevent overeating and discomfort.  INSTRUCTIONS:  Please continue taking Lexapro as prescribed and pick up your 90-day refill from [facility]. Maintain your current eating habits with small, frequent meals to manage your hiatal hernia symptoms. No additional interventions are needed for your emphysema and coronary artery calcification at this time, but we will keep monitoring these conditions.

## 2023-07-04 NOTE — Progress Notes (Signed)
Subjective:     Patient ID: Judith Flynn, female    DOB: Jun 08, 1941, 82 y.o.   MRN: [id number]  Chief Complaint  Patient presents with   Anxiety    Doing better on the medication     Discussed the use of AI scribe software for clinical note transcription with the patient, who gave verbal consent to proceed.  History of Present Illness    The patient, with a history of stress and weight loss, reports feeling 'good' and 'more like herself' since starting Lexapro. She notes a decrease in stress and overall improvement in mood. She still experiences some stress, but not to the degree prior to starting Lexapro. She has about 9-10 pills left and is open to continuing the medication due to its beneficial effects.  Last visit we also discussed her unintentional weight loss.  Thankfully, her CTA chest/abdomen and pelvis did not show any sign of malignacy. She reports an improved appetite but notes she can't eat a lot at one time due to feeling full quickly. Overeating leads to discomfort, possibly related to a known hiatal hernia. She is trying to eat six small meals a day and avoid overeating. She reports her home weight has steady at 109 over the last month.    Wt Readings from Last 3 Encounters:  07/04/23 110 lb (49.9 kg)  06/28/23 111 lb (50.3 kg)  06/06/23 111 lb (50.3 kg)      There are no preventive care reminders to display for this patient.  Past Medical History:  Diagnosis Date   Breast cancer (HCC) 2000   Left Breast Cancer   Cancer [facility]) 2000   breast- left breast- s/p lumpectomy and radiation   Colon polyp    Hearing loss    Hyperglycemia    Hyperlipidemia    Hypertension    Osteoporosis    Personal history of radiation therapy 2000   Left Breast Cancer   Tobacco abuse     Past Surgical History:  Procedure Laterality Date   BREAST BIOPSY Left 2000   BREAST LUMPECTOMY Left 2000   CATARACT EXTRACTION, BILATERAL Bilateral July and August 2015    CHOLECYSTECTOMY     surgery on ear drum     right    Family History  Problem Relation Age of Onset   Alcohol abuse Mother    Arthritis Mother    Hypertension Mother 29   Heart disease Mother        CHF   Alcohol abuse Father    Arthritis Father    Hypertension Father 71   Heart attack Father        Died of MI at age 75    Social History   Socioeconomic History   Marital status: Widowed    Spouse name: Not on file   Number of children: 3   Years of education: Not on file   Highest education level: GED or equivalent  Occupational History   Occupation: retired    Associate Professor: RETIRED  Tobacco Use   Smoking status: Former    Current packs/day: 0.00    Average packs/day: 0.5 packs/day for 50.0 years (25.0 ttl pk-yrs)    Types: Cigarettes    Start date: 73    Quit date: 2021    Years since quitting: 3.9   Smokeless tobacco: Never   Tobacco comments:    patient said she quit 10/2019  Vaping Use   Vaping status: Never Used  Substance and Sexual Activity   Alcohol  use: Yes    Alcohol/week: 0.0 standard drinks of alcohol    Comment: Rare   Drug use: No   Sexual activity: Not Currently  Other Topics Concern   Not on file  Social History Narrative   Widow/ widower   Regular exercise- yes   Originally from [place]         Social Determinants of Health   Financial Resource Strain: Low Risk  (06/28/2023)   Overall Financial Resource Strain (CARDIA)    Difficulty of Paying Living Expenses: Not hard at all  Food Insecurity: No Food Insecurity (06/28/2023)   Hunger Vital Sign    Worried About Running Out of Food in the Last Year: Never true    Ran Out of Food in the Last Year: Never true  Transportation Needs: No Transportation Needs (06/28/2023)   PRAPARE - Administrator, Civil Service (Medical): No    Lack of Transportation (Non-Medical): No  Physical Activity: Inactive (06/28/2023)   Exercise Vital Sign    Days of Exercise per Week: 0 days    Minutes of  Exercise per Session: 0 min  Stress: No Stress Concern Present (06/28/2023)   [employer] of Occupational Health - Occupational Stress Questionnaire    Feeling of Stress : Not at all  Recent Concern: Stress - Stress Concern Present (06/05/2023)   [employer] of Occupational Health - Occupational Stress Questionnaire    Feeling of Stress : To some extent  Social Connections: Socially Isolated (06/28/2023)   Social Connection and Isolation Panel [NHANES]    Frequency of Communication with Friends and Family: More than three times a week    Frequency of Social Gatherings with Friends and Family: More than three times a week    Attends Religious Services: Never    Database administrator or Organizations: No    Attends Banker Meetings: Never    Marital Status: Widowed  Intimate Partner Violence: Not At Risk (06/28/2023)   Humiliation, Afraid, Rape, and Kick questionnaire    Fear of Current or Ex-Partner: No    Emotionally Abused: No    Physically Abused: No    Sexually Abused: No    Outpatient Medications Prior to Visit  Medication Sig Dispense Refill   acetaminophen (TYLENOL) 325 MG tablet Take 650 mg by mouth every 6 (six) hours as needed.     atorvastatin (LIPITOR) 40 MG tablet TAKE ONE (1) TABLET BY MOUTH EVERY DAY 90 tablet 3   Calcium Carbonate-Vit D-Min (CALCIUM 1200) 1200-1000 MG-UNIT CHEW Chew 1 tablet by mouth daily.     Cholecalciferol (VITAMIN D3) 3000 units TABS Take 1 tablet by mouth daily. 30 tablet    fluticasone (FLONASE) 50 MCG/ACT nasal spray Place 2 sprays into both nostrils daily. 16 g 6   hydrochlorothiazide (HYDRODIURIL) 25 MG tablet TAKE ONE (1) TABLET BY MOUTH EVERY DAY 90 tablet 1   latanoprost (XALATAN) 0.005 % ophthalmic solution Place 1 drop into both eyes at bedtime.     lidocaine (XYLOCAINE) 2 % solution Use as directed 15 mLs in the mouth or throat every 4 (four) hours as needed for mouth pain. 100 mL 0   Multiple Vitamins-Minerals  (PRESERVISION AREDS 2) CAPS Take 1 capsule by mouth daily.     omeprazole (PRILOSEC) 40 MG capsule TAKE ONE CAPSULE BY MOUTH DAILY 90 capsule 1   zoledronic acid (RECLAST) 5 MG/100ML SOLN injection Inject 5 mg into the vein. Annual infusion     escitalopram (LEXAPRO) 5 MG  tablet Take 1 tablet (5 mg total) by mouth daily. 30 tablet 2   No facility-administered medications prior to visit.    Allergies  Allergen Reactions   Prolia [Denosumab]     Jaw pain and tightness   Bupropion Other (See Comments)    tremors   Lisinopril Cough    ROS    See HPI Objective:    Physical Exam Constitutional:      Appearance: Normal appearance.  Skin:    General: Skin is warm.  Neurological:     Mental Status: She is alert and oriented to person, place, and time.  Psychiatric:        Mood and Affect: Mood normal.        Behavior: Behavior normal.        Thought Content: Thought content normal.        Judgment: Judgment normal.      BP 138/62 (BP Location: Right Arm, Patient Position: Sitting, Cuff Size: Small)   Pulse 75   Temp 98.7 F (37.1 C) (Oral)   Ht 4\' 9"  (1.448 m)   Wt 110 lb (49.9 kg)   SpO2 100%   BMI 23.80 kg/m  Wt Readings from Last 3 Encounters:  07/04/23 110 lb (49.9 kg)  06/28/23 111 lb (50.3 kg)  06/06/23 111 lb (50.3 kg)       Assessment & Flynn:   Problem List Items Addressed This Visit       Unprioritized   Weight loss - Primary     Mild weight loss noted. Improved appetite but early satiety likely secondary to hiatal hernia. -Continue with small, frequent meals to avoid overeating and exacerbating hiatal hernia symptoms.      Anxiety and depression     Improved mood and stress levels on low dose Lexapro. No adverse effects reported. -Continue Lexapro at current dose. -Send 90-day refill to [facility].       Relevant Medications   escitalopram (LEXAPRO) 5 MG tablet    I am having Liam Rogers. Wirtz maintain her Calcium 1200, zoledronic  acid, acetaminophen, PreserVision AREDS 2, Vitamin D3, latanoprost, hydrochlorothiazide, atorvastatin, omeprazole, lidocaine, fluticasone, and escitalopram.  Meds ordered this encounter  Medications   escitalopram (LEXAPRO) 5 MG tablet    Sig: Take 1 tablet (5 mg total) by mouth daily.    Dispense:  90 tablet    Refill:  1    Order Specific Question:   Supervising Provider    Answer:   Danise Edge A [[id number]]

## 2023-07-04 NOTE — Assessment & Plan Note (Signed)
  Mild weight loss noted. Improved appetite but early satiety likely secondary to hiatal hernia. -Continue with small, frequent meals to avoid overeating and exacerbating hiatal hernia symptoms.

## 2023-07-05 DIAGNOSIS — H43823 Vitreomacular adhesion, bilateral: Secondary | ICD-10-CM | POA: Diagnosis not present

## 2023-07-05 DIAGNOSIS — H43813 Vitreous degeneration, bilateral: Secondary | ICD-10-CM | POA: Diagnosis not present

## 2023-07-05 DIAGNOSIS — H353122 Nonexudative age-related macular degeneration, left eye, intermediate dry stage: Secondary | ICD-10-CM | POA: Diagnosis not present

## 2023-07-05 DIAGNOSIS — H401134 Primary open-angle glaucoma, bilateral, indeterminate stage: Secondary | ICD-10-CM | POA: Diagnosis not present

## 2023-07-05 DIAGNOSIS — H353114 Nonexudative age-related macular degeneration, right eye, advanced atrophic with subfoveal involvement: Secondary | ICD-10-CM | POA: Diagnosis not present

## 2023-07-05 DIAGNOSIS — H35033 Hypertensive retinopathy, bilateral: Secondary | ICD-10-CM | POA: Diagnosis not present

## 2023-07-05 DIAGNOSIS — H35423 Microcystoid degeneration of retina, bilateral: Secondary | ICD-10-CM | POA: Diagnosis not present

## 2023-07-09 ENCOUNTER — Inpatient Hospital Stay: Payer: Medicare Other

## 2023-07-09 ENCOUNTER — Other Ambulatory Visit: Payer: Self-pay

## 2023-07-09 ENCOUNTER — Inpatient Hospital Stay: Payer: Medicare Other | Attending: Hematology & Oncology

## 2023-07-09 ENCOUNTER — Inpatient Hospital Stay: Payer: Medicare Other | Admitting: Medical Oncology

## 2023-07-09 ENCOUNTER — Encounter: Payer: Self-pay | Admitting: Medical Oncology

## 2023-07-09 VITALS — BP 136/54 | HR 60 | Temp 98.5°F | Resp 18 | Ht <= 58 in | Wt 110.8 lb

## 2023-07-09 DIAGNOSIS — R634 Abnormal weight loss: Secondary | ICD-10-CM

## 2023-07-09 DIAGNOSIS — D0512 Intraductal carcinoma in situ of left breast: Secondary | ICD-10-CM | POA: Diagnosis not present

## 2023-07-09 DIAGNOSIS — M810 Age-related osteoporosis without current pathological fracture: Secondary | ICD-10-CM | POA: Diagnosis not present

## 2023-07-09 DIAGNOSIS — Z86000 Personal history of in-situ neoplasm of breast: Secondary | ICD-10-CM | POA: Diagnosis not present

## 2023-07-09 DIAGNOSIS — Z853 Personal history of malignant neoplasm of breast: Secondary | ICD-10-CM

## 2023-07-09 LAB — CMP (CANCER CENTER ONLY)
ALT: 7 U/L (ref 0–44)
AST: 10 U/L — ABNORMAL LOW (ref 15–41)
Albumin: 4.2 g/dL (ref 3.5–5.0)
Alkaline Phosphatase: 50 U/L (ref 38–126)
Anion gap: 7 (ref 5–15)
BUN: 16 mg/dL (ref 8–23)
CO2: 29 mmol/L (ref 22–32)
Calcium: 9.7 mg/dL (ref 8.9–10.3)
Chloride: 102 mmol/L (ref 98–111)
Creatinine: 0.94 mg/dL (ref 0.44–1.00)
GFR, Estimated: >60 mL/min (ref >60)
Glucose, Bld: 105 mg/dL — ABNORMAL HIGH (ref 70–99)
Potassium: 3.8 mmol/L (ref 3.5–5.1)
Sodium: 138 mmol/L (ref 135–145)
Total Bilirubin: 0.6 mg/dL (ref <1.2)
Total Protein: 6.7 g/dL (ref 6.5–8.1)

## 2023-07-09 LAB — CBC WITH DIFFERENTIAL (CANCER CENTER ONLY)
Abs Immature Granulocytes: 0 10*3/uL (ref 0.00–0.07)
Basophils Absolute: 0 10*3/uL (ref 0.0–0.1)
Basophils Relative: 1 %
Eosinophils Absolute: 0 10*3/uL (ref 0.0–0.5)
Eosinophils Relative: 1 %
HCT: 38.5 % (ref 36.0–46.0)
Hemoglobin: 13.2 g/dL (ref 12.0–15.0)
Immature Granulocytes: 0 %
Lymphocytes Relative: 18 %
Lymphs Abs: 0.8 10*3/uL (ref 0.7–4.0)
MCH: 31.7 pg (ref 26.0–34.0)
MCHC: 34.3 g/dL (ref 30.0–36.0)
MCV: 92.3 fL (ref 80.0–100.0)
Monocytes Absolute: 0.3 10*3/uL (ref 0.1–1.0)
Monocytes Relative: 8 %
Neutro Abs: 3.2 10*3/uL (ref 1.7–7.7)
Neutrophils Relative %: 72 %
Platelet Count: 193 10*3/uL (ref 150–400)
RBC: 4.17 MIL/uL (ref 3.87–5.11)
RDW: 12.5 % (ref 11.5–15.5)
WBC Count: 4.5 10*3/uL (ref 4.0–10.5)
nRBC: 0 % (ref 0.0–0.2)

## 2023-07-09 LAB — TSH: TSH: 2.382 u[IU]/mL (ref 0.350–4.500)

## 2023-07-09 MED ORDER — ZOLEDRONIC ACID 4 MG/100ML IV SOLN
4.0000 mg | Freq: Once | INTRAVENOUS | Status: AC
  Administered 2023-07-09: 4 mg via INTRAVENOUS
  Filled 2023-07-09: qty 100

## 2023-07-09 MED ORDER — SODIUM CHLORIDE 0.9 % IV SOLN: Freq: Once | INTRAVENOUS | Status: AC

## 2023-07-09 NOTE — Progress Notes (Signed)
Hematology and Oncology Follow Up Visit  Judith Flynn [id number] 03-Feb-1941 82 y.o. 07/09/2023   Principle Diagnosis:  Ductal carcinoma in situ of the left breast Osteoporosis   Past Therapy: Prolia stopped 07/08/2021 due to jaw pain and tightness with previous dose   Current Therapy:        Zometa IV Yearly   Interim History:  Judith Flynn is here today for follow-up Zometa.    She reports that she has lost about 30 pounds over the past year. Her PCP has been following this closely. She recently had a CT which did not show any metastatic diease fortunately. She is not eating so well. She is working on this. She has had a tremendous amount of stress due to caring for her son who is having many health challenges.   She denies any dental pains or concerns. No upcoming dental work. No jaw pain.   She has had no issues with COVID.  There is been no fever.  She has had no change in bowel or bladder habits.  She has had no cough or shortness of breath.  She has had no rashes.  There is been no bleeding.  She has had a good appetite.  Her last mammogram was on 10/17/2022.  BI-RADS 1  Currently, I would have to say that her performance status is probably ECOG 1. Wt Readings from Last 3 Encounters:  07/09/23 110 lb 12.8 oz (50.3 kg)  07/04/23 110 lb (49.9 kg)  06/28/23 111 lb (50.3 kg)     Medications:  Allergies as of 07/09/2023       Reactions   Prolia [denosumab] Other (See Comments)   Jaw pain and tightness. Hard to open mouth.   Bupropion Other (See Comments)   tremors   Lisinopril Cough        Medication List        Accurate as of July 09, 2023 11:18 AM. If you have any questions, ask your nurse or doctor.          STOP taking these medications    fluticasone 50 MCG/ACT nasal spray Commonly known as: FLONASE Stopped by: Rushie Chestnut   lidocaine 2 % solution Commonly known as: XYLOCAINE Stopped by: Rushie Chestnut       TAKE these  medications    acetaminophen 325 MG tablet Commonly known as: TYLENOL Take 650 mg by mouth every 6 (six) hours as needed.   atorvastatin 40 MG tablet Commonly known as: LIPITOR TAKE ONE (1) TABLET BY MOUTH EVERY DAY   Calcium 1200 1200-1000 MG-UNIT Chew Chew 1 tablet by mouth daily.   dorzolamide-timolol 2-0.5 % ophthalmic solution Commonly known as: COSOPT Place 1 drop into the right eye 2 (two) times daily.   escitalopram 5 MG tablet Commonly known as: Lexapro Take 1 tablet (5 mg total) by mouth daily.   hydrochlorothiazide 25 MG tablet Commonly known as: HYDRODIURIL TAKE ONE (1) TABLET BY MOUTH EVERY DAY   latanoprost 0.005 % ophthalmic solution Commonly known as: XALATAN Place 1 drop into both eyes at bedtime.   omeprazole 40 MG capsule Commonly known as: PRILOSEC TAKE ONE CAPSULE BY MOUTH DAILY   PreserVision AREDS 2 Caps Take 1 capsule by mouth daily.   Vitamin D3 75 MCG (3000 UT) Tabs Take 1 tablet by mouth daily.   zoledronic acid 5 MG/100ML Soln injection Commonly known as: RECLAST Inject 5 mg into the vein. Annual infusion        Allergies:  Allergies  Allergen Reactions   Prolia [Denosumab] Other (See Comments)    Jaw pain and tightness. Hard to open mouth.   Bupropion Other (See Comments)    tremors   Lisinopril Cough    Past Medical History, Surgical history, Social history, and Family History were reviewed and updated.  Review of Systems: Review of Systems  Constitutional: Negative.   HENT: Negative.    Eyes: Negative.   Respiratory: Negative.    Cardiovascular: Negative.   Gastrointestinal: Negative.   Genitourinary: Negative.   Musculoskeletal: Negative.   Skin: Negative.   Neurological: Negative.   Endo/Heme/Allergies: Negative.   Psychiatric/Behavioral: Negative.       Physical Exam:  height is 4\' 9"  (1.448 m) and weight is 110 lb 12.8 oz (50.3 kg). Her oral temperature is 98.5 F (36.9 C). Her blood pressure is 136/54  (abnormal) and her pulse is 60. Her respiration is 18 and oxygen saturation is 100%.   Wt Readings from Last 3 Encounters:  07/09/23 110 lb 12.8 oz (50.3 kg)  07/04/23 110 lb (49.9 kg)  06/28/23 111 lb (50.3 kg)    Physical Exam Vitals reviewed.  Constitutional:      Comments: Deferred breast exam  HENT:     Head: Normocephalic and atraumatic.  Eyes:     Pupils: Pupils are equal, round, and reactive to light.  Cardiovascular:     Rate and Rhythm: Normal rate and regular rhythm.     Heart sounds: Normal heart sounds.  Pulmonary:     Effort: Pulmonary effort is normal.     Breath sounds: Normal breath sounds.  Abdominal:     General: Bowel sounds are normal.     Palpations: Abdomen is soft.  Musculoskeletal:        General: No tenderness or deformity. Normal range of motion.     Cervical back: Normal range of motion.     Comments: She does have little bit of kyphosis.  Lymphadenopathy:     Cervical: No cervical adenopathy.  Skin:    General: Skin is warm and dry.     Findings: No erythema or rash.  Neurological:     Mental Status: She is alert and oriented to person, place, and time.  Psychiatric:        Behavior: Behavior normal.        Thought Content: Thought content normal.        Judgment: Judgment normal.      Lab Results  Component Value Date   WBC 4.5 07/09/2023   HGB 13.2 07/09/2023   HCT 38.5 07/09/2023   MCV 92.3 07/09/2023   PLT 193 07/09/2023   No results found for: "FERRITIN", "IRON", "TIBC", "UIBC", "IRONPCTSAT" Lab Results  Component Value Date   RBC 4.17 07/09/2023   No results found for: "KPAFRELGTCHN", "LAMBDASER", "KAPLAMBRATIO" No results found for: "IGGSERUM", "IGA", "IGMSERUM" No results found for: "TOTALPROTELP", "ALBUMINELP", "A1GS", "A2GS", "BETS", "BETA2SER", "GAMS", "MSPIKE", "SPEI"   Chemistry      Component Value Date/Time   NA 138 07/09/2023 1022   NA 146 (H) 06/27/2017 0903   K 3.8 07/09/2023 1022   K 3.9 06/27/2017 0903    CL 102 07/09/2023 1022   CL 106 06/27/2017 0903   CO2 29 07/09/2023 1022   CO2 27 06/27/2017 0903   BUN 16 07/09/2023 1022   BUN 11 06/27/2017 0903   CREATININE 0.94 07/09/2023 1022   CREATININE 0.8 06/27/2017 0903      Component Value Date/Time   CALCIUM  9.7 07/09/2023 1022   CALCIUM 9.2 06/27/2017 0903   ALKPHOS 50 07/09/2023 1022   ALKPHOS 68 06/27/2017 0903   AST 10 (L) 07/09/2023 1022   ALT 7 07/09/2023 1022   ALT 16 06/27/2017 0903   BILITOT 0.6 07/09/2023 1022     Encounter Diagnoses  Name Primary?   Unintentional weight loss Yes   Osteoporosis without current pathological fracture, unspecified osteoporosis type    Ductal carcinoma in situ (DCIS) of left breast    Impression and Plan: Ms. Iglesia is a very pleasant 82 yo caucasian female with history of ductal carcinoma in situ of the left breast almost 22 years ago.  CMP reviewed. We will go ahead and give her the Zometa today. I have also reviewed her CT scan. Will add a TSH to labs to ensure this is normal and not a potential cause of her weight loss. She will work on increasing her oral intake and reduce her stress level whenever possible Continue close follow up by PCP.   Zometa today Adding TSH to labs RTC 1 year MD, labs(CBC, CMP), Zometa   Rushie Chestnut, PA-C 12/16/202411:18 AM

## 2023-07-09 NOTE — Patient Instructions (Signed)

## 2023-07-11 ENCOUNTER — Inpatient Hospital Stay: Payer: Medicare Other | Admitting: Dietician

## 2023-07-11 NOTE — Progress Notes (Signed)
Nutrition Assessment Reached out to patient at home telephone#  Reason for Assessment: MST screen for weight loss.    ASSESSMENT: Patient is 82 year old female with history of Ductal carcinoma in situ of the left breast and Osteoporosis.  Current Therapy includes Zometa IV yearly.  Past year she has lost 20# and reports this is related to stress of caring for son with many health conditions.  Son doesn't live with her but she reports she is is main care taker.  She is attempting to eat better and more frequently.  Recent PO recall reflects she is trying to eat more frequently yesterday had cereal with blueberries (or will eat banana) pancakes for lunch (often likes to eat sandwiches (peanut butter & banana) and dinner was pecan pie.likes her sweets.Judith Flynn  doesn't like taste or mouth feel of Ensure products but does enjoy Fairlife milk.   She states she feels good at her CBW and has energy.  Receptive to try to maintain CBW.   Anthropometrics:   Height: 57" Weight: 110.7# UBW: 126-132# (2023) BMI: 23.98    NUTRITION DIAGNOSIS: Inadequate PO intake to meet increased nutrient needs, r/t stress of son's health conditions and and appointments  INTERVENTION:  Relayed that nutrition services are wrap around service provided at no charge and encouraged continued communication if experiencing continued weight loss or any nutritional impact symptoms (NIS). Educated on importance of adequate calorie and protein energy intake  with nutrient dense foods when possible to maintain weight/strength Discussed higher calories and protein options to aid in weight maintenance. Encouraged more frequent feeds and encouraged at least 3 meals wit high protein snacks.   Encouraged her to continue to monitor her weight at home. Provided contact information to reach out if unable to maintain her weight   MONITORING, EVALUATION, GOAL: weight, PO intake, Nutrition Impact Symptoms, labs Goal is weight  maintenance  Next Visit: PRN at patient or provider request  Gennaro Africa, RDN, LDN Registered Dietitian, [facility] Part Time Remote (Usual office hours: Tuesday-Thursday) Cell: [phone]

## 2023-07-25 DIAGNOSIS — H43812 Vitreous degeneration, left eye: Secondary | ICD-10-CM

## 2023-07-25 HISTORY — DX: Vitreous degeneration, left eye: H43.812

## 2023-08-09 DIAGNOSIS — H353114 Nonexudative age-related macular degeneration, right eye, advanced atrophic with subfoveal involvement: Secondary | ICD-10-CM | POA: Diagnosis not present

## 2023-08-09 DIAGNOSIS — H35033 Hypertensive retinopathy, bilateral: Secondary | ICD-10-CM | POA: Diagnosis not present

## 2023-08-09 DIAGNOSIS — H353122 Nonexudative age-related macular degeneration, left eye, intermediate dry stage: Secondary | ICD-10-CM | POA: Diagnosis not present

## 2023-08-09 DIAGNOSIS — H43813 Vitreous degeneration, bilateral: Secondary | ICD-10-CM | POA: Diagnosis not present

## 2023-08-09 DIAGNOSIS — H401134 Primary open-angle glaucoma, bilateral, indeterminate stage: Secondary | ICD-10-CM | POA: Diagnosis not present

## 2023-08-09 DIAGNOSIS — H35423 Microcystoid degeneration of retina, bilateral: Secondary | ICD-10-CM | POA: Diagnosis not present

## 2023-08-09 DIAGNOSIS — H43823 Vitreomacular adhesion, bilateral: Secondary | ICD-10-CM | POA: Diagnosis not present

## 2023-08-09 DIAGNOSIS — H348122 Central retinal vein occlusion, left eye, stable: Secondary | ICD-10-CM | POA: Diagnosis not present

## 2023-08-20 ENCOUNTER — Encounter: Payer: Self-pay | Admitting: Family

## 2023-08-20 DIAGNOSIS — H348122 Central retinal vein occlusion, left eye, stable: Secondary | ICD-10-CM | POA: Insufficient documentation

## 2023-08-20 DIAGNOSIS — H35039 Hypertensive retinopathy, unspecified eye: Secondary | ICD-10-CM | POA: Insufficient documentation

## 2023-09-03 DIAGNOSIS — H401134 Primary open-angle glaucoma, bilateral, indeterminate stage: Secondary | ICD-10-CM | POA: Diagnosis not present

## 2023-09-03 DIAGNOSIS — H43823 Vitreomacular adhesion, bilateral: Secondary | ICD-10-CM | POA: Diagnosis not present

## 2023-09-03 DIAGNOSIS — H43813 Vitreous degeneration, bilateral: Secondary | ICD-10-CM | POA: Diagnosis not present

## 2023-09-03 DIAGNOSIS — H353114 Nonexudative age-related macular degeneration, right eye, advanced atrophic with subfoveal involvement: Secondary | ICD-10-CM | POA: Diagnosis not present

## 2023-09-03 DIAGNOSIS — H35033 Hypertensive retinopathy, bilateral: Secondary | ICD-10-CM | POA: Diagnosis not present

## 2023-09-03 DIAGNOSIS — H353122 Nonexudative age-related macular degeneration, left eye, intermediate dry stage: Secondary | ICD-10-CM | POA: Diagnosis not present

## 2023-09-03 DIAGNOSIS — H348122 Central retinal vein occlusion, left eye, stable: Secondary | ICD-10-CM | POA: Diagnosis not present

## 2023-09-21 ENCOUNTER — Telehealth: Payer: Self-pay

## 2023-09-21 DIAGNOSIS — H9190 Unspecified hearing loss, unspecified ear: Secondary | ICD-10-CM

## 2023-09-21 NOTE — Telephone Encounter (Signed)
 Copied from CRM [id number]. Topic: Referral - Question >> Sep 21, 2023  2:18 PM Fredrich Romans wrote: Reason for CRM: [facility] called and stated that patient has an appointment with them on March 6,2025,however they are needing a referral.She is being seen for an routine hearing test. Fax#:[phone]

## 2023-09-21 NOTE — Addendum Note (Signed)
 Addended by: [name] on: 09/21/2023 03:55 PM   Modules accepted: Orders

## 2023-09-27 DIAGNOSIS — H906 Mixed conductive and sensorineural hearing loss, bilateral: Secondary | ICD-10-CM | POA: Diagnosis not present

## 2023-10-08 ENCOUNTER — Telehealth: Payer: Self-pay | Admitting: Family

## 2023-10-08 ENCOUNTER — Ambulatory Visit (INDEPENDENT_AMBULATORY_CARE_PROVIDER_SITE_OTHER): Payer: Medicare Other | Admitting: Family

## 2023-10-08 ENCOUNTER — Other Ambulatory Visit: Payer: Self-pay | Admitting: Family

## 2023-10-08 VITALS — BP 117/63 | HR 64 | Temp 98.7°F | Resp 16 | Ht <= 58 in | Wt 110.0 lb

## 2023-10-08 DIAGNOSIS — M8000XD Age-related osteoporosis with current pathological fracture, unspecified site, subsequent encounter for fracture with routine healing: Secondary | ICD-10-CM | POA: Diagnosis not present

## 2023-10-08 DIAGNOSIS — K219 Gastro-esophageal reflux disease without esophagitis: Secondary | ICD-10-CM

## 2023-10-08 DIAGNOSIS — E876 Hypokalemia: Secondary | ICD-10-CM | POA: Insufficient documentation

## 2023-10-08 DIAGNOSIS — I10 Essential (primary) hypertension: Secondary | ICD-10-CM | POA: Diagnosis not present

## 2023-10-08 DIAGNOSIS — E785 Hyperlipidemia, unspecified: Secondary | ICD-10-CM

## 2023-10-08 DIAGNOSIS — F419 Anxiety disorder, unspecified: Secondary | ICD-10-CM

## 2023-10-08 DIAGNOSIS — R739 Hyperglycemia, unspecified: Secondary | ICD-10-CM | POA: Diagnosis not present

## 2023-10-08 DIAGNOSIS — H3530 Unspecified macular degeneration: Secondary | ICD-10-CM

## 2023-10-08 DIAGNOSIS — F32A Depression, unspecified: Secondary | ICD-10-CM

## 2023-10-08 DIAGNOSIS — H9190 Unspecified hearing loss, unspecified ear: Secondary | ICD-10-CM

## 2023-10-08 DIAGNOSIS — E119 Type 2 diabetes mellitus without complications: Secondary | ICD-10-CM | POA: Insufficient documentation

## 2023-10-08 LAB — LIPID PANEL
Cholesterol: 153 mg/dL (ref 0–200)
HDL: 58.5 mg/dL (ref >39.00)
LDL Cholesterol: 83 mg/dL (ref 0–99)
NonHDL: 94.1
Total CHOL/HDL Ratio: 3
Triglycerides: 56 mg/dL (ref 0.0–149.0)
VLDL: 11.2 mg/dL (ref 0.0–40.0)

## 2023-10-08 LAB — BASIC METABOLIC PANEL
BUN: 22 mg/dL (ref 6–23)
CO2: 32 meq/L (ref 19–32)
Calcium: 9.1 mg/dL (ref 8.4–10.5)
Chloride: 101 meq/L (ref 96–112)
Creatinine, Ser: 1.12 mg/dL (ref 0.40–1.20)
GFR: 45.25 mL/min — ABNORMAL LOW (ref >60.00)
Glucose, Bld: 105 mg/dL — ABNORMAL HIGH (ref 70–99)
Potassium: 3.3 meq/L — ABNORMAL LOW (ref 3.5–5.1)
Sodium: 142 meq/L (ref 135–145)

## 2023-10-08 LAB — HEMOGLOBIN A1C: Hgb A1c MFr Bld: 6.5 % (ref 4.6–6.5)

## 2023-10-08 MED ORDER — ESCITALOPRAM OXALATE 5 MG PO TABS: 5.0000 mg | ORAL_TABLET | Freq: Every day | ORAL | 1 refills | Status: AC

## 2023-10-08 MED ORDER — OMEPRAZOLE 40 MG PO CPDR: DELAYED_RELEASE_CAPSULE | ORAL | 1 refills | Status: DC

## 2023-10-08 MED ORDER — HYDROCHLOROTHIAZIDE 25 MG PO TABS: 25.0000 mg | ORAL_TABLET | Freq: Every day | ORAL | 1 refills | Status: DC

## 2023-10-08 NOTE — Telephone Encounter (Signed)
 Also, her A1C has risen to 6.5 which is in the diet controlled diabetes range. Continue to work on healthy diet and regular exercise.

## 2023-10-08 NOTE — Assessment & Plan Note (Signed)
 Stable, continues annual reclast with oncology.

## 2023-10-08 NOTE — Telephone Encounter (Signed)
 Potassium is a little low- likely due to her hydrochlorothiazide.  Please start kdur once daily, repeat bmet in 1 week.

## 2023-10-08 NOTE — Assessment & Plan Note (Signed)
 Maintained on atorvastatin 40mg .  Lab Results  Component Value Date   CHOL 157 01/16/2022   HDL 59.30 01/16/2022   LDLCALC 86 01/16/2022   LDLDIRECT 100.0 02/19/2015   TRIG 59.0 01/16/2022   CHOLHDL 3 01/16/2022   Will update lipid panel.

## 2023-10-08 NOTE — Assessment & Plan Note (Signed)
Stable on lexapro 5mg. Continue same.  

## 2023-10-08 NOTE — Assessment & Plan Note (Signed)
 BP Readings from Last 3 Encounters:  10/08/23 117/63  07/09/23 (!) 136/54  07/04/23 138/62   Stable on hydrochlorothiazide, continue same.

## 2023-10-08 NOTE — Patient Instructions (Signed)
 VISIT SUMMARY:  Judith Flynn, during your visit today, we discussed your medications and routine care. You mentioned a sensation of swelling in your neck, which we identified as muscle tissue due to weight loss. We also reviewed your cholesterol, blood pressure, and other health conditions.  YOUR PLAN:  -MUSCLE PROMINENCE IN NECK: The swelling in your neck is due to muscle tissue, likely from weight loss, and not a lymph node. No further action is needed at this time.  -ELEVATED A1C: Your A1c level was 6.1 a year ago, which is slightly elevated but not in the diabetic range. We will monitor this by ordering an A1c test.  -HYPERLIPIDEMIA: You are taking atorvastatin 40 mg for cholesterol management. Your last cholesterol check was over a year ago and showed good levels. We will monitor this by ordering a cholesterol test.  -HYPERTENSION: Your blood pressure is well-controlled at 117/63 with hydrochlorothiazide. No changes are needed.  -GASTROESOPHAGEAL REFLUX DISEASE (GERD): You are taking Prilosec daily, which effectively manages your reflux symptoms. Continue with the current treatment.  -ANXIETY: You are taking Lexapro 5 mg for anxiety and stress, which you find helpful. Although you occasionally experience agitation, you wish to continue your current dose of lexapro. We will refill your Lexapro prescription.  -OSTEOPOROSIS: You receive annual Reclast infusions for osteoporosis, with the last infusion done in December 2024. Continue with the current treatment.  -MACULAR DEGENERATION AND GLAUCOMA: You are under regular care with your ophthalmologist, Dr. Kennith Center, for macular degeneration and Dr. Quinn Axe for glaucoma. Continue with your regular follow-ups.  -GENERAL HEALTH MAINTENANCE: You are up to date with your COVID and flu vaccinations. Keep maintaining a healthy lifestyle.  INSTRUCTIONS:  Please schedule a follow-up appointment in six months unless you need to visit earlier. We will also  order an A1c test and a cholesterol test to monitor your levels.

## 2023-10-08 NOTE — Assessment & Plan Note (Signed)
 Lab Results  Component Value Date   HGBA1C 6.1 07/25/2022   Mild elevation.  Will update today.

## 2023-10-08 NOTE — Assessment & Plan Note (Signed)
 Stable on omeprazole, continue same.

## 2023-10-08 NOTE — Progress Notes (Signed)
 Subjective:     Patient ID: Judith Flynn, female    DOB: [birth year], 83 y.o.   MRN: [id number]  Chief Complaint  Patient presents with   Hypertension    Here for follow up   Anxiety    Here for follow up    Hypertension Associated symptoms include anxiety.  Anxiety      Discussed the use of AI scribe software for clinical note transcription with the patient, who gave verbal consent to proceed.  History of Present Illness   Judith Flynn is an 83 year old female who presents for a follow-up on medications and routine care.  She mentions experiencing a sensation of swelling in her neck, which she feels has been increasing. She is unsure about the nature of this swelling and requests it to be checked.  She is currently taking atorvastatin 40 mg for cholesterol management. Her last cholesterol check was over a year ago, and it was noted to be good at that time.  She receives annual Reclast infusions for osteoporosis, with the last infusion reportedly done in December.  She has a history of macular degeneration and follows up regularly with an ophthalmologist, Dr. Kennith Center, and also sees  doctor Radiochenko for glaucoma management.  Her A1c was last checked a year ago and was 6.1, slightly elevated but not in the diabetic range. She has maintained a steady weight of 110 lbs.  She takes Prilosec daily for reflux, which keeps her symptoms stable.  Her blood pressure is well-controlled at 117/63, and she takes hydrochlorothiazide for this.  She is on Lexapro 5 mg for anxiety and stress, which she finds helpful. She feels comfortable with the current dose, although she occasionally experiences agitation.  She confirms having received all her vaccinations, including the latest COVID and flu shots.    There are no preventive care reminders to display for this patient.  Past Medical History:  Diagnosis Date   Breast cancer (HCC) 2000   Left Breast Cancer   Cancer [facility])  2000   breast- left breast- s/p lumpectomy and radiation   Central retinal vein occlusion of left eye    diagnoses 1/25 Dr. Arlys John [facility]   Colon polyp    Hearing loss    Hyperglycemia    Hyperlipidemia    Hypertension    Hypertensive retinopathy    Dr. Arlys John   Macular degeneration    OU, followed by Dr. Arlys John 1/25   Osteoporosis    Personal history of radiation therapy 2000   Left Breast Cancer   Posterior vitreous detachment of left eye 07/2023   Dr. Arlys John   Tobacco abuse     Past Surgical History:  Procedure Laterality Date   BREAST BIOPSY Left 2000   BREAST LUMPECTOMY Left 2000   CATARACT EXTRACTION, BILATERAL Bilateral July and August 2015   CHOLECYSTECTOMY     surgery on ear drum     right    Family History  Problem Relation Age of Onset   Alcohol abuse Mother    Arthritis Mother    Hypertension Mother 62   Heart disease Mother        CHF   Alcohol abuse Father    Arthritis Father    Hypertension Father 48   Heart attack Father        Died of MI at age 85    Social History   Socioeconomic History   Marital status: Widowed  Spouse name: Not on file   Number of children: 3   Years of education: Not on file   Highest education level: GED or equivalent  Occupational History   Occupation: retired    Associate Professor: RETIRED  Tobacco Use   Smoking status: Former    Current packs/day: 0.00    Average packs/day: 0.5 packs/day for 50.0 years (25.0 ttl pk-yrs)    Types: Cigarettes    Start date: 10    Quit date: 2021    Years since quitting: 4.2   Smokeless tobacco: Never   Tobacco comments:    patient said she quit 10/2019  Vaping Use   Vaping status: Never Used  Substance and Sexual Activity   Alcohol use: Yes    Alcohol/week: 0.0 standard drinks of alcohol    Comment: Rare   Drug use: No   Sexual activity: Not Currently  Other Topics Concern   Not on file  Social History Narrative   Widow/  widower   Regular exercise- yes   Originally from [place]         Social Drivers of Health   Financial Resource Strain: Low Risk  (06/28/2023)   Overall Financial Resource Strain (CARDIA)    Difficulty of Paying Living Expenses: Not hard at all  Food Insecurity: No Food Insecurity (06/28/2023)   Hunger Vital Sign    Worried About Running Out of Food in the Last Year: Never true    Ran Out of Food in the Last Year: Never true  Transportation Needs: No Transportation Needs (06/28/2023)   PRAPARE - Administrator, Civil Service (Medical): No    Lack of Transportation (Non-Medical): No  Physical Activity: Inactive (06/28/2023)   Exercise Vital Sign    Days of Exercise per Week: 0 days    Minutes of Exercise per Session: 0 min  Stress: No Stress Concern Present (06/28/2023)   [employer] of Occupational Health - Occupational Stress Questionnaire    Feeling of Stress : Not at all  Recent Concern: Stress - Stress Concern Present (06/05/2023)   [employer] of Occupational Health - Occupational Stress Questionnaire    Feeling of Stress : To some extent  Social Connections: Socially Isolated (06/28/2023)   Social Connection and Isolation Panel [NHANES]    Frequency of Communication with Friends and Family: More than three times a week    Frequency of Social Gatherings with Friends and Family: More than three times a week    Attends Religious Services: Never    Database administrator or Organizations: No    Attends Banker Meetings: Never    Marital Status: Widowed  Intimate Partner Violence: Not At Risk (06/28/2023)   Humiliation, Afraid, Rape, and Kick questionnaire    Fear of Current or Ex-Partner: No    Emotionally Abused: No    Physically Abused: No    Sexually Abused: No    Outpatient Medications Prior to Visit  Medication Sig Dispense Refill   acetaminophen (TYLENOL) 325 MG tablet Take 650 mg by mouth every 6 (six) hours as needed.      atorvastatin (LIPITOR) 40 MG tablet TAKE ONE (1) TABLET BY MOUTH EVERY DAY 90 tablet 3   Calcium Carbonate-Vit D-Min (CALCIUM 1200) 1200-1000 MG-UNIT CHEW Chew 1 tablet by mouth daily.     Cholecalciferol (VITAMIN D3) 3000 units TABS Take 1 tablet by mouth daily. 30 tablet    dorzolamide-timolol (COSOPT) 2-0.5 % ophthalmic solution Place 1 drop into the right eye 2 (  two) times daily.     latanoprost (XALATAN) 0.005 % ophthalmic solution Place 1 drop into both eyes at bedtime.     Multiple Vitamins-Minerals (PRESERVISION AREDS 2) CAPS Take 1 capsule by mouth daily.     zoledronic acid (RECLAST) 5 MG/100ML SOLN injection Inject 5 mg into the vein. Annual infusion     escitalopram (LEXAPRO) 5 MG tablet Take 1 tablet (5 mg total) by mouth daily. 90 tablet 1   hydrochlorothiazide (HYDRODIURIL) 25 MG tablet TAKE ONE (1) TABLET BY MOUTH EVERY DAY 90 tablet 1   omeprazole (PRILOSEC) 40 MG capsule TAKE ONE CAPSULE BY MOUTH DAILY 90 capsule 1   No facility-administered medications prior to visit.    Allergies  Allergen Reactions   Prolia [Denosumab] Other (See Comments)    Jaw pain and tightness. Hard to open mouth.   Bupropion Other (See Comments)    tremors   Lisinopril Cough    ROS See HPI    Objective:    Physical Exam Constitutional:      General: She is not in acute distress.    Appearance: Normal appearance. She is well-developed.  HENT:     Head: Normocephalic and atraumatic.     Right Ear: External ear normal.     Left Ear: External ear normal.  Eyes:     General: No scleral icterus. Neck:     Thyroid: No thyromegaly.  Cardiovascular:     Rate and Rhythm: Normal rate and regular rhythm.     Heart sounds: Normal heart sounds. No murmur heard. Pulmonary:     Effort: Pulmonary effort is normal. No respiratory distress.     Breath sounds: Normal breath sounds. No wheezing.  Musculoskeletal:     Cervical back: Neck supple. No tenderness.  Lymphadenopathy:     Cervical: No  cervical adenopathy.  Skin:    General: Skin is warm and dry.  Neurological:     Mental Status: She is alert and oriented to person, place, and time.  Psychiatric:        Mood and Affect: Mood normal.        Behavior: Behavior normal.        Thought Content: Thought content normal.        Judgment: Judgment normal.      BP 117/63 (BP Location: Right Arm, Patient Position: Sitting, Cuff Size: Small)   Pulse 64   Temp 98.7 F (37.1 C) (Oral)   Resp 16   Ht 4\' 9"  (1.448 m)   Wt 110 lb (49.9 kg)   SpO2 97%   BMI 23.80 kg/m  Wt Readings from Last 3 Encounters:  10/08/23 110 lb (49.9 kg)  07/09/23 110 lb 12.8 oz (50.3 kg)  07/04/23 110 lb (49.9 kg)       Assessment & Flynn:   Problem List Items Addressed This Visit       Unprioritized   Osteoporosis   Stable, continues annual reclast with oncology.       Macular degeneration   She follows with Dr. Kennith Center and Radiochenko (for glaucoma).      Hyperlipidemia   Maintained on atorvastatin 40mg .  Lab Results  Component Value Date   CHOL 157 01/16/2022   HDL 59.30 01/16/2022   LDLCALC 86 01/16/2022   LDLDIRECT 100.0 02/19/2015   TRIG 59.0 01/16/2022   CHOLHDL 3 01/16/2022   Will update lipid panel.       Relevant Medications   hydrochlorothiazide (HYDRODIURIL) 25 MG tablet   Other Relevant  Orders   Lipid panel   Hyperglycemia   Lab Results  Component Value Date   HGBA1C 6.1 07/25/2022   Mild elevation.  Will update today.       Relevant Orders   HgB A1c   GERD (gastroesophageal reflux disease)   Stable on omeprazole, continue same.       Relevant Medications   omeprazole (PRILOSEC) 40 MG capsule   Essential hypertension - Primary   BP Readings from Last 3 Encounters:  10/08/23 117/63  07/09/23 (!) 136/54  07/04/23 138/62   Stable on hydrochlorothiazide, continue same.       Relevant Medications   hydrochlorothiazide (HYDRODIURIL) 25 MG tablet   Other Relevant Orders   Basic Metabolic Panel  (BMET)   Anxiety and depression   Stable on lexapro 5mg . Continue same.       Relevant Medications   escitalopram (LEXAPRO) 5 MG tablet    I have changed Liam Rogers. Seib "Louise"'s hydrochlorothiazide. I am also having her maintain her Calcium 1200, zoledronic acid, acetaminophen, PreserVision AREDS 2, Vitamin D3, latanoprost, atorvastatin, dorzolamide-timolol, escitalopram, and omeprazole.  Meds ordered this encounter  Medications   escitalopram (LEXAPRO) 5 MG tablet    Sig: Take 1 tablet (5 mg total) by mouth daily.    Dispense:  90 tablet    Refill:  1   omeprazole (PRILOSEC) 40 MG capsule    Sig: TAKE ONE CAPSULE BY MOUTH DAILY    Dispense:  90 capsule    Refill:  1   hydrochlorothiazide (HYDRODIURIL) 25 MG tablet    Sig: Take 1 tablet (25 mg total) by mouth daily.    Dispense:  90 tablet    Refill:  1

## 2023-10-08 NOTE — Assessment & Plan Note (Signed)
 She follows with Dr. Kennith Center and Radiochenko (for glaucoma).

## 2023-10-08 NOTE — Telephone Encounter (Signed)
 See order(s).

## 2023-10-09 NOTE — Telephone Encounter (Signed)
 Called patient a few times at home and cell, no answer

## 2023-10-10 MED ORDER — POTASSIUM CHLORIDE CRYS ER 20 MEQ PO TBCR: 20.0000 meq | EXTENDED_RELEASE_TABLET | Freq: Every day | ORAL | 1 refills | Status: AC

## 2023-10-10 NOTE — Telephone Encounter (Signed)
 Patient notified of results and provider's comments. She will pick up potasium today and scheduled for labs next week

## 2023-10-16 ENCOUNTER — Encounter: Payer: Self-pay | Admitting: Family

## 2023-10-16 ENCOUNTER — Other Ambulatory Visit (INDEPENDENT_AMBULATORY_CARE_PROVIDER_SITE_OTHER)

## 2023-10-16 DIAGNOSIS — E876 Hypokalemia: Secondary | ICD-10-CM | POA: Diagnosis not present

## 2023-10-16 DIAGNOSIS — E119 Type 2 diabetes mellitus without complications: Secondary | ICD-10-CM | POA: Diagnosis not present

## 2023-10-16 LAB — MICROALBUMIN / CREATININE URINE RATIO
Creatinine,U: 88.4 mg/dL
Microalb Creat Ratio: UNDETERMINED mg/g (ref 0.0–30.0)
Microalb, Ur: <0.7 mg/dL

## 2023-10-16 LAB — BASIC METABOLIC PANEL
BUN: 19 mg/dL (ref 6–23)
CO2: 30 meq/L (ref 19–32)
Calcium: 9.5 mg/dL (ref 8.4–10.5)
Chloride: 101 meq/L (ref 96–112)
Creatinine, Ser: 1.07 mg/dL (ref 0.40–1.20)
GFR: 47.79 mL/min — ABNORMAL LOW (ref >60.00)
Glucose, Bld: 96 mg/dL (ref 70–99)
Potassium: 3.9 meq/L (ref 3.5–5.1)
Sodium: 139 meq/L (ref 135–145)

## 2023-10-18 ENCOUNTER — Other Ambulatory Visit: Payer: Self-pay | Admitting: Family

## 2023-10-18 MED ORDER — HYDROCHLOROTHIAZIDE 25 MG PO TABS: 25.0000 mg | ORAL_TABLET | Freq: Every day | ORAL | 1 refills | Status: AC

## 2023-11-01 DIAGNOSIS — H35033 Hypertensive retinopathy, bilateral: Secondary | ICD-10-CM | POA: Diagnosis not present

## 2023-11-01 DIAGNOSIS — Z961 Presence of intraocular lens: Secondary | ICD-10-CM | POA: Diagnosis not present

## 2023-11-01 DIAGNOSIS — H401121 Primary open-angle glaucoma, left eye, mild stage: Secondary | ICD-10-CM | POA: Diagnosis not present

## 2023-11-01 DIAGNOSIS — H524 Presbyopia: Secondary | ICD-10-CM | POA: Diagnosis not present

## 2023-11-01 DIAGNOSIS — H353231 Exudative age-related macular degeneration, bilateral, with active choroidal neovascularization: Secondary | ICD-10-CM | POA: Diagnosis not present

## 2023-11-01 DIAGNOSIS — H401112 Primary open-angle glaucoma, right eye, moderate stage: Secondary | ICD-10-CM | POA: Diagnosis not present

## 2023-11-01 DIAGNOSIS — H43813 Vitreous degeneration, bilateral: Secondary | ICD-10-CM | POA: Diagnosis not present

## 2023-11-01 DIAGNOSIS — H52203 Unspecified astigmatism, bilateral: Secondary | ICD-10-CM | POA: Diagnosis not present

## 2023-11-01 DIAGNOSIS — H5203 Hypermetropia, bilateral: Secondary | ICD-10-CM | POA: Diagnosis not present

## 2023-11-07 ENCOUNTER — Other Ambulatory Visit (HOSPITAL_BASED_OUTPATIENT_CLINIC_OR_DEPARTMENT_OTHER): Payer: Self-pay | Admitting: Family

## 2023-11-07 DIAGNOSIS — Z1231 Encounter for screening mammogram for malignant neoplasm of breast: Secondary | ICD-10-CM

## 2023-11-08 DIAGNOSIS — H348122 Central retinal vein occlusion, left eye, stable: Secondary | ICD-10-CM | POA: Diagnosis not present

## 2023-11-08 DIAGNOSIS — H401134 Primary open-angle glaucoma, bilateral, indeterminate stage: Secondary | ICD-10-CM | POA: Diagnosis not present

## 2023-11-08 DIAGNOSIS — H43813 Vitreous degeneration, bilateral: Secondary | ICD-10-CM | POA: Diagnosis not present

## 2023-11-08 DIAGNOSIS — H35033 Hypertensive retinopathy, bilateral: Secondary | ICD-10-CM | POA: Diagnosis not present

## 2023-11-08 DIAGNOSIS — H353123 Nonexudative age-related macular degeneration, left eye, advanced atrophic without subfoveal involvement: Secondary | ICD-10-CM | POA: Diagnosis not present

## 2023-11-08 DIAGNOSIS — H353114 Nonexudative age-related macular degeneration, right eye, advanced atrophic with subfoveal involvement: Secondary | ICD-10-CM | POA: Diagnosis not present

## 2023-11-08 DIAGNOSIS — H43823 Vitreomacular adhesion, bilateral: Secondary | ICD-10-CM | POA: Diagnosis not present

## 2023-11-13 ENCOUNTER — Ambulatory Visit (HOSPITAL_BASED_OUTPATIENT_CLINIC_OR_DEPARTMENT_OTHER)
Admission: RE | Admit: 2023-11-13 | Discharge: 2023-11-13 | Disposition: A | Discharge status: Home / Self Care | Source: Ambulatory Visit | Attending: Family | Admitting: Family

## 2023-11-13 ENCOUNTER — Encounter (HOSPITAL_BASED_OUTPATIENT_CLINIC_OR_DEPARTMENT_OTHER): Payer: Self-pay

## 2023-11-13 DIAGNOSIS — Z1231 Encounter for screening mammogram for malignant neoplasm of breast: Secondary | ICD-10-CM | POA: Insufficient documentation

## 2023-11-15 ENCOUNTER — Encounter: Payer: Self-pay | Admitting: Family

## 2024-02-18 ENCOUNTER — Other Ambulatory Visit: Payer: Self-pay | Admitting: Family

## 2024-04-02 ENCOUNTER — Other Ambulatory Visit (HOSPITAL_BASED_OUTPATIENT_CLINIC_OR_DEPARTMENT_OTHER): Payer: Self-pay

## 2024-04-07 ENCOUNTER — Telehealth: Payer: Self-pay | Admitting: *Deleted

## 2024-04-07 ENCOUNTER — Other Ambulatory Visit (HOSPITAL_BASED_OUTPATIENT_CLINIC_OR_DEPARTMENT_OTHER): Payer: Self-pay

## 2024-04-07 MED ORDER — FLUZONE HIGH-DOSE 0.5 ML IM SUSY
0.5000 mL | PREFILLED_SYRINGE | Freq: Once | INTRAMUSCULAR | 0 refills | Status: AC
  Filled 2024-04-07: qty 0.5, 1d supply, fill #0

## 2024-04-07 MED ORDER — COMIRNATY 30 MCG/0.3ML IM SUSY
0.3000 mL | PREFILLED_SYRINGE | Freq: Once | INTRAMUSCULAR | 0 refills | Status: AC
  Filled 2024-04-07: qty 0.3, 1d supply, fill #0

## 2024-04-07 NOTE — Telephone Encounter (Signed)
 Copied from CRM [id number]. Topic: Clinical - Medication Question >> Apr 07, 2024  8:34 AM Willma SAUNDERS wrote: Reason for CRM: Covid Vaccine Prescription Request Brand Name: Pfizer, but is okay with any Pharmacy: [facility] [address] 1st floor [address] [place], [place] [address] [phone]  Patient can be reached at [phone]

## 2024-04-07 NOTE — Telephone Encounter (Signed)
 Message left with patient's sister linda to let them know they can get the covid vaccine at any [place] pharmacies, no prescription needed.

## 2024-04-09 ENCOUNTER — Ambulatory Visit: Admitting: Family

## 2024-05-08 DIAGNOSIS — H401112 Primary open-angle glaucoma, right eye, moderate stage: Secondary | ICD-10-CM | POA: Diagnosis not present

## 2024-05-08 DIAGNOSIS — H353232 Exudative age-related macular degeneration, bilateral, with inactive choroidal neovascularization: Secondary | ICD-10-CM | POA: Diagnosis not present

## 2024-05-08 DIAGNOSIS — H401121 Primary open-angle glaucoma, left eye, mild stage: Secondary | ICD-10-CM | POA: Diagnosis not present

## 2024-05-08 DIAGNOSIS — H353132 Nonexudative age-related macular degeneration, bilateral, intermediate dry stage: Secondary | ICD-10-CM | POA: Diagnosis not present

## 2024-05-12 DIAGNOSIS — H43813 Vitreous degeneration, bilateral: Secondary | ICD-10-CM | POA: Diagnosis not present

## 2024-05-12 DIAGNOSIS — H35033 Hypertensive retinopathy, bilateral: Secondary | ICD-10-CM | POA: Diagnosis not present

## 2024-05-12 DIAGNOSIS — H43823 Vitreomacular adhesion, bilateral: Secondary | ICD-10-CM | POA: Diagnosis not present

## 2024-05-12 DIAGNOSIS — H353123 Nonexudative age-related macular degeneration, left eye, advanced atrophic without subfoveal involvement: Secondary | ICD-10-CM | POA: Diagnosis not present

## 2024-05-12 DIAGNOSIS — H348122 Central retinal vein occlusion, left eye, stable: Secondary | ICD-10-CM | POA: Diagnosis not present

## 2024-05-12 DIAGNOSIS — H401134 Primary open-angle glaucoma, bilateral, indeterminate stage: Secondary | ICD-10-CM | POA: Diagnosis not present

## 2024-05-12 DIAGNOSIS — H353114 Nonexudative age-related macular degeneration, right eye, advanced atrophic with subfoveal involvement: Secondary | ICD-10-CM | POA: Diagnosis not present

## 2024-05-26 ENCOUNTER — Other Ambulatory Visit: Payer: Self-pay | Admitting: Family

## 2024-06-23 ENCOUNTER — Encounter: Payer: Self-pay | Admitting: Family

## 2024-07-08 LAB — OPHTHALMOLOGY REPORT-SCANNED

## 2024-07-09 ENCOUNTER — Ambulatory Visit

## 2024-07-09 ENCOUNTER — Ambulatory Visit: Payer: Self-pay

## 2024-07-09 ENCOUNTER — Ambulatory Visit (HOSPITAL_BASED_OUTPATIENT_CLINIC_OR_DEPARTMENT_OTHER)
Admission: RE | Admit: 2024-07-09 | Discharge: 2024-07-09 | Disposition: A | Discharge status: Home / Self Care | Source: Ambulatory Visit

## 2024-07-09 VITALS — BP 128/80 | Ht <= 58 in | Wt 110.0 lb

## 2024-07-09 DIAGNOSIS — S32020A Wedge compression fracture of second lumbar vertebra, initial encounter for closed fracture: Secondary | ICD-10-CM

## 2024-07-09 DIAGNOSIS — M545 Low back pain, unspecified: Secondary | ICD-10-CM

## 2024-07-09 MED ORDER — NAPROXEN 500 MG PO TABS: 500.0000 mg | ORAL_TABLET | Freq: Two times a day (BID) | ORAL | 1 refills | Status: AC

## 2024-07-09 NOTE — Progress Notes (Addendum)
 Subjective:    Patient ID: Judith Flynn, female    DOB: 83 y.o., 1940/08/30   MRN: [id number]  Chief Complaint: Back pain  Discussed the use of AI scribe software for clinical note transcription with the patient, who gave verbal consent to proceed.  History of Present Illness Judith Flynn is an 83 year old female with past medical history significant for hypertension, type 2 diabetes (last A1c 6.5 on 10/08/2023), osteoporosis, anxiety, depression presenting for evaluation of back pain.  Acute low back pain - Onset 9 days ago after assisting her son who had fallen - Sudden, severe pain with a distinct snap or crack in the lower lumbar region - Pain is persistent and worsened by coughing, laughing, and rising from a seated position - Unable to bend forward normally - Lying on left side against the back of the couch provides significant relief and allows sleep - Sitting in a recliner or riding in a car is tolerated - Bowel movements provide temporary relief  Right lower extremity sensory changes - Intermittent numbness from knee to ankle, mainly posteriorly - No radiation below the ankle or into the foot  Functional limitations - Ambulatory with a walker but limited to essential activities  Lower extremity edema - Transient bilateral leg swelling with dependent positioning - Improved with elevation  Prior lumbar injury - Similar low back injury in her thirties with prolonged right leg weakness and pain - Symptoms resolved over a year without surgery or physical therapy after orthopedist advised against surgery  Current management and response - Using a back brace and heating pad without significant relief - Taking over-the-counter anti-inflammatory medications, which are tolerated   Review of Pertinent Imaging: None currently.    Objective:   There were no vitals filed for this visit.  Lumbar Spine -Inspection: no swelling or skin changes -Palpation: TTP + midline at L5,  + paraspinals just above the iliac crest on the right side, + SI joint. -AROM/PROM: FROM in all planes of the low back -Strength: full hip flexion (L1/L2), knee extension (L3/4), ankle dorsiflexion (L4/5), hip extension (L5/S1), knee flexion (L5/S1/S2) plantarflexion (S1/2). -Sensation: intact sensation over the medial femoral condyle (L3), patella (L4), lateral femoral condyle (L5), lateral malleolus (S1). -Reflexes: normal patellar (L3/4), hamstring (L5/S1), achilles (S1/2) reflexes, equal bilaterally -Special tests: Equivocal straight Leg Raise, unable to tolerate stork, - Slump test      Assessment & Plan:   Assessment & Plan Acute low back pain, rule out lumbar compression fracture She developed acute low back pain after a lifting injury, with immediate onset and focal lumbar tenderness. Given her age and the mechanism of injury, a lumbar compression fracture is a significant concern, although lumbar strain is also possible. Current symptoms do not require surgical intervention. The primary goal is to exclude a fracture and manage pain and functional limitations. Lumbar spine radiographs have been ordered to evaluate for a compression fracture. She is prescribed nonsteroidal anti-inflammatory medication for pain relief and to reduce muscle inflammation. If imaging excludes a fracture, she will be referred to physical therapy for pain management and functional rehabilitation. She is advised to report any worsening pain or new neurological symptoms. If her pain worsens or does not improve, advanced imaging such as MRI will be considered. Radiographic results will be communicated by phone.  Lab Results  Component Value Date   CREATININE 1.07 10/16/2023    ADDENDUM: Vertebral compression fracture visualized at L2.  Continue current anti-inflammatory regimen for pain management.  Recommended patient follow-up  in ~2 weeks and if pain not well-controlled with current management strategy to call  clinic.  Likely plan short course of opioids if so.  Considered initiating calcitonin, but given that patient is currently receiving zoledronic  acid, do not want to cause hypocalcemia and given that patient cannot drive, she is not able to readily obtain lab work following her calcium  to ensure this medication safety.

## 2024-07-09 NOTE — Addendum Note (Signed)
 Addended by: CHARLES ROGUE A on: 07/09/2024 06:04 PM   Modules accepted: Level of Service

## 2024-07-10 DIAGNOSIS — S32020A Wedge compression fracture of second lumbar vertebra, initial encounter for closed fracture: Secondary | ICD-10-CM

## 2024-07-10 DIAGNOSIS — M545 Low back pain, unspecified: Secondary | ICD-10-CM

## 2024-07-14 ENCOUNTER — Inpatient Hospital Stay

## 2024-07-14 ENCOUNTER — Inpatient Hospital Stay: Admitting: Hematology & Oncology

## 2024-07-23 ENCOUNTER — Telehealth: Payer: Self-pay | Admitting: *Deleted

## 2024-07-23 NOTE — Telephone Encounter (Signed)
 Pt informed of below. She will discuss this at her 07/29/24 visit with Dr. Charles.  Received: Today Gottwalt, Redell LABOR, DO  Marcine Harlene SAILOR, CMA Thanks for checking on this, Harlene.  Please just call the patient, let them know this, ask them one last time whether they want a formal PT referral or not, and just a quick note in the chart stating that you reached out to them about no HHPT coverage, offered PT, and their response to this offer. Thank you! - Dr. Charles       Previous Messages    ----- Message ----- From: Marcine Harlene SAILOR, CMA Sent: 07/14/2024   4:02 PM EST To: Redell LABOR Charles, DO Subject: FW: HH PT                                      Home Health is not covered by her ins. [employer] plans do not reimburse for any home health PT. ----- Message ----- From: Koleen Needles Sent: 07/14/2024   3:46 PM EST To: Harlene SAILOR Marcine, CMA Subject: RE: [facility] PT                                      Walterine Harlene - thank you for your patience. I'm on PTO - I'm sorry but both of those patients you sent are both out of network and we cannot accept. You may have luck with [facility] or [facility]?

## 2024-07-28 ENCOUNTER — Telehealth: Payer: Self-pay | Admitting: Family

## 2024-07-28 NOTE — Telephone Encounter (Signed)
 Copied from CRM [id number]. Topic: Medicare AWV >> Jul 28, 2024  9:49 AM Nathanel DEL wrote: Called LVM 07/28/2024 to sched AWVS. Please schedule AWVS in office.  Nathanel Paschal; Care Guide Ambulatory Clinical Support [place] [facility] Direct Dial: [phone]

## 2024-07-29 ENCOUNTER — Ambulatory Visit

## 2024-07-29 VITALS — BP 128/70 | Ht <= 58 in | Wt 110.0 lb

## 2024-07-29 DIAGNOSIS — M8000XD Age-related osteoporosis with current pathological fracture, unspecified site, subsequent encounter for fracture with routine healing: Secondary | ICD-10-CM | POA: Diagnosis not present

## 2024-07-29 DIAGNOSIS — S32020A Wedge compression fracture of second lumbar vertebra, initial encounter for closed fracture: Secondary | ICD-10-CM | POA: Diagnosis not present

## 2024-07-29 NOTE — Progress Notes (Addendum)
" ° °  Subjective:    Patient ID: Judith Flynn, female    DOB: 84 y.o., 11-12-1940   MRN: [id number]  Chief Complaint: L2 compression fracture follow-up (2 weeks)  Discussed the use of AI scribe software for clinical note transcription with the patient, who gave verbal consent to proceed.  History of Present Illness Judith Flynn is an 84 year old female with osteoporosis and recent right-sided lumbar vertebral fracture who presents for follow-up of pain and mobility.  Lumbar vertebral fracture symptoms and recovery - Right-sided lumbar pain has largely resolved, with only mild discomfort remaining. - No recurrence of significant pain since the fracture approximately six weeks ago. - Analgesics discontinued after four days due to diarrhea. - Ambulating independently without assistive devices. - Continues to wear lumbar brace full time. - Now feels well enough to consider driving.  Osteoporosis management - Reclast  infusion was canceled after the injury and has not yet been administered. - Current management consists of calcium  and vitamin D  supplementation.  Objective:   Vitals:   07/29/24 0925  BP: 128/70    Lumbar Spine -Inspection: no swelling or skin changes -Palpation: TTP - midline, - paraspinals - None antalgic gait.  Able to ambulate without the assistance of walker or cane.     Assessment & Plan:   Assessment & Plan Lumbar vertebral fracture She is recovering well from a right-sided lumbar vertebral fracture with significant improvement in pain and function at six weeks post-injury. The fracture is stabilized, and she experiences no pain, only mild discomfort, and is ambulating independently. Analgesics have been discontinued due to pain resolution and adverse effects. She is safe to increase activity, including driving, as tolerated. A trial period without a lumbar brace is recommended to assess comfort and function. She is cleared to resume driving given  her pain-free status and fracture stabilization. Guidance on safe showering, including the use of a shower chair and caregiver assistance, has been provided.  Osteoporosis She has osteoporosis with a prior fragility fracture and missed her annual Reclast  infusion due to injury. Currently, she is managed with calcium  and vitamin D  supplementation. A DEXA scan has been ordered to reassess bone mineral density. The plan is to review the DEXA results to determine whether to continue current management or consider resuming or changing antiresorptive therapy. She is advised to keep her upcoming appointment to discuss DEXA results and management options.   "

## 2024-08-20 ENCOUNTER — Ambulatory Visit

## 2024-08-20 ENCOUNTER — Other Ambulatory Visit (HOSPITAL_BASED_OUTPATIENT_CLINIC_OR_DEPARTMENT_OTHER)

## 2024-09-10 ENCOUNTER — Other Ambulatory Visit (HOSPITAL_BASED_OUTPATIENT_CLINIC_OR_DEPARTMENT_OTHER)

## 2024-09-11 ENCOUNTER — Ambulatory Visit
